# Patient Record
Sex: Female | Born: 1970 | Race: Black or African American | Hispanic: No | Marital: Single | Smoking: Never smoker
Health system: Southern US, Community
[De-identification: ages and names within clinical notes are randomized; demographics above are authoritative.]

## PROBLEM LIST (undated history)

## (undated) ENCOUNTER — Emergency Department (HOSPITAL_COMMUNITY): Admission: EM | Payer: Medicare Other

## (undated) DIAGNOSIS — I10 Essential (primary) hypertension: Secondary | ICD-10-CM

## (undated) DIAGNOSIS — R109 Unspecified abdominal pain: Secondary | ICD-10-CM

## (undated) DIAGNOSIS — E669 Obesity, unspecified: Secondary | ICD-10-CM

## (undated) DIAGNOSIS — S270XXA Traumatic pneumothorax, initial encounter: Secondary | ICD-10-CM

## (undated) DIAGNOSIS — B451 Cerebral cryptococcosis: Secondary | ICD-10-CM

## (undated) DIAGNOSIS — D500 Iron deficiency anemia secondary to blood loss (chronic): Secondary | ICD-10-CM

## (undated) DIAGNOSIS — M1990 Unspecified osteoarthritis, unspecified site: Secondary | ICD-10-CM

## (undated) DIAGNOSIS — F32A Depression, unspecified: Secondary | ICD-10-CM

## (undated) DIAGNOSIS — B20 Human immunodeficiency virus [HIV] disease: Secondary | ICD-10-CM

## (undated) DIAGNOSIS — F419 Anxiety disorder, unspecified: Secondary | ICD-10-CM

## (undated) DIAGNOSIS — Z9289 Personal history of other medical treatment: Secondary | ICD-10-CM

## (undated) DIAGNOSIS — N189 Chronic kidney disease, unspecified: Secondary | ICD-10-CM

## (undated) DIAGNOSIS — F329 Major depressive disorder, single episode, unspecified: Secondary | ICD-10-CM

## (undated) DIAGNOSIS — G43909 Migraine, unspecified, not intractable, without status migrainosus: Secondary | ICD-10-CM

## (undated) DIAGNOSIS — L739 Follicular disorder, unspecified: Secondary | ICD-10-CM

## (undated) DIAGNOSIS — N926 Irregular menstruation, unspecified: Secondary | ICD-10-CM

## (undated) HISTORY — DX: Follicular disorder, unspecified: L73.9

## (undated) HISTORY — PX: WISDOM TOOTH EXTRACTION: SHX21

## (undated) HISTORY — DX: Essential (primary) hypertension: I10

## (undated) HISTORY — DX: Irregular menstruation, unspecified: N92.6

## (undated) HISTORY — DX: Cerebral cryptococcosis: B45.1

## (undated) HISTORY — PX: HYSTEROSCOPY WITH D & C: SHX1775

## (undated) HISTORY — DX: Iron deficiency anemia secondary to blood loss (chronic): D50.0

## (undated) HISTORY — PX: COLONOSCOPY: SHX174

## (undated) HISTORY — DX: Unspecified osteoarthritis, unspecified site: M19.90

## (undated) HISTORY — DX: Unspecified abdominal pain: R10.9

## (undated) HISTORY — DX: Obesity, unspecified: E66.9

## (undated) HISTORY — DX: Human immunodeficiency virus (HIV) disease: B20

## (undated) HISTORY — DX: Chronic kidney disease, unspecified: N18.9

## (undated) HISTORY — PX: CHEST TUBE INSERTION: SHX231

## (undated) HISTORY — DX: Traumatic pneumothorax, initial encounter: S27.0XXA

## (undated) HISTORY — PX: TUBAL LIGATION: SHX77

## (undated) HISTORY — PX: HEMORRHOID BANDING: SHX5850

---

## 1996-06-07 ENCOUNTER — Encounter (INDEPENDENT_AMBULATORY_CARE_PROVIDER_SITE_OTHER): Payer: Self-pay | Admitting: *Deleted

## 1996-06-07 LAB — CONVERTED CEMR LAB
CD4 Count: 9 microliters
CD4 T Cell Abs: 9

## 1997-10-23 ENCOUNTER — Encounter: Admission: RE | Admit: 1997-10-23 | Discharge: 1997-10-23 | Payer: Self-pay | Admitting: Infectious Diseases

## 1997-11-17 ENCOUNTER — Emergency Department (HOSPITAL_COMMUNITY): Admission: AD | Admit: 1997-11-17 | Discharge: 1997-11-17 | Payer: Self-pay | Admitting: *Deleted

## 1997-12-14 ENCOUNTER — Emergency Department (HOSPITAL_COMMUNITY): Admission: EM | Admit: 1997-12-14 | Discharge: 1997-12-14 | Payer: Self-pay | Admitting: *Deleted

## 1997-12-16 ENCOUNTER — Encounter: Admission: RE | Admit: 1997-12-16 | Discharge: 1997-12-16 | Payer: Self-pay | Admitting: Infectious Diseases

## 1998-01-06 ENCOUNTER — Encounter: Admission: RE | Admit: 1998-01-06 | Discharge: 1998-01-06 | Payer: Self-pay | Admitting: Infectious Diseases

## 1998-02-14 ENCOUNTER — Encounter: Admission: RE | Admit: 1998-02-14 | Discharge: 1998-02-14 | Payer: Self-pay | Admitting: Infectious Diseases

## 1998-02-14 ENCOUNTER — Encounter: Payer: Self-pay | Admitting: Infectious Diseases

## 1998-02-14 ENCOUNTER — Ambulatory Visit (HOSPITAL_COMMUNITY): Admission: RE | Admit: 1998-02-14 | Discharge: 1998-02-14 | Payer: Self-pay | Admitting: *Deleted

## 1998-02-15 ENCOUNTER — Emergency Department (HOSPITAL_COMMUNITY): Admission: EM | Admit: 1998-02-15 | Discharge: 1998-02-15 | Payer: Self-pay | Admitting: Emergency Medicine

## 1998-02-17 ENCOUNTER — Encounter: Admission: RE | Admit: 1998-02-17 | Discharge: 1998-02-17 | Payer: Self-pay | Admitting: Infectious Diseases

## 1998-03-10 ENCOUNTER — Encounter: Admission: RE | Admit: 1998-03-10 | Discharge: 1998-03-10 | Payer: Self-pay | Admitting: Infectious Diseases

## 1998-03-25 ENCOUNTER — Encounter: Admission: RE | Admit: 1998-03-25 | Discharge: 1998-03-25 | Payer: Self-pay | Admitting: Infectious Diseases

## 1998-04-10 ENCOUNTER — Ambulatory Visit (HOSPITAL_COMMUNITY): Admission: RE | Admit: 1998-04-10 | Discharge: 1998-04-10 | Payer: Self-pay | Admitting: Infectious Diseases

## 1998-04-21 ENCOUNTER — Encounter: Admission: RE | Admit: 1998-04-21 | Discharge: 1998-04-21 | Payer: Self-pay | Admitting: Infectious Diseases

## 1998-05-22 ENCOUNTER — Encounter: Admission: RE | Admit: 1998-05-22 | Discharge: 1998-05-22 | Payer: Self-pay | Admitting: Infectious Diseases

## 1998-05-28 ENCOUNTER — Encounter: Admission: RE | Admit: 1998-05-28 | Discharge: 1998-05-28 | Payer: Self-pay | Admitting: Infectious Diseases

## 1998-06-11 ENCOUNTER — Encounter: Payer: Self-pay | Admitting: Infectious Diseases

## 1998-06-11 ENCOUNTER — Encounter: Admission: RE | Admit: 1998-06-11 | Discharge: 1998-06-11 | Payer: Self-pay | Admitting: Infectious Diseases

## 1998-06-11 ENCOUNTER — Ambulatory Visit (HOSPITAL_COMMUNITY): Admission: RE | Admit: 1998-06-11 | Discharge: 1998-06-11 | Payer: Self-pay | Admitting: Infectious Diseases

## 1998-06-27 ENCOUNTER — Encounter: Admission: RE | Admit: 1998-06-27 | Discharge: 1998-06-27 | Payer: Self-pay | Admitting: Infectious Diseases

## 1998-07-12 ENCOUNTER — Emergency Department (HOSPITAL_COMMUNITY): Admission: EM | Admit: 1998-07-12 | Discharge: 1998-07-12 | Payer: Self-pay | Admitting: Emergency Medicine

## 1998-07-14 ENCOUNTER — Ambulatory Visit (HOSPITAL_COMMUNITY): Admission: RE | Admit: 1998-07-14 | Discharge: 1998-07-14 | Payer: Self-pay | Admitting: Infectious Diseases

## 1998-07-14 ENCOUNTER — Encounter: Admission: RE | Admit: 1998-07-14 | Discharge: 1998-07-14 | Payer: Self-pay | Admitting: Infectious Diseases

## 1998-08-04 ENCOUNTER — Emergency Department (HOSPITAL_COMMUNITY): Admission: EM | Admit: 1998-08-04 | Discharge: 1998-08-04 | Payer: Self-pay

## 1998-08-14 ENCOUNTER — Emergency Department (HOSPITAL_COMMUNITY): Admission: EM | Admit: 1998-08-14 | Discharge: 1998-08-14 | Payer: Self-pay | Admitting: Emergency Medicine

## 1998-08-15 ENCOUNTER — Encounter: Payer: Self-pay | Admitting: Emergency Medicine

## 1998-08-21 ENCOUNTER — Other Ambulatory Visit: Admission: RE | Admit: 1998-08-21 | Discharge: 1998-08-21 | Payer: Self-pay | Admitting: Obstetrics

## 1998-09-12 ENCOUNTER — Emergency Department (HOSPITAL_COMMUNITY): Admission: EM | Admit: 1998-09-12 | Discharge: 1998-09-12 | Payer: Self-pay | Admitting: Emergency Medicine

## 1998-10-11 ENCOUNTER — Emergency Department (HOSPITAL_COMMUNITY): Admission: EM | Admit: 1998-10-11 | Discharge: 1998-10-11 | Payer: Self-pay | Admitting: Emergency Medicine

## 1998-10-27 ENCOUNTER — Ambulatory Visit (HOSPITAL_COMMUNITY): Admission: RE | Admit: 1998-10-27 | Discharge: 1998-10-27 | Payer: Self-pay | Admitting: Infectious Diseases

## 1998-10-27 ENCOUNTER — Encounter: Admission: RE | Admit: 1998-10-27 | Discharge: 1998-10-27 | Payer: Self-pay | Admitting: Infectious Diseases

## 1998-11-17 ENCOUNTER — Ambulatory Visit (HOSPITAL_COMMUNITY): Admission: RE | Admit: 1998-11-17 | Discharge: 1998-11-17 | Payer: Self-pay | Admitting: Infectious Diseases

## 1998-11-17 ENCOUNTER — Encounter: Admission: RE | Admit: 1998-11-17 | Discharge: 1998-11-17 | Payer: Self-pay | Admitting: Infectious Diseases

## 1998-11-18 ENCOUNTER — Encounter: Admission: RE | Admit: 1998-11-18 | Discharge: 1998-11-18 | Payer: Self-pay | Admitting: Infectious Diseases

## 1998-12-17 ENCOUNTER — Encounter: Admission: RE | Admit: 1998-12-17 | Discharge: 1998-12-17 | Payer: Self-pay | Admitting: Infectious Diseases

## 1999-01-05 ENCOUNTER — Encounter: Admission: RE | Admit: 1999-01-05 | Discharge: 1999-01-05 | Payer: Self-pay | Admitting: Infectious Diseases

## 1999-01-14 ENCOUNTER — Emergency Department (HOSPITAL_COMMUNITY): Admission: EM | Admit: 1999-01-14 | Discharge: 1999-01-14 | Payer: Self-pay

## 1999-01-22 ENCOUNTER — Emergency Department (HOSPITAL_COMMUNITY): Admission: EM | Admit: 1999-01-22 | Discharge: 1999-01-22 | Payer: Self-pay | Admitting: Emergency Medicine

## 1999-02-10 ENCOUNTER — Encounter: Admission: RE | Admit: 1999-02-10 | Discharge: 1999-02-10 | Payer: Self-pay | Admitting: Family Medicine

## 1999-02-10 ENCOUNTER — Emergency Department (HOSPITAL_COMMUNITY): Admission: EM | Admit: 1999-02-10 | Discharge: 1999-02-10 | Payer: Self-pay | Admitting: Emergency Medicine

## 1999-02-17 ENCOUNTER — Encounter: Admission: RE | Admit: 1999-02-17 | Discharge: 1999-02-17 | Payer: Self-pay | Admitting: Infectious Diseases

## 1999-02-17 ENCOUNTER — Ambulatory Visit (HOSPITAL_COMMUNITY): Admission: RE | Admit: 1999-02-17 | Discharge: 1999-02-17 | Payer: Self-pay | Admitting: Infectious Diseases

## 1999-02-24 ENCOUNTER — Emergency Department (HOSPITAL_COMMUNITY): Admission: EM | Admit: 1999-02-24 | Discharge: 1999-02-24 | Payer: Self-pay | Admitting: Emergency Medicine

## 1999-02-24 ENCOUNTER — Encounter: Payer: Self-pay | Admitting: Emergency Medicine

## 1999-03-04 ENCOUNTER — Encounter: Admission: RE | Admit: 1999-03-04 | Discharge: 1999-03-04 | Payer: Self-pay | Admitting: Infectious Diseases

## 1999-03-17 ENCOUNTER — Encounter: Admission: RE | Admit: 1999-03-17 | Discharge: 1999-03-17 | Payer: Self-pay | Admitting: Infectious Diseases

## 1999-03-18 ENCOUNTER — Encounter: Admission: RE | Admit: 1999-03-18 | Discharge: 1999-03-18 | Payer: Self-pay | Admitting: Obstetrics & Gynecology

## 1999-04-01 ENCOUNTER — Encounter: Admission: RE | Admit: 1999-04-01 | Discharge: 1999-04-01 | Payer: Self-pay | Admitting: Obstetrics & Gynecology

## 1999-04-15 ENCOUNTER — Inpatient Hospital Stay (HOSPITAL_COMMUNITY): Admission: RE | Admit: 1999-04-15 | Discharge: 1999-04-15 | Payer: Self-pay | Admitting: *Deleted

## 1999-04-15 ENCOUNTER — Inpatient Hospital Stay (HOSPITAL_COMMUNITY): Admission: AD | Admit: 1999-04-15 | Discharge: 1999-04-15 | Payer: Self-pay | Admitting: *Deleted

## 1999-04-22 ENCOUNTER — Encounter: Admission: RE | Admit: 1999-04-22 | Discharge: 1999-04-22 | Payer: Self-pay | Admitting: Obstetrics & Gynecology

## 1999-04-28 ENCOUNTER — Encounter: Payer: Self-pay | Admitting: *Deleted

## 1999-04-28 ENCOUNTER — Inpatient Hospital Stay (HOSPITAL_COMMUNITY): Admission: AD | Admit: 1999-04-28 | Discharge: 1999-04-28 | Payer: Self-pay | Admitting: *Deleted

## 1999-05-13 ENCOUNTER — Encounter: Admission: RE | Admit: 1999-05-13 | Discharge: 1999-05-13 | Payer: Self-pay | Admitting: Obstetrics & Gynecology

## 1999-05-26 ENCOUNTER — Ambulatory Visit (HOSPITAL_COMMUNITY): Admission: RE | Admit: 1999-05-26 | Discharge: 1999-05-26 | Payer: Self-pay | Admitting: Infectious Diseases

## 1999-05-26 ENCOUNTER — Encounter: Admission: RE | Admit: 1999-05-26 | Discharge: 1999-05-26 | Payer: Self-pay | Admitting: Infectious Diseases

## 1999-06-03 ENCOUNTER — Encounter: Admission: RE | Admit: 1999-06-03 | Discharge: 1999-06-03 | Payer: Self-pay | Admitting: Obstetrics

## 1999-06-09 ENCOUNTER — Ambulatory Visit (HOSPITAL_COMMUNITY): Admission: RE | Admit: 1999-06-09 | Discharge: 1999-06-09 | Payer: Self-pay | Admitting: Obstetrics

## 1999-06-24 ENCOUNTER — Encounter: Admission: RE | Admit: 1999-06-24 | Discharge: 1999-06-24 | Payer: Self-pay | Admitting: Obstetrics & Gynecology

## 1999-06-28 ENCOUNTER — Inpatient Hospital Stay (HOSPITAL_COMMUNITY): Admission: AD | Admit: 1999-06-28 | Discharge: 1999-06-28 | Payer: Self-pay | Admitting: Obstetrics

## 1999-07-01 ENCOUNTER — Encounter: Admission: RE | Admit: 1999-07-01 | Discharge: 1999-07-01 | Payer: Self-pay | Admitting: Obstetrics & Gynecology

## 1999-07-11 ENCOUNTER — Inpatient Hospital Stay (HOSPITAL_COMMUNITY): Admission: AD | Admit: 1999-07-11 | Discharge: 1999-07-11 | Payer: Self-pay | Admitting: Obstetrics

## 1999-07-13 ENCOUNTER — Encounter: Admission: RE | Admit: 1999-07-13 | Discharge: 1999-07-13 | Payer: Self-pay | Admitting: Internal Medicine

## 1999-07-15 ENCOUNTER — Encounter: Admission: RE | Admit: 1999-07-15 | Discharge: 1999-07-15 | Payer: Self-pay | Admitting: Infectious Diseases

## 1999-07-22 ENCOUNTER — Encounter: Admission: RE | Admit: 1999-07-22 | Discharge: 1999-07-22 | Payer: Self-pay | Admitting: Obstetrics & Gynecology

## 1999-08-12 ENCOUNTER — Encounter: Admission: RE | Admit: 1999-08-12 | Discharge: 1999-08-12 | Payer: Self-pay | Admitting: Obstetrics & Gynecology

## 1999-08-19 ENCOUNTER — Inpatient Hospital Stay (HOSPITAL_COMMUNITY): Admission: AD | Admit: 1999-08-19 | Discharge: 1999-08-22 | Payer: Self-pay | Admitting: Obstetrics

## 1999-08-19 ENCOUNTER — Encounter: Payer: Self-pay | Admitting: Obstetrics

## 1999-09-02 ENCOUNTER — Encounter: Admission: RE | Admit: 1999-09-02 | Discharge: 1999-09-02 | Payer: Self-pay | Admitting: Obstetrics & Gynecology

## 1999-09-02 ENCOUNTER — Encounter (HOSPITAL_COMMUNITY): Admission: RE | Admit: 1999-09-02 | Discharge: 1999-10-16 | Payer: Self-pay | Admitting: Obstetrics & Gynecology

## 1999-09-04 ENCOUNTER — Encounter: Admission: RE | Admit: 1999-09-04 | Discharge: 1999-12-03 | Payer: Self-pay | Admitting: Obstetrics

## 1999-09-14 ENCOUNTER — Encounter: Admission: RE | Admit: 1999-09-14 | Discharge: 1999-09-14 | Payer: Self-pay | Admitting: Infectious Diseases

## 1999-09-16 ENCOUNTER — Encounter: Admission: RE | Admit: 1999-09-16 | Discharge: 1999-09-16 | Payer: Self-pay | Admitting: Obstetrics & Gynecology

## 1999-09-16 ENCOUNTER — Observation Stay (HOSPITAL_COMMUNITY): Admission: AD | Admit: 1999-09-16 | Discharge: 1999-09-17 | Payer: Self-pay | Admitting: Obstetrics

## 1999-09-29 ENCOUNTER — Inpatient Hospital Stay (HOSPITAL_COMMUNITY): Admission: AD | Admit: 1999-09-29 | Discharge: 1999-09-29 | Payer: Self-pay | Admitting: *Deleted

## 1999-10-02 ENCOUNTER — Inpatient Hospital Stay (HOSPITAL_COMMUNITY): Admission: AD | Admit: 1999-10-02 | Discharge: 1999-10-02 | Payer: Self-pay | Admitting: Obstetrics & Gynecology

## 1999-10-04 ENCOUNTER — Inpatient Hospital Stay (HOSPITAL_COMMUNITY): Admission: AD | Admit: 1999-10-04 | Discharge: 1999-10-04 | Payer: Self-pay | Admitting: Obstetrics & Gynecology

## 1999-10-04 ENCOUNTER — Encounter: Payer: Self-pay | Admitting: Obstetrics & Gynecology

## 1999-10-14 ENCOUNTER — Inpatient Hospital Stay (HOSPITAL_COMMUNITY): Admission: AD | Admit: 1999-10-14 | Discharge: 1999-10-17 | Payer: Self-pay | Admitting: Obstetrics

## 1999-10-19 ENCOUNTER — Inpatient Hospital Stay (HOSPITAL_COMMUNITY): Admission: AD | Admit: 1999-10-19 | Discharge: 1999-10-19 | Payer: Self-pay | Admitting: Obstetrics

## 1999-10-21 ENCOUNTER — Inpatient Hospital Stay (HOSPITAL_COMMUNITY): Admission: AD | Admit: 1999-10-21 | Discharge: 1999-10-21 | Payer: Self-pay | Admitting: Obstetrics

## 1999-10-21 ENCOUNTER — Emergency Department (HOSPITAL_COMMUNITY): Admission: EM | Admit: 1999-10-21 | Discharge: 1999-10-21 | Payer: Self-pay | Admitting: Emergency Medicine

## 1999-11-16 ENCOUNTER — Encounter: Admission: RE | Admit: 1999-11-16 | Discharge: 1999-11-16 | Payer: Self-pay | Admitting: Infectious Diseases

## 1999-11-16 ENCOUNTER — Ambulatory Visit (HOSPITAL_COMMUNITY): Admission: RE | Admit: 1999-11-16 | Discharge: 1999-11-16 | Payer: Self-pay | Admitting: Infectious Diseases

## 2000-01-11 ENCOUNTER — Encounter: Admission: RE | Admit: 2000-01-11 | Discharge: 2000-01-11 | Payer: Self-pay | Admitting: Infectious Diseases

## 2000-02-10 ENCOUNTER — Ambulatory Visit (HOSPITAL_COMMUNITY): Admission: RE | Admit: 2000-02-10 | Discharge: 2000-02-10 | Payer: Self-pay | Admitting: Infectious Diseases

## 2000-02-10 ENCOUNTER — Encounter: Admission: RE | Admit: 2000-02-10 | Discharge: 2000-02-10 | Payer: Self-pay | Admitting: Infectious Diseases

## 2000-02-24 ENCOUNTER — Encounter: Admission: RE | Admit: 2000-02-24 | Discharge: 2000-02-24 | Payer: Self-pay | Admitting: Infectious Diseases

## 2000-05-13 ENCOUNTER — Emergency Department (HOSPITAL_COMMUNITY): Admission: EM | Admit: 2000-05-13 | Discharge: 2000-05-13 | Payer: Self-pay | Admitting: Emergency Medicine

## 2000-05-13 ENCOUNTER — Encounter: Payer: Self-pay | Admitting: Emergency Medicine

## 2000-07-25 ENCOUNTER — Ambulatory Visit (HOSPITAL_COMMUNITY): Admission: RE | Admit: 2000-07-25 | Discharge: 2000-07-25 | Payer: Self-pay | Admitting: Infectious Diseases

## 2000-07-25 ENCOUNTER — Encounter: Admission: RE | Admit: 2000-07-25 | Discharge: 2000-07-25 | Payer: Self-pay | Admitting: Infectious Diseases

## 2000-10-06 ENCOUNTER — Inpatient Hospital Stay (HOSPITAL_COMMUNITY): Admission: AD | Admit: 2000-10-06 | Discharge: 2000-10-06 | Payer: Self-pay | Admitting: Obstetrics & Gynecology

## 2000-10-10 ENCOUNTER — Encounter: Admission: RE | Admit: 2000-10-10 | Discharge: 2000-10-10 | Payer: Self-pay | Admitting: Infectious Diseases

## 2000-10-17 ENCOUNTER — Encounter: Admission: RE | Admit: 2000-10-17 | Discharge: 2001-01-15 | Payer: Self-pay | Admitting: Infectious Diseases

## 2000-11-05 ENCOUNTER — Emergency Department (HOSPITAL_COMMUNITY): Admission: EM | Admit: 2000-11-05 | Discharge: 2000-11-05 | Payer: Self-pay | Admitting: Emergency Medicine

## 2000-12-23 ENCOUNTER — Emergency Department (HOSPITAL_COMMUNITY): Admission: EM | Admit: 2000-12-23 | Discharge: 2000-12-24 | Payer: Self-pay | Admitting: Emergency Medicine

## 2001-01-30 ENCOUNTER — Encounter: Admission: RE | Admit: 2001-01-30 | Discharge: 2001-01-30 | Payer: Self-pay | Admitting: Infectious Diseases

## 2001-01-30 ENCOUNTER — Ambulatory Visit (HOSPITAL_COMMUNITY): Admission: RE | Admit: 2001-01-30 | Discharge: 2001-01-30 | Payer: Self-pay | Admitting: Infectious Diseases

## 2001-02-13 ENCOUNTER — Encounter: Admission: RE | Admit: 2001-02-13 | Discharge: 2001-02-13 | Payer: Self-pay | Admitting: Infectious Diseases

## 2001-03-01 ENCOUNTER — Encounter: Admission: RE | Admit: 2001-03-01 | Discharge: 2001-03-01 | Payer: Self-pay | Admitting: Internal Medicine

## 2001-07-03 ENCOUNTER — Ambulatory Visit (HOSPITAL_COMMUNITY): Admission: RE | Admit: 2001-07-03 | Discharge: 2001-07-03 | Payer: Self-pay | Admitting: Infectious Diseases

## 2001-07-03 ENCOUNTER — Encounter: Admission: RE | Admit: 2001-07-03 | Discharge: 2001-07-03 | Payer: Self-pay | Admitting: Internal Medicine

## 2001-07-17 ENCOUNTER — Encounter: Admission: RE | Admit: 2001-07-17 | Discharge: 2001-07-17 | Payer: Self-pay | Admitting: Infectious Diseases

## 2001-09-25 ENCOUNTER — Encounter: Payer: Self-pay | Admitting: *Deleted

## 2001-09-25 ENCOUNTER — Emergency Department (HOSPITAL_COMMUNITY): Admission: EM | Admit: 2001-09-25 | Discharge: 2001-09-25 | Payer: Self-pay | Admitting: *Deleted

## 2001-10-27 ENCOUNTER — Encounter: Payer: Self-pay | Admitting: Emergency Medicine

## 2001-10-27 ENCOUNTER — Emergency Department (HOSPITAL_COMMUNITY): Admission: EM | Admit: 2001-10-27 | Discharge: 2001-10-27 | Payer: Self-pay | Admitting: *Deleted

## 2001-10-30 ENCOUNTER — Emergency Department (HOSPITAL_COMMUNITY): Admission: EM | Admit: 2001-10-30 | Discharge: 2001-10-30 | Payer: Self-pay | Admitting: Emergency Medicine

## 2001-10-30 ENCOUNTER — Encounter: Payer: Self-pay | Admitting: Emergency Medicine

## 2001-11-09 ENCOUNTER — Encounter: Admission: RE | Admit: 2001-11-09 | Discharge: 2001-11-09 | Payer: Self-pay | Admitting: Infectious Diseases

## 2001-11-09 ENCOUNTER — Ambulatory Visit (HOSPITAL_COMMUNITY): Admission: RE | Admit: 2001-11-09 | Discharge: 2001-11-09 | Payer: Self-pay | Admitting: Infectious Diseases

## 2001-12-07 ENCOUNTER — Emergency Department (HOSPITAL_COMMUNITY): Admission: EM | Admit: 2001-12-07 | Discharge: 2001-12-07 | Payer: Self-pay

## 2001-12-11 ENCOUNTER — Encounter: Admission: RE | Admit: 2001-12-11 | Discharge: 2001-12-11 | Payer: Self-pay | Admitting: Infectious Diseases

## 2001-12-23 ENCOUNTER — Emergency Department (HOSPITAL_COMMUNITY): Admission: EM | Admit: 2001-12-23 | Discharge: 2001-12-24 | Payer: Self-pay | Admitting: Emergency Medicine

## 2002-01-30 ENCOUNTER — Encounter: Admission: RE | Admit: 2002-01-30 | Discharge: 2002-01-30 | Payer: Self-pay | Admitting: Infectious Diseases

## 2002-03-26 ENCOUNTER — Ambulatory Visit (HOSPITAL_COMMUNITY): Admission: RE | Admit: 2002-03-26 | Discharge: 2002-03-26 | Payer: Self-pay | Admitting: Infectious Diseases

## 2002-03-26 ENCOUNTER — Encounter: Admission: RE | Admit: 2002-03-26 | Discharge: 2002-03-26 | Payer: Self-pay | Admitting: Infectious Diseases

## 2002-03-29 ENCOUNTER — Encounter: Admission: RE | Admit: 2002-03-29 | Discharge: 2002-04-19 | Payer: Self-pay | Admitting: Family Medicine

## 2002-05-07 ENCOUNTER — Encounter: Admission: RE | Admit: 2002-05-07 | Discharge: 2002-05-07 | Payer: Self-pay | Admitting: Infectious Diseases

## 2002-08-01 ENCOUNTER — Encounter: Admission: RE | Admit: 2002-08-01 | Discharge: 2002-08-01 | Payer: Self-pay | Admitting: Infectious Diseases

## 2002-08-20 ENCOUNTER — Encounter: Admission: RE | Admit: 2002-08-20 | Discharge: 2002-08-20 | Payer: Self-pay | Admitting: Infectious Diseases

## 2002-12-29 ENCOUNTER — Encounter: Payer: Self-pay | Admitting: Family Medicine

## 2002-12-29 ENCOUNTER — Encounter: Admission: RE | Admit: 2002-12-29 | Discharge: 2002-12-29 | Payer: Self-pay | Admitting: Family Medicine

## 2003-01-01 ENCOUNTER — Encounter: Admission: RE | Admit: 2003-01-01 | Discharge: 2003-01-01 | Payer: Self-pay | Admitting: Infectious Diseases

## 2003-01-01 ENCOUNTER — Encounter (INDEPENDENT_AMBULATORY_CARE_PROVIDER_SITE_OTHER): Payer: Self-pay | Admitting: Infectious Diseases

## 2003-01-01 ENCOUNTER — Ambulatory Visit (HOSPITAL_COMMUNITY): Admission: RE | Admit: 2003-01-01 | Discharge: 2003-01-01 | Payer: Self-pay | Admitting: Infectious Diseases

## 2003-01-21 ENCOUNTER — Encounter: Admission: RE | Admit: 2003-01-21 | Discharge: 2003-01-21 | Payer: Self-pay | Admitting: Infectious Diseases

## 2003-02-08 ENCOUNTER — Encounter: Admission: RE | Admit: 2003-02-08 | Discharge: 2003-02-08 | Payer: Self-pay | Admitting: Infectious Diseases

## 2003-02-13 ENCOUNTER — Encounter: Admission: RE | Admit: 2003-02-13 | Discharge: 2003-02-13 | Payer: Self-pay | Admitting: Infectious Diseases

## 2003-03-18 ENCOUNTER — Encounter: Payer: Self-pay | Admitting: Emergency Medicine

## 2003-03-18 ENCOUNTER — Emergency Department (HOSPITAL_COMMUNITY): Admission: EM | Admit: 2003-03-18 | Discharge: 2003-03-19 | Payer: Self-pay | Admitting: Emergency Medicine

## 2003-03-19 ENCOUNTER — Encounter: Payer: Self-pay | Admitting: Emergency Medicine

## 2003-03-27 ENCOUNTER — Emergency Department (HOSPITAL_COMMUNITY): Admission: EM | Admit: 2003-03-27 | Discharge: 2003-03-27 | Payer: Self-pay | Admitting: Emergency Medicine

## 2003-03-27 ENCOUNTER — Encounter: Payer: Self-pay | Admitting: Emergency Medicine

## 2003-04-29 ENCOUNTER — Encounter: Admission: RE | Admit: 2003-04-29 | Discharge: 2003-04-29 | Payer: Self-pay | Admitting: Family Medicine

## 2003-06-26 ENCOUNTER — Encounter: Admission: RE | Admit: 2003-06-26 | Discharge: 2003-06-26 | Payer: Self-pay | Admitting: Infectious Diseases

## 2003-07-10 ENCOUNTER — Encounter: Admission: RE | Admit: 2003-07-10 | Discharge: 2003-07-10 | Payer: Self-pay | Admitting: Infectious Diseases

## 2003-07-16 ENCOUNTER — Emergency Department (HOSPITAL_COMMUNITY): Admission: EM | Admit: 2003-07-16 | Discharge: 2003-07-16 | Payer: Self-pay

## 2003-08-08 ENCOUNTER — Emergency Department (HOSPITAL_COMMUNITY): Admission: EM | Admit: 2003-08-08 | Discharge: 2003-08-08 | Payer: Self-pay

## 2003-08-12 ENCOUNTER — Encounter: Admission: RE | Admit: 2003-08-12 | Discharge: 2003-08-12 | Payer: Self-pay | Admitting: Infectious Diseases

## 2003-10-01 ENCOUNTER — Inpatient Hospital Stay (HOSPITAL_COMMUNITY): Admission: AD | Admit: 2003-10-01 | Discharge: 2003-10-01 | Payer: Self-pay | Admitting: Obstetrics

## 2003-10-05 ENCOUNTER — Inpatient Hospital Stay (HOSPITAL_COMMUNITY): Admission: AD | Admit: 2003-10-05 | Discharge: 2003-10-05 | Payer: Self-pay | Admitting: Obstetrics

## 2003-11-04 ENCOUNTER — Encounter: Admission: RE | Admit: 2003-11-04 | Discharge: 2003-11-04 | Payer: Self-pay | Admitting: Infectious Diseases

## 2003-11-04 ENCOUNTER — Ambulatory Visit (HOSPITAL_COMMUNITY): Admission: RE | Admit: 2003-11-04 | Discharge: 2003-11-04 | Payer: Self-pay | Admitting: Infectious Diseases

## 2003-11-18 ENCOUNTER — Encounter: Admission: RE | Admit: 2003-11-18 | Discharge: 2003-11-18 | Payer: Self-pay | Admitting: Infectious Diseases

## 2003-12-10 ENCOUNTER — Emergency Department (HOSPITAL_COMMUNITY): Admission: EM | Admit: 2003-12-10 | Discharge: 2003-12-11 | Payer: Self-pay | Admitting: Emergency Medicine

## 2004-01-09 ENCOUNTER — Encounter: Admission: RE | Admit: 2004-01-09 | Discharge: 2004-01-09 | Payer: Self-pay | Admitting: Family Medicine

## 2004-02-03 ENCOUNTER — Inpatient Hospital Stay (HOSPITAL_COMMUNITY): Admission: AD | Admit: 2004-02-03 | Discharge: 2004-02-03 | Payer: Self-pay | Admitting: Obstetrics

## 2004-03-05 ENCOUNTER — Inpatient Hospital Stay (HOSPITAL_COMMUNITY): Admission: AD | Admit: 2004-03-05 | Discharge: 2004-03-05 | Payer: Self-pay | Admitting: Obstetrics

## 2004-03-09 ENCOUNTER — Ambulatory Visit (HOSPITAL_COMMUNITY): Admission: RE | Admit: 2004-03-09 | Discharge: 2004-03-09 | Payer: Self-pay | Admitting: Infectious Diseases

## 2004-03-09 ENCOUNTER — Ambulatory Visit: Payer: Self-pay | Admitting: Infectious Diseases

## 2004-04-24 ENCOUNTER — Ambulatory Visit: Payer: Self-pay | Admitting: Infectious Diseases

## 2004-05-18 ENCOUNTER — Ambulatory Visit: Payer: Self-pay | Admitting: Infectious Diseases

## 2004-05-31 ENCOUNTER — Inpatient Hospital Stay (HOSPITAL_COMMUNITY): Admission: AD | Admit: 2004-05-31 | Discharge: 2004-05-31 | Payer: Self-pay | Admitting: Obstetrics

## 2004-06-25 ENCOUNTER — Inpatient Hospital Stay (HOSPITAL_COMMUNITY): Admission: AD | Admit: 2004-06-25 | Discharge: 2004-06-25 | Payer: Self-pay | Admitting: Obstetrics

## 2004-06-25 ENCOUNTER — Ambulatory Visit: Payer: Self-pay | Admitting: Family Medicine

## 2004-07-01 ENCOUNTER — Ambulatory Visit: Payer: Self-pay | Admitting: *Deleted

## 2004-07-06 ENCOUNTER — Ambulatory Visit: Payer: Self-pay | Admitting: Internal Medicine

## 2004-07-08 ENCOUNTER — Ambulatory Visit: Payer: Self-pay | Admitting: Infectious Diseases

## 2004-07-08 ENCOUNTER — Ambulatory Visit (HOSPITAL_COMMUNITY): Admission: RE | Admit: 2004-07-08 | Discharge: 2004-07-08 | Payer: Self-pay | Admitting: Infectious Diseases

## 2004-07-09 ENCOUNTER — Ambulatory Visit: Payer: Self-pay | Admitting: Family Medicine

## 2004-07-14 ENCOUNTER — Ambulatory Visit: Payer: Self-pay | Admitting: *Deleted

## 2004-07-22 ENCOUNTER — Ambulatory Visit: Payer: Self-pay | Admitting: *Deleted

## 2004-07-22 ENCOUNTER — Ambulatory Visit: Payer: Self-pay | Admitting: Infectious Diseases

## 2004-07-29 ENCOUNTER — Ambulatory Visit: Payer: Self-pay | Admitting: *Deleted

## 2004-07-29 ENCOUNTER — Ambulatory Visit (HOSPITAL_COMMUNITY): Admission: RE | Admit: 2004-07-29 | Discharge: 2004-07-29 | Payer: Self-pay | Admitting: Obstetrics & Gynecology

## 2004-08-02 ENCOUNTER — Inpatient Hospital Stay (HOSPITAL_COMMUNITY): Admission: AD | Admit: 2004-08-02 | Discharge: 2004-08-04 | Payer: Self-pay | Admitting: Family Medicine

## 2004-08-07 ENCOUNTER — Inpatient Hospital Stay (HOSPITAL_COMMUNITY): Admission: AD | Admit: 2004-08-07 | Discharge: 2004-08-07 | Payer: Self-pay | Admitting: Obstetrics and Gynecology

## 2004-08-12 ENCOUNTER — Ambulatory Visit: Payer: Self-pay | Admitting: *Deleted

## 2004-08-12 ENCOUNTER — Ambulatory Visit: Payer: Self-pay | Admitting: Obstetrics and Gynecology

## 2004-08-12 ENCOUNTER — Inpatient Hospital Stay (HOSPITAL_COMMUNITY): Admission: AD | Admit: 2004-08-12 | Discharge: 2004-08-14 | Payer: Self-pay | Admitting: Obstetrics and Gynecology

## 2004-08-17 ENCOUNTER — Ambulatory Visit: Payer: Self-pay | Admitting: *Deleted

## 2004-08-19 ENCOUNTER — Ambulatory Visit: Payer: Self-pay | Admitting: *Deleted

## 2004-08-20 ENCOUNTER — Inpatient Hospital Stay (HOSPITAL_COMMUNITY): Admission: AD | Admit: 2004-08-20 | Discharge: 2004-08-20 | Payer: Self-pay | Admitting: Obstetrics & Gynecology

## 2004-08-26 ENCOUNTER — Ambulatory Visit: Payer: Self-pay | Admitting: *Deleted

## 2004-09-01 ENCOUNTER — Inpatient Hospital Stay (HOSPITAL_COMMUNITY): Admission: AD | Admit: 2004-09-01 | Discharge: 2004-09-04 | Payer: Self-pay | Admitting: *Deleted

## 2004-09-01 ENCOUNTER — Ambulatory Visit: Payer: Self-pay | Admitting: *Deleted

## 2004-09-09 ENCOUNTER — Ambulatory Visit: Payer: Self-pay | Admitting: Obstetrics & Gynecology

## 2004-09-09 ENCOUNTER — Ambulatory Visit (HOSPITAL_COMMUNITY): Admission: RE | Admit: 2004-09-09 | Discharge: 2004-09-09 | Payer: Self-pay | Admitting: Obstetrics & Gynecology

## 2004-09-11 ENCOUNTER — Ambulatory Visit: Payer: Self-pay | Admitting: Obstetrics & Gynecology

## 2004-09-11 ENCOUNTER — Inpatient Hospital Stay (HOSPITAL_COMMUNITY): Admission: AD | Admit: 2004-09-11 | Discharge: 2004-09-15 | Payer: Self-pay | Admitting: *Deleted

## 2004-09-11 ENCOUNTER — Ambulatory Visit (HOSPITAL_COMMUNITY): Admission: RE | Admit: 2004-09-11 | Discharge: 2004-09-11 | Payer: Self-pay | Admitting: *Deleted

## 2004-09-11 ENCOUNTER — Ambulatory Visit: Payer: Self-pay | Admitting: *Deleted

## 2004-11-14 ENCOUNTER — Emergency Department (HOSPITAL_COMMUNITY): Admission: EM | Admit: 2004-11-14 | Discharge: 2004-11-14 | Payer: Self-pay | Admitting: Emergency Medicine

## 2005-02-15 ENCOUNTER — Ambulatory Visit: Payer: Self-pay | Admitting: Infectious Diseases

## 2005-02-15 ENCOUNTER — Ambulatory Visit (HOSPITAL_COMMUNITY): Admission: RE | Admit: 2005-02-15 | Discharge: 2005-02-15 | Payer: Self-pay | Admitting: Infectious Diseases

## 2005-03-08 ENCOUNTER — Ambulatory Visit: Payer: Self-pay | Admitting: Infectious Diseases

## 2005-03-25 ENCOUNTER — Encounter: Admission: RE | Admit: 2005-03-25 | Discharge: 2005-03-25 | Payer: Self-pay | Admitting: Family Medicine

## 2005-04-20 ENCOUNTER — Ambulatory Visit (HOSPITAL_COMMUNITY): Admission: RE | Admit: 2005-04-20 | Discharge: 2005-04-20 | Payer: Self-pay

## 2005-04-21 ENCOUNTER — Emergency Department (HOSPITAL_COMMUNITY): Admission: EM | Admit: 2005-04-21 | Discharge: 2005-04-21 | Payer: Self-pay | Admitting: Emergency Medicine

## 2005-05-05 ENCOUNTER — Ambulatory Visit (HOSPITAL_COMMUNITY): Admission: RE | Admit: 2005-05-05 | Discharge: 2005-05-05 | Payer: Self-pay | Admitting: Obstetrics

## 2005-06-30 ENCOUNTER — Encounter (INDEPENDENT_AMBULATORY_CARE_PROVIDER_SITE_OTHER): Payer: Self-pay | Admitting: *Deleted

## 2005-06-30 ENCOUNTER — Ambulatory Visit: Payer: Self-pay | Admitting: Infectious Diseases

## 2005-06-30 LAB — CONVERTED CEMR LAB: HIV 1 RNA Quant: 49 copies/mL

## 2005-07-14 ENCOUNTER — Ambulatory Visit: Payer: Self-pay | Admitting: Infectious Diseases

## 2005-10-23 ENCOUNTER — Encounter
Admission: RE | Admit: 2005-10-23 | Discharge: 2005-10-23 | Payer: Self-pay | Admitting: Physical Medicine and Rehabilitation

## 2005-10-25 ENCOUNTER — Ambulatory Visit (HOSPITAL_COMMUNITY)
Admission: RE | Admit: 2005-10-25 | Discharge: 2005-10-25 | Payer: Self-pay | Admitting: Physical Medicine and Rehabilitation

## 2005-10-25 ENCOUNTER — Encounter
Admission: RE | Admit: 2005-10-25 | Discharge: 2005-10-25 | Payer: Self-pay | Admitting: Physical Medicine and Rehabilitation

## 2005-12-23 ENCOUNTER — Encounter (INDEPENDENT_AMBULATORY_CARE_PROVIDER_SITE_OTHER): Payer: Self-pay | Admitting: *Deleted

## 2005-12-23 ENCOUNTER — Encounter: Admission: RE | Admit: 2005-12-23 | Discharge: 2005-12-23 | Payer: Self-pay | Admitting: Infectious Diseases

## 2005-12-23 ENCOUNTER — Ambulatory Visit: Payer: Self-pay | Admitting: Infectious Diseases

## 2005-12-23 LAB — CONVERTED CEMR LAB: HIV 1 RNA Quant: 49 copies/mL

## 2006-01-10 ENCOUNTER — Ambulatory Visit: Payer: Self-pay | Admitting: Infectious Diseases

## 2006-03-02 ENCOUNTER — Emergency Department (HOSPITAL_COMMUNITY): Admission: EM | Admit: 2006-03-02 | Discharge: 2006-03-02 | Payer: Self-pay | Admitting: Emergency Medicine

## 2006-03-11 DIAGNOSIS — L738 Other specified follicular disorders: Secondary | ICD-10-CM

## 2006-03-11 DIAGNOSIS — J019 Acute sinusitis, unspecified: Secondary | ICD-10-CM

## 2006-03-11 DIAGNOSIS — G43909 Migraine, unspecified, not intractable, without status migrainosus: Secondary | ICD-10-CM | POA: Insufficient documentation

## 2006-03-11 DIAGNOSIS — B451 Cerebral cryptococcosis: Secondary | ICD-10-CM | POA: Insufficient documentation

## 2006-03-11 DIAGNOSIS — G02 Meningitis in other infectious and parasitic diseases classified elsewhere: Secondary | ICD-10-CM

## 2006-03-11 DIAGNOSIS — B20 Human immunodeficiency virus [HIV] disease: Secondary | ICD-10-CM | POA: Insufficient documentation

## 2006-03-11 DIAGNOSIS — R7881 Bacteremia: Secondary | ICD-10-CM | POA: Insufficient documentation

## 2006-05-05 ENCOUNTER — Encounter (INDEPENDENT_AMBULATORY_CARE_PROVIDER_SITE_OTHER): Payer: Self-pay | Admitting: *Deleted

## 2006-05-05 ENCOUNTER — Ambulatory Visit: Payer: Self-pay | Admitting: Infectious Diseases

## 2006-05-05 ENCOUNTER — Encounter: Admission: RE | Admit: 2006-05-05 | Discharge: 2006-05-05 | Payer: Self-pay | Admitting: Infectious Diseases

## 2006-05-05 ENCOUNTER — Encounter (INDEPENDENT_AMBULATORY_CARE_PROVIDER_SITE_OTHER): Payer: Self-pay | Admitting: Infectious Diseases

## 2006-05-05 LAB — CONVERTED CEMR LAB
ALT: 9 units/L (ref 0–35)
Albumin: 4.3 g/dL (ref 3.5–5.2)
Basophils Absolute: 0 10*3/uL (ref 0.0–0.1)
CO2: 24 meq/L (ref 19–32)
Chloride: 103 meq/L (ref 96–112)
Glucose, Bld: 196 mg/dL — ABNORMAL HIGH (ref 70–99)
HIV 1 RNA Quant: <50 copies/mL (ref <50)
Lymphocytes Relative: 40 % (ref 15–43)
MCHC: 30.3 g/dL — ABNORMAL LOW (ref 33.1–35.4)
Monocytes Absolute: 0.4 10*3/uL (ref 0.2–0.7)
Neutro Abs: 2.7 10*3/uL (ref 1.8–6.8)
Neutrophils Relative %: 49 % (ref 47–77)
Platelets: 453 10*3/uL — ABNORMAL HIGH (ref 152–374)
Potassium: 4.1 meq/L (ref 3.5–5.3)
RDW: 14.7 % (ref 11.5–15.3)
Sodium: 138 meq/L (ref 135–145)
Total Bilirubin: 0.2 mg/dL — ABNORMAL LOW (ref 0.3–1.2)
Total Protein: 7.4 g/dL (ref 6.0–8.3)

## 2006-05-07 ENCOUNTER — Emergency Department (HOSPITAL_COMMUNITY): Admission: EM | Admit: 2006-05-07 | Discharge: 2006-05-07 | Payer: Self-pay | Admitting: Emergency Medicine

## 2006-05-08 ENCOUNTER — Emergency Department (HOSPITAL_COMMUNITY): Admission: EM | Admit: 2006-05-08 | Discharge: 2006-05-08 | Payer: Self-pay | Admitting: Emergency Medicine

## 2006-05-23 ENCOUNTER — Ambulatory Visit: Payer: Self-pay | Admitting: Infectious Diseases

## 2006-06-15 ENCOUNTER — Emergency Department (HOSPITAL_COMMUNITY): Admission: EM | Admit: 2006-06-15 | Discharge: 2006-06-16 | Payer: Self-pay | Admitting: Emergency Medicine

## 2006-07-29 ENCOUNTER — Emergency Department (HOSPITAL_COMMUNITY): Admission: EM | Admit: 2006-07-29 | Discharge: 2006-07-29 | Payer: Self-pay | Admitting: Emergency Medicine

## 2006-08-01 ENCOUNTER — Telehealth: Payer: Self-pay | Admitting: Infectious Diseases

## 2006-08-01 ENCOUNTER — Encounter (INDEPENDENT_AMBULATORY_CARE_PROVIDER_SITE_OTHER): Payer: Self-pay | Admitting: *Deleted

## 2006-08-01 LAB — CONVERTED CEMR LAB

## 2006-08-03 ENCOUNTER — Encounter (INDEPENDENT_AMBULATORY_CARE_PROVIDER_SITE_OTHER): Payer: Self-pay | Admitting: Infectious Diseases

## 2006-08-14 ENCOUNTER — Encounter (INDEPENDENT_AMBULATORY_CARE_PROVIDER_SITE_OTHER): Payer: Self-pay | Admitting: *Deleted

## 2006-09-12 ENCOUNTER — Emergency Department (HOSPITAL_COMMUNITY): Admission: EM | Admit: 2006-09-12 | Discharge: 2006-09-12 | Payer: Self-pay | Admitting: Emergency Medicine

## 2007-02-16 ENCOUNTER — Encounter: Admission: RE | Admit: 2007-02-16 | Discharge: 2007-02-16 | Payer: Self-pay | Admitting: Internal Medicine

## 2007-02-16 ENCOUNTER — Ambulatory Visit: Payer: Self-pay | Admitting: Internal Medicine

## 2007-02-16 LAB — CONVERTED CEMR LAB
HIV 1 RNA Quant: <50 copies/mL (ref <50)
HIV-1 RNA Quant, Log: <1.7 (ref <1.70)

## 2007-02-17 ENCOUNTER — Telehealth: Payer: Self-pay | Admitting: Internal Medicine

## 2007-02-17 LAB — CONVERTED CEMR LAB
BUN: 11 mg/dL (ref 6–23)
CO2: 23 meq/L (ref 19–32)
Creatinine, Ser: 0.84 mg/dL (ref 0.40–1.20)
Eosinophils Relative: 5 % (ref 0–5)
Glucose, Bld: 301 mg/dL — ABNORMAL HIGH (ref 70–99)
HCT: 34 % — ABNORMAL LOW (ref 36.0–46.0)
Hemoglobin: 10.2 g/dL — ABNORMAL LOW (ref 12.0–15.0)
Lymphocytes Relative: 39 % (ref 12–46)
Lymphs Abs: 2.2 10*3/uL (ref 0.7–3.3)
MCV: 70.1 fL — ABNORMAL LOW (ref 78.0–100.0)
Monocytes Absolute: 0.4 10*3/uL (ref 0.2–0.7)
Total Bilirubin: 0.4 mg/dL (ref 0.3–1.2)
WBC: 5.5 10*3/uL (ref 4.0–10.5)

## 2007-02-20 DIAGNOSIS — Z794 Long term (current) use of insulin: Secondary | ICD-10-CM

## 2007-02-20 DIAGNOSIS — E119 Type 2 diabetes mellitus without complications: Secondary | ICD-10-CM | POA: Insufficient documentation

## 2007-02-20 DIAGNOSIS — E1165 Type 2 diabetes mellitus with hyperglycemia: Secondary | ICD-10-CM

## 2007-03-08 ENCOUNTER — Ambulatory Visit: Payer: Self-pay | Admitting: Internal Medicine

## 2007-03-09 ENCOUNTER — Emergency Department (HOSPITAL_COMMUNITY): Admission: EM | Admit: 2007-03-09 | Discharge: 2007-03-09 | Payer: Self-pay | Admitting: Emergency Medicine

## 2007-04-05 ENCOUNTER — Emergency Department (HOSPITAL_COMMUNITY): Admission: EM | Admit: 2007-04-05 | Discharge: 2007-04-05 | Payer: Self-pay | Admitting: Emergency Medicine

## 2007-05-07 ENCOUNTER — Emergency Department (HOSPITAL_COMMUNITY): Admission: EM | Admit: 2007-05-07 | Discharge: 2007-05-08 | Payer: Self-pay | Admitting: Emergency Medicine

## 2007-07-03 ENCOUNTER — Encounter: Admission: RE | Admit: 2007-07-03 | Discharge: 2007-07-03 | Payer: Self-pay | Admitting: Family Medicine

## 2007-08-03 ENCOUNTER — Telehealth (INDEPENDENT_AMBULATORY_CARE_PROVIDER_SITE_OTHER): Payer: Self-pay | Admitting: *Deleted

## 2007-08-15 ENCOUNTER — Encounter: Admission: RE | Admit: 2007-08-15 | Discharge: 2007-08-15 | Payer: Self-pay | Admitting: Internal Medicine

## 2007-08-15 ENCOUNTER — Ambulatory Visit: Payer: Self-pay | Admitting: Internal Medicine

## 2007-08-15 LAB — CONVERTED CEMR LAB
ALT: 15 units/L (ref 0–35)
AST: 15 units/L (ref 0–37)
Albumin: 4.2 g/dL (ref 3.5–5.2)
BUN: 8 mg/dL (ref 6–23)
Basophils Relative: 1 % (ref 0–1)
Calcium: 8.9 mg/dL (ref 8.4–10.5)
Chloride: 102 meq/L (ref 96–112)
Lymphs Abs: 1.8 10*3/uL (ref 0.7–4.0)
MCHC: 32 g/dL (ref 30.0–36.0)
Monocytes Relative: 7 % (ref 3–12)
Neutro Abs: 3.8 10*3/uL (ref 1.7–7.7)
Neutrophils Relative %: 59 % (ref 43–77)
Potassium: 4.1 meq/L (ref 3.5–5.3)
RBC: 4.6 M/uL (ref 3.87–5.11)
WBC: 6.5 10*3/uL (ref 4.0–10.5)

## 2007-08-21 ENCOUNTER — Emergency Department (HOSPITAL_COMMUNITY): Admission: EM | Admit: 2007-08-21 | Discharge: 2007-08-21 | Payer: Self-pay | Admitting: Emergency Medicine

## 2007-10-06 ENCOUNTER — Emergency Department (HOSPITAL_COMMUNITY): Admission: EM | Admit: 2007-10-06 | Discharge: 2007-10-06 | Payer: Self-pay | Admitting: Emergency Medicine

## 2007-11-28 ENCOUNTER — Encounter: Admission: RE | Admit: 2007-11-28 | Discharge: 2007-11-28 | Payer: Self-pay | Admitting: Internal Medicine

## 2007-11-28 ENCOUNTER — Ambulatory Visit: Payer: Self-pay | Admitting: Internal Medicine

## 2007-11-28 DIAGNOSIS — R197 Diarrhea, unspecified: Secondary | ICD-10-CM

## 2007-11-28 LAB — CONVERTED CEMR LAB
HIV 1 RNA Quant: 76 copies/mL — ABNORMAL HIGH (ref <50)
HIV-1 RNA Quant, Log: 1.88 — ABNORMAL HIGH (ref <1.70)

## 2007-11-29 LAB — CONVERTED CEMR LAB
AST: 13 units/L (ref 0–37)
Albumin: 4.3 g/dL (ref 3.5–5.2)
Alkaline Phosphatase: 96 units/L (ref 39–117)
Basophils Absolute: 0 10*3/uL (ref 0.0–0.1)
Basophils Relative: 1 % (ref 0–1)
Eosinophils Absolute: 0.1 10*3/uL (ref 0.0–0.7)
MCHC: 30.4 g/dL (ref 30.0–36.0)
MCV: 75.8 fL — ABNORMAL LOW (ref 78.0–100.0)
Neutrophils Relative %: 54 % (ref 43–77)
Platelets: 433 10*3/uL — ABNORMAL HIGH (ref 150–400)
Potassium: 3.8 meq/L (ref 3.5–5.3)
RDW: 14.3 % (ref 11.5–15.5)
Sodium: 139 meq/L (ref 135–145)
Total Protein: 7.2 g/dL (ref 6.0–8.3)
WBC: 5 10*3/uL (ref 4.0–10.5)

## 2007-11-30 ENCOUNTER — Encounter: Payer: Self-pay | Admitting: Internal Medicine

## 2007-11-30 ENCOUNTER — Telehealth: Payer: Self-pay | Admitting: Internal Medicine

## 2007-11-30 DIAGNOSIS — R7989 Other specified abnormal findings of blood chemistry: Secondary | ICD-10-CM | POA: Insufficient documentation

## 2007-12-04 LAB — CONVERTED CEMR LAB: Ferritin: 6 ng/mL — ABNORMAL LOW (ref 10–291)

## 2008-01-02 ENCOUNTER — Ambulatory Visit: Payer: Self-pay | Admitting: Internal Medicine

## 2008-01-02 LAB — CONVERTED CEMR LAB
Basophils Relative: 0.8 % (ref 0.0–3.0)
Ferritin: 12 ng/mL (ref 10.0–291.0)
Hemoglobin: 10.5 g/dL — ABNORMAL LOW (ref 12.0–15.0)
Lymphocytes Relative: 27.9 % (ref 12.0–46.0)
Monocytes Relative: 9.6 % (ref 3.0–12.0)
Neutro Abs: 3.8 10*3/uL (ref 1.4–7.7)
Neutrophils Relative %: 59.7 % (ref 43.0–77.0)
RBC: 4.33 M/uL (ref 3.87–5.11)
RDW: 16 % — ABNORMAL HIGH (ref 11.5–14.6)

## 2008-01-11 ENCOUNTER — Inpatient Hospital Stay (HOSPITAL_COMMUNITY): Admission: EM | Admit: 2008-01-11 | Discharge: 2008-01-15 | Payer: Self-pay | Admitting: Emergency Medicine

## 2008-01-19 ENCOUNTER — Emergency Department (HOSPITAL_COMMUNITY): Admission: EM | Admit: 2008-01-19 | Discharge: 2008-01-19 | Payer: Self-pay | Admitting: Emergency Medicine

## 2008-04-19 ENCOUNTER — Encounter: Payer: Self-pay | Admitting: Internal Medicine

## 2008-04-23 ENCOUNTER — Encounter: Payer: Self-pay | Admitting: Internal Medicine

## 2008-08-23 ENCOUNTER — Telehealth (INDEPENDENT_AMBULATORY_CARE_PROVIDER_SITE_OTHER): Payer: Self-pay | Admitting: *Deleted

## 2008-08-23 ENCOUNTER — Ambulatory Visit: Payer: Self-pay | Admitting: Internal Medicine

## 2008-08-23 LAB — CONVERTED CEMR LAB
Albumin: 4.4 g/dL (ref 3.5–5.2)
BUN: 10 mg/dL (ref 6–23)
Basophils Absolute: 0 10*3/uL (ref 0.0–0.1)
CO2: 22 meq/L (ref 19–32)
Calcium: 9.2 mg/dL (ref 8.4–10.5)
Chloride: 105 meq/L (ref 96–112)
Cholesterol: 191 mg/dL (ref 0–200)
Eosinophils Relative: 3 % (ref 0–5)
Glucose, Bld: 156 mg/dL — ABNORMAL HIGH (ref 70–99)
HCT: 30.7 % — ABNORMAL LOW (ref 36.0–46.0)
HDL: 37 mg/dL — ABNORMAL LOW (ref >39)
HIV-1 RNA Quant, Log: <1.68 (ref <1.68)
Hemoglobin: 9.4 g/dL — ABNORMAL LOW (ref 12.0–15.0)
Lymphocytes Relative: 34 % (ref 12–46)
Monocytes Absolute: 0.3 10*3/uL (ref 0.1–1.0)
Monocytes Relative: 6 % (ref 3–12)
Neutro Abs: 3.3 10*3/uL (ref 1.7–7.7)
Potassium: 4.3 meq/L (ref 3.5–5.3)
RBC: 4.38 M/uL (ref 3.87–5.11)
RDW: 16.3 % — ABNORMAL HIGH (ref 11.5–15.5)
Sodium: 138 meq/L (ref 135–145)
Total Protein: 7.7 g/dL (ref 6.0–8.3)
Triglycerides: 102 mg/dL (ref <150)

## 2008-08-27 ENCOUNTER — Emergency Department (HOSPITAL_COMMUNITY): Admission: EM | Admit: 2008-08-27 | Discharge: 2008-08-27 | Payer: Self-pay | Admitting: Family Medicine

## 2008-09-04 ENCOUNTER — Ambulatory Visit: Payer: Self-pay | Admitting: Internal Medicine

## 2008-09-04 ENCOUNTER — Encounter: Payer: Self-pay | Admitting: Internal Medicine

## 2008-09-04 DIAGNOSIS — N920 Excessive and frequent menstruation with regular cycle: Secondary | ICD-10-CM | POA: Insufficient documentation

## 2008-09-05 LAB — CONVERTED CEMR LAB: Vit D, 25-Hydroxy: 33 ng/mL (ref 30–89)

## 2008-10-25 ENCOUNTER — Emergency Department (HOSPITAL_COMMUNITY): Admission: EM | Admit: 2008-10-25 | Discharge: 2008-10-25 | Payer: Self-pay | Admitting: Emergency Medicine

## 2008-10-27 ENCOUNTER — Emergency Department (HOSPITAL_COMMUNITY): Admission: EM | Admit: 2008-10-27 | Discharge: 2008-10-27 | Payer: Self-pay | Admitting: Emergency Medicine

## 2008-11-27 ENCOUNTER — Encounter: Payer: Self-pay | Admitting: Internal Medicine

## 2008-11-27 ENCOUNTER — Ambulatory Visit: Payer: Self-pay | Admitting: Obstetrics and Gynecology

## 2008-11-27 LAB — CONVERTED CEMR LAB
HCT: 26 % — ABNORMAL LOW (ref 36.0–46.0)
Hemoglobin: 8.1 g/dL — ABNORMAL LOW (ref 12.0–15.0)
RDW: 17.5 % — ABNORMAL HIGH (ref 11.5–15.5)
WBC: 5.6 10*3/uL (ref 4.0–10.5)

## 2008-11-29 ENCOUNTER — Ambulatory Visit (HOSPITAL_COMMUNITY): Admission: RE | Admit: 2008-11-29 | Discharge: 2008-11-29 | Payer: Self-pay | Admitting: Family Medicine

## 2009-04-10 ENCOUNTER — Encounter: Admission: RE | Admit: 2009-04-10 | Discharge: 2009-04-10 | Payer: Self-pay | Admitting: Family Medicine

## 2009-04-20 ENCOUNTER — Emergency Department (HOSPITAL_COMMUNITY): Admission: EM | Admit: 2009-04-20 | Discharge: 2009-04-20 | Payer: Self-pay | Admitting: Emergency Medicine

## 2009-07-04 ENCOUNTER — Ambulatory Visit (HOSPITAL_COMMUNITY): Admission: RE | Admit: 2009-07-04 | Discharge: 2009-07-04 | Payer: Self-pay | Admitting: Obstetrics and Gynecology

## 2009-07-05 ENCOUNTER — Encounter (INDEPENDENT_AMBULATORY_CARE_PROVIDER_SITE_OTHER): Payer: Self-pay | Admitting: *Deleted

## 2009-07-07 ENCOUNTER — Telehealth: Payer: Self-pay | Admitting: Internal Medicine

## 2009-08-20 ENCOUNTER — Ambulatory Visit: Payer: Self-pay | Admitting: Internal Medicine

## 2009-08-20 LAB — CONVERTED CEMR LAB
Albumin: 4.3 g/dL (ref 3.5–5.2)
BUN: 9 mg/dL (ref 6–23)
Basophils Absolute: 0 10*3/uL (ref 0.0–0.1)
Basophils Relative: 0 % (ref 0–1)
CO2: 27 meq/L (ref 19–32)
Cholesterol: 206 mg/dL — ABNORMAL HIGH (ref 0–200)
Eosinophils Relative: 4 % (ref 0–5)
Glucose, Bld: 98 mg/dL (ref 70–99)
HCT: 36.7 % (ref 36.0–46.0)
HDL: 46 mg/dL (ref >39)
HIV-1 RNA Quant, Log: <1.68 (ref <1.68)
Hemoglobin: 11.3 g/dL — ABNORMAL LOW (ref 12.0–15.0)
Lymphocytes Relative: 35 % (ref 12–46)
MCHC: 30.8 g/dL (ref 30.0–36.0)
Monocytes Absolute: 0.5 10*3/uL (ref 0.1–1.0)
Monocytes Relative: 8 % (ref 3–12)
Neutro Abs: 3.2 10*3/uL (ref 1.7–7.7)
Potassium: 4.1 meq/L (ref 3.5–5.3)
RBC: 4.61 M/uL (ref 3.87–5.11)
RDW: 14.8 % (ref 11.5–15.5)
Sodium: 137 meq/L (ref 135–145)
Total Protein: 7.6 g/dL (ref 6.0–8.3)
Triglycerides: 127 mg/dL (ref <150)

## 2009-09-01 ENCOUNTER — Ambulatory Visit: Payer: Self-pay | Admitting: Internal Medicine

## 2009-09-01 LAB — CONVERTED CEMR LAB: Hgb A1c MFr Bld: 7.9 %

## 2010-02-04 ENCOUNTER — Emergency Department (HOSPITAL_COMMUNITY): Admission: EM | Admit: 2010-02-04 | Discharge: 2010-02-04 | Payer: Self-pay | Admitting: Emergency Medicine

## 2010-02-22 ENCOUNTER — Emergency Department (HOSPITAL_COMMUNITY): Admission: EM | Admit: 2010-02-22 | Discharge: 2010-02-22 | Payer: Self-pay | Admitting: Emergency Medicine

## 2010-05-05 ENCOUNTER — Encounter (INDEPENDENT_AMBULATORY_CARE_PROVIDER_SITE_OTHER): Payer: Self-pay | Admitting: *Deleted

## 2010-05-08 ENCOUNTER — Emergency Department (HOSPITAL_COMMUNITY)
Admission: EM | Admit: 2010-05-08 | Discharge: 2010-05-08 | Payer: Self-pay | Source: Home / Self Care | Admitting: Emergency Medicine

## 2010-06-27 ENCOUNTER — Encounter: Payer: Self-pay | Admitting: Family Medicine

## 2010-06-28 ENCOUNTER — Encounter: Payer: Self-pay | Admitting: *Deleted

## 2010-07-01 ENCOUNTER — Encounter (INDEPENDENT_AMBULATORY_CARE_PROVIDER_SITE_OTHER): Payer: Self-pay | Admitting: *Deleted

## 2010-07-03 ENCOUNTER — Other Ambulatory Visit: Payer: Self-pay | Admitting: Family Medicine

## 2010-07-03 DIAGNOSIS — Z1239 Encounter for other screening for malignant neoplasm of breast: Secondary | ICD-10-CM

## 2010-07-03 DIAGNOSIS — Z1231 Encounter for screening mammogram for malignant neoplasm of breast: Secondary | ICD-10-CM

## 2010-07-09 NOTE — Miscellaneous (Signed)
  Clinical Lists Changes  Observations: Added new observation of HOUSEINCOME: 0  (07/01/2010 11:56) Added new observation of YEARLYEXPEN: 0  (07/01/2010 11:56)

## 2010-07-09 NOTE — Miscellaneous (Signed)
  Clinical Lists Changes  Observations: Added new observation of YEARAIDSPOS: 1998  (05/05/2010 11:21)

## 2010-07-09 NOTE — Miscellaneous (Signed)
Summary: Labs  Clinical Lists Changes  Orders: Added new Test order of T-CBC w/Diff [phone]) - Signed Added new Test order of T-CD4SP [facility]) (CD4SP) - Signed Added new Test order of T-Comprehensive Metabolic Panel [phone]) - Signed Added new Test order of T-HIV Viral Load [phone]) - Signed Added new Test order of T-RPR (Syphilis) [phone]) - Signed Added new Test order of T-Lipid Profile [phone]) - Signed     Process Orders Check Orders Results:     [facility]: Check successful Order queued for requisitioning for [facility]: August 20, 2009 2:45 PM  Tests Sent for requisitioning (August 20, 2009 2:45 PM):     08/20/2009: [facility] -- T-CBC w/Diff [[id number]] (signed)     08/20/2009: [facility] -- T-Comprehensive Metabolic Panel [[id number]] (signed)     08/20/2009: [facility] -- T-HIV Viral Load [phone] (signed)     08/20/2009: [facility] -- T-RPR (Syphilis) [phone] (signed)     08/20/2009: [facility] -- T-Lipid Profile [phone] (signed)

## 2010-07-09 NOTE — Progress Notes (Signed)
Summary: drug interaction/TY  Phone Note Call from Patient   Caller: Patient Call For: Brenda Ramming MD Summary of Call: Called patient and she was already aware of the drug interaction with the pain med and the Kaletra, she stopped taking it. Initial call taken by: Starleen Arms CMA,  July 07, 2009 4:50 PM

## 2010-07-09 NOTE — Assessment & Plan Note (Signed)
Summary: est-ck/fu/meds/cfb   Primary Provider:  Alwyn Pea, MD  CC:  follow-up visit.  History of Present Illness: Pt here for f/u.  She c/o HAs.  Her PCP gives her percocet for the headaches.  No missed doses of he HIV meds.  Preventive Screening-Counseling & Management  Alcohol-Tobacco     Alcohol drinks/day: 0     Smoking Status: never     Passive Smoke Exposure: no  Caffeine-Diet-Exercise     Caffeine use/day: soda,coffe     Does Patient Exercise: yes  Hep-HIV-STD-Contraception     HIV Risk: no  Safety-Violence-Falls     Seat Belt Use: 25     Violence in the Home: no risk noted     Sexual Abuse: no      Sexual History:  n/a.     Updated Prior Medication List: TRUVADA 200-300 MG TABS (EMTRICITABINE-TENOFOVIR) One pill a day KALETRA 200-50 MG TABS (LOPINAVIR-RITONAVIR) Take 2 tablets by mouth twice daily LEVEMIR 100 UNIT/ML  SOLN (INSULIN DETEMIR) 60 units a day PERCOCET 5-325 MG  TABS (OXYCODONE-ACETAMINOPHEN) Take 1 tablet by mouth every 6 hours prn AMBIEN 10 MG  TABS (ZOLPIDEM TARTRATE) 1 at bedtime prn FERROUS SULFATE 325 (65 FE) MG  TABS (FERROUS SULFATE) Take 1 tablet by mouth three times a day  Current Allergies (reviewed today): ! MORPHINE ! VICODIN ! CORTISONE Past History:  Past Medical History: Last updated: 01/02/2008 HIV disease Diabetes  Social History: Sexual History:  n/a  Review of Systems  The patient denies anorexia, fever, and weight loss.    Vital Signs:  Patient profile:   40 year old female Menstrual status:  regular Height:      67 inches (170.18 cm) Weight:      239.6 pounds (108.91 kg) BMI:     37.66 Temp:     97.1 degrees F (36.17 degrees C) oral Pulse rate:   103 / minute BP sitting:   138 / 89  (left arm)  Vitals Entered By: Wendall Mola CMA Duncan Dull) (September 01, 2009 3:52 PM) CC: follow-up visit Is Patient Diabetic? Yes Did you bring your meter with you today? No Pain Assessment Patient in pain? yes      Location: head Intensity: 8 Type: aching Onset of pain  Constant Nutritional Status BMI of > 30 = obese  Does patient need assistance? Functional Status Self care Ambulation Normal Comments no missed doses of meds per patient   Physical Exam  General:  alert, well-developed, well-nourished, and well-hydrated.   Head:  normocephalic and atraumatic.   Lungs:  normal breath sounds.      Impression & Recommendations:  Problem # 1:  HIV DISEASE (ICD-042) Pt.s most recent CD4ct was 620 and VL <48 .  Pt instructed to continue the current antiretroviral regimen.  Pt encouraged to take medication regularly and not miss doses.  Pt will f/u in 3 months for repeat blood work and will see me 2 weeks later.  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  Lab reuslts given to patient to take to PCP. Cholesterol elevated - will let PCP address this. Her updated medication list for this problem includes:    Levemir 100 Unit/ml Soln (Insulin detemir) .Marland KitchenMarland KitchenMarland KitchenMarland Kitchen 60 units a day  Orders: T-Hgb A1C (in-house) (16109UE)  Other Orders: Est. Patient Level III (45409) Future Orders: T-CD4SP ([facility]) (CD4SP) ... 11/30/2009 T-HIV Viral Load [phone]) ... 11/30/2009 T-Comprehensive Metabolic Panel [phone]) ... 11/30/2009 T-CBC w/Diff ([id number]) ... 11/30/2009  Patient Instructions: 1)  Please schedule a follow-up appointment in 3 months, 2 weeks after labs.  Process Orders Check Orders Results:     [facility]: Check successful Tests Sent for requisitioning (September 04, 2009 11:03 AM):     11/30/2009: [facility] -- T-HIV Viral Load [phone] (signed)     11/30/2009: [facility] -- T-Comprehensive Metabolic Panel [[id number]] (signed)     11/30/2009: [facility] -- [facility] w/Diff [[id number]] (signed)   Laboratory Results   Blood Tests   Date/Time Received: September 01, 2009 4:25 PM Date/Time Reported: Alric Quan  September 01, 2009 4:25 PM   HGBA1C: 7.9%   (Normal Range: Non-Diabetic - 3-6%   Control Diabetic - 6-8%)

## 2010-07-13 ENCOUNTER — Emergency Department (HOSPITAL_COMMUNITY): Payer: Medicare Other

## 2010-07-13 ENCOUNTER — Ambulatory Visit
Admission: RE | Admit: 2010-07-13 | Discharge: 2010-07-13 | Disposition: A | Discharge status: Home / Self Care | Payer: Self-pay | Source: Ambulatory Visit | Attending: Family Medicine | Admitting: Family Medicine

## 2010-07-13 ENCOUNTER — Encounter (INDEPENDENT_AMBULATORY_CARE_PROVIDER_SITE_OTHER): Payer: Self-pay | Admitting: *Deleted

## 2010-07-13 ENCOUNTER — Emergency Department (HOSPITAL_COMMUNITY)
Admission: EM | Admit: 2010-07-13 | Discharge: 2010-07-14 | Disposition: A | Discharge status: Home / Self Care | Payer: Medicare Other | Attending: Emergency Medicine | Admitting: Emergency Medicine

## 2010-07-13 DIAGNOSIS — Z1231 Encounter for screening mammogram for malignant neoplasm of breast: Secondary | ICD-10-CM

## 2010-07-13 DIAGNOSIS — Z21 Asymptomatic human immunodeficiency virus [HIV] infection status: Secondary | ICD-10-CM | POA: Insufficient documentation

## 2010-07-13 DIAGNOSIS — R109 Unspecified abdominal pain: Secondary | ICD-10-CM | POA: Insufficient documentation

## 2010-07-13 DIAGNOSIS — R110 Nausea: Secondary | ICD-10-CM | POA: Insufficient documentation

## 2010-07-13 DIAGNOSIS — E119 Type 2 diabetes mellitus without complications: Secondary | ICD-10-CM | POA: Insufficient documentation

## 2010-07-13 LAB — CBC
HCT: 35.9 % — ABNORMAL LOW (ref 36.0–46.0)
Hemoglobin: 11.7 g/dL — ABNORMAL LOW (ref 12.0–15.0)
MCH: 25.1 pg — ABNORMAL LOW (ref 26.0–34.0)
MCHC: 32.6 g/dL (ref 30.0–36.0)
MCV: 76.9 fL — ABNORMAL LOW (ref 78.0–100.0)
Platelets: 396 K/uL (ref 150–400)
RBC: 4.67 MIL/uL (ref 3.87–5.11)
RDW: 13.2 % (ref 11.5–15.5)
WBC: 7.6 K/uL (ref 4.0–10.5)

## 2010-07-13 LAB — POCT PREGNANCY, URINE: Preg Test, Ur: NEGATIVE

## 2010-07-13 LAB — DIFFERENTIAL
Basophils Relative: 0 % (ref 0–1)
Eosinophils Absolute: 0.3 10*3/uL (ref 0.0–0.7)
Neutrophils Relative %: 54 % (ref 43–77)

## 2010-07-13 LAB — BASIC METABOLIC PANEL
Chloride: 101 mEq/L (ref 96–112)
GFR calc Af Amer: >60 mL/min (ref >60)
Potassium: 3.9 mEq/L (ref 3.5–5.1)
Sodium: 138 mEq/L (ref 135–145)

## 2010-07-14 LAB — URINALYSIS, ROUTINE W REFLEX MICROSCOPIC
Ketones, ur: NEGATIVE mg/dL
Nitrite: NEGATIVE
Specific Gravity, Urine: 1.026 (ref 1.005–1.030)
pH: 5.5 (ref 5.0–8.0)

## 2010-07-14 LAB — URINE MICROSCOPIC-ADD ON

## 2010-07-23 NOTE — Op Note (Signed)
Summary: Operative Report-Hysteroscopy/D&C       NAME:  Brenda [name], Brenda [name]                 ACCOUNT NO.:  [id number]      MEDICAL RECORD NO.:  [id number]          PATIENT TYPE:  AMB      LOCATION:  SDC                           FACILITY:  [facility]      PHYSICIAN:  Janine Limbo, M.D.DATE OF BIRTH:  August 04, 1970      DATE OF PROCEDURE:  07/04/2009   DATE OF DISCHARGE:  07/04/2009                                  OPERATIVE REPORT      PREOPERATIVE DIAGNOSES:   1. Menorrhagia   2. Dysmenorrhea.   3. Endometrial polyps.   4. Anemia.   5. Body mass index equals 35.      POSTOPERATIVE DIAGNOSES:   1. Menorrhagia   2. Dysmenorrhea.   3. Endometrial polyps.   4. Anemia.   5. Body mass index equals 35.      PROCEDURES:   1. Hysteroscopy with resection of endometrial polyps.   2. Dilatation and curettage.      SURGEON:  Janine Limbo, MD      FIRST ASSISTANT:  None.      ANESTHETIC:  General.      DISPOSITION:  Brenda [name] is a 40 year old female, para 4-0-4-4, who   presents with the above-mentioned diagnoses.  She understands the   indications for her surgical procedure and she accepts the risks of, but   not limited to, anesthetic complications, bleeding, infections, and   possible damage to the surrounding organs.      FINDINGS:  The uterus was upper limits, normal size.  No adnexal masses   were appreciated.  The uterus sounded to 9 cm.  Endometrial polyps were   noted.  The patient's hemoglobin preoperatively was 11.7.      PROCEDURE IN DETAILS:  The patient was taken to the operating room where   general anesthetic was given.  The patient's lower abdomen, perineum,   and vagina were prepped with multiple layers of Betadine.  The bladder   was drained of urine.  Examination under anesthesia was performed.  The   patient was sterilely draped.  A paracervical block was placed using 10   mL of 0.5% Marcaine with epinephrine.  An endocervical curettage was   performed.  The  uterus sounded to 9 cm.  The cervix was gently dilated.   The operative hysteroscope was inserted and the cavity was carefully   inspected.  Pictures were taken.  The endometrial polyps were removed   using a single loop.  The operative hysteroscope was then removed and   the cavity was curetted until it was felt to be clean.  Hemostasis was   confirmed.  The hysteroscope was reinserted and the cavity was   inspected.  No lesions were appreciated.  All instruments were removed.   Examination under anesthesia was repeated and the uterus was noted to be   firm.  Sponge, needle, and instrument counts were correct.  The   estimated blood loss was less than 10 mL.  The estimated fluid deficit  was 45 mL.  The patient tolerated her procedure well.  Her anesthetic   was reversed without difficulty and the patient was transported to the   recovery room in stable condition.  Sponge, needle, and instrument   counts were correct on 2 occasions.  The patient received Toradol 30 mg   intramuscularly and 30 mg intravenously during the procedure.  The   endocervical curettings, endometrial resections, and endometrial   curettings were sent to Pathology for evaluation.      FOLLOWUP INSTRUCTIONS:  The patient will return to see Dr. Stefano Gaul in 2   weeks for followup examination.  She was given a copy of the   postoperative instruction sheet as prepared by the [facility]   [facility] for patients who have undergone a dilatation and curettage.   She was given a prescription for:   1. Zofran 8 mg every 8 hours as needed for nausea.   2. Motrin 800 mg every 8 hours as needed for mild-to-moderate pain.   3. Percocet 5/325 one or two tablets every 4 hours as needed for       severe pain.      The patient will call for questions or concerns.                  Janine Limbo, M.D.            AVS/MEDQ  D:  07/04/2009  T:  07/05/2009  Job:  [id number]      cc:   Tanya D. Daphine Deutscher, M.D.   Fax:  [phone]         Electronically Signed by Kirkland Hun M.D. on 07/06/2009 09:05:35 PM

## 2010-07-24 ENCOUNTER — Ambulatory Visit: Payer: Self-pay | Admitting: Internal Medicine

## 2010-07-27 ENCOUNTER — Telehealth: Payer: Self-pay | Admitting: Internal Medicine

## 2010-08-04 NOTE — Progress Notes (Signed)
----   Converted from flag ---- ---- 07/25/2010 8:52 AM, Iva Boop MD, [facility] wrote: no charge this time ---- 07/24/2010 8:46 AM, Milford Cage NCMA wrote:   ---- 07/24/2010 8:44 AM, Karna Christmas wrote: pt. canceled and r/s until 08-20-10 b/c she needs a Tues. or Thurs. appt ------------------------------

## 2010-08-18 ENCOUNTER — Encounter: Payer: Self-pay | Admitting: Infectious Diseases

## 2010-08-18 ENCOUNTER — Other Ambulatory Visit (INDEPENDENT_AMBULATORY_CARE_PROVIDER_SITE_OTHER): Payer: Medicare Other

## 2010-08-18 ENCOUNTER — Other Ambulatory Visit: Payer: Self-pay | Admitting: Infectious Diseases

## 2010-08-18 DIAGNOSIS — B20 Human immunodeficiency virus [HIV] disease: Secondary | ICD-10-CM

## 2010-08-18 LAB — URINALYSIS, ROUTINE W REFLEX MICROSCOPIC
Bilirubin Urine: NEGATIVE
Glucose, UA: NEGATIVE mg/dL
Hgb urine dipstick: NEGATIVE
Specific Gravity, Urine: 1.023 (ref 1.005–1.030)
Urobilinogen, UA: 1 mg/dL (ref 0.0–1.0)
pH: 6 (ref 5.0–8.0)

## 2010-08-18 LAB — CONVERTED CEMR LAB
BUN: 11 mg/dL (ref 6–23)
Basophils Absolute: 0 10*3/uL (ref 0.0–0.1)
Basophils Relative: 0 % (ref 0–1)
CO2: 26 meq/L (ref 19–32)
Creatinine, Ser: 0.84 mg/dL (ref 0.40–1.20)
Eosinophils Relative: 5 % (ref 0–5)
Glucose, Bld: 164 mg/dL — ABNORMAL HIGH (ref 70–99)
HCT: 34.4 % — ABNORMAL LOW (ref 36.0–46.0)
HIV 1 RNA Quant: <20 copies/mL (ref <20)
Lymphocytes Relative: 36 % (ref 12–46)
MCHC: 31.4 g/dL (ref 30.0–36.0)
Monocytes Absolute: 0.3 10*3/uL (ref 0.1–1.0)
Neutro Abs: 2.7 10*3/uL (ref 1.7–7.7)
Platelets: 368 10*3/uL (ref 150–400)
RBC: 4.37 M/uL (ref 3.87–5.11)
RDW: 14 % (ref 11.5–15.5)
Sodium: 140 meq/L (ref 135–145)
Total Bilirubin: 0.3 mg/dL (ref 0.3–1.2)
Total Protein: 7.3 g/dL (ref 6.0–8.3)
WBC: 5.2 10*3/uL (ref 4.0–10.5)

## 2010-08-18 LAB — DIFFERENTIAL
Basophils Relative: 0 % (ref 0–1)
Lymphs Abs: 2.4 10*3/uL (ref 0.7–4.0)
Monocytes Absolute: 0.5 10*3/uL (ref 0.1–1.0)
Monocytes Relative: 8 % (ref 3–12)
Neutro Abs: 3 10*3/uL (ref 1.7–7.7)

## 2010-08-18 LAB — URINE MICROSCOPIC-ADD ON

## 2010-08-18 LAB — POCT PREGNANCY, URINE: Preg Test, Ur: NEGATIVE

## 2010-08-18 LAB — CBC
HCT: 35.1 % — ABNORMAL LOW (ref 36.0–46.0)
Hemoglobin: 11.1 g/dL — ABNORMAL LOW (ref 12.0–15.0)
MCHC: 31.6 g/dL (ref 30.0–36.0)

## 2010-08-19 LAB — T-HELPER CELL (CD4) - (RCID CLINIC ONLY)
CD4 % Helper T Cell: 30 % — ABNORMAL LOW (ref 33–55)
CD4 T Cell Abs: 530 uL (ref 400–2700)

## 2010-08-20 ENCOUNTER — Ambulatory Visit: Payer: Self-pay | Admitting: Internal Medicine

## 2010-08-20 LAB — CBC
HCT: 33.5 % — ABNORMAL LOW (ref 36.0–46.0)
Hemoglobin: 10.8 g/dL — ABNORMAL LOW (ref 12.0–15.0)
MCH: 25.3 pg — ABNORMAL LOW (ref 26.0–34.0)
MCHC: 32.2 g/dL (ref 30.0–36.0)
MCV: 78.5 fL (ref 78.0–100.0)
Platelets: 343 10*3/uL (ref 150–400)
RBC: 4.27 MIL/uL (ref 3.87–5.11)
RDW: 14.5 % (ref 11.5–15.5)
WBC: 6.5 10*3/uL (ref 4.0–10.5)

## 2010-08-20 LAB — URINALYSIS, ROUTINE W REFLEX MICROSCOPIC
Bilirubin Urine: NEGATIVE
Glucose, UA: NEGATIVE mg/dL
Hgb urine dipstick: NEGATIVE
Ketones, ur: NEGATIVE mg/dL
Nitrite: NEGATIVE
Protein, ur: NEGATIVE mg/dL
Specific Gravity, Urine: 1.021 (ref 1.005–1.030)
Urobilinogen, UA: 0.2 mg/dL (ref 0.0–1.0)
pH: 6.5 (ref 5.0–8.0)

## 2010-08-20 LAB — BASIC METABOLIC PANEL
BUN: 8 mg/dL (ref 6–23)
CO2: 27 mEq/L (ref 19–32)
Calcium: 9 mg/dL (ref 8.4–10.5)
Chloride: 107 mEq/L (ref 96–112)
Creatinine, Ser: 0.7 mg/dL (ref 0.4–1.2)
GFR calc Af Amer: >60 mL/min (ref >60)
GFR calc non Af Amer: >60 mL/min (ref >60)
Glucose, Bld: 149 mg/dL — ABNORMAL HIGH (ref 70–99)
Potassium: 3.7 mEq/L (ref 3.5–5.1)
Sodium: 139 mEq/L (ref 135–145)

## 2010-08-20 LAB — URINE MICROSCOPIC-ADD ON

## 2010-08-24 LAB — URINALYSIS, ROUTINE W REFLEX MICROSCOPIC
Bilirubin Urine: NEGATIVE
Hgb urine dipstick: NEGATIVE
Ketones, ur: NEGATIVE mg/dL
Protein, ur: NEGATIVE mg/dL
Urobilinogen, UA: 0.2 mg/dL (ref 0.0–1.0)

## 2010-08-24 LAB — BASIC METABOLIC PANEL
BUN: 10 mg/dL (ref 6–23)
CO2: 24 mEq/L (ref 19–32)
GFR calc non Af Amer: >60 mL/min (ref >60)
Glucose, Bld: 189 mg/dL — ABNORMAL HIGH (ref 70–99)
Potassium: 3.6 mEq/L (ref 3.5–5.1)

## 2010-08-24 LAB — CBC
HCT: 36.1 % (ref 36.0–46.0)
Hemoglobin: 11.7 g/dL — ABNORMAL LOW (ref 12.0–15.0)
MCHC: 32.4 g/dL (ref 30.0–36.0)
Platelets: 362 10*3/uL (ref 150–400)
RDW: 14.6 % (ref 11.5–15.5)

## 2010-08-25 ENCOUNTER — Ambulatory Visit (INDEPENDENT_AMBULATORY_CARE_PROVIDER_SITE_OTHER): Payer: Medicare Other | Admitting: Internal Medicine

## 2010-08-25 ENCOUNTER — Encounter: Payer: Self-pay | Admitting: Internal Medicine

## 2010-08-25 VITALS — BP 112/70 | HR 84 | Ht 68.0 in | Wt 245.0 lb

## 2010-08-25 DIAGNOSIS — K921 Melena: Secondary | ICD-10-CM

## 2010-08-25 DIAGNOSIS — E669 Obesity, unspecified: Secondary | ICD-10-CM

## 2010-08-25 MED ORDER — PEG-KCL-NACL-NASULF-NA ASC-C 100 G PO SOLR: 1.0000 | ORAL | Status: DC

## 2010-08-25 NOTE — Patient Instructions (Signed)
Colonoscopy brochure given Your prescription has been sent to your pharmacy. You have been given an appointment for your colon on 10/06/10 at 8:30 am.
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
## 2010-08-31 LAB — T-HELPER CELL (CD4) - (RCID CLINIC ONLY): CD4 % Helper T Cell: 29 % — ABNORMAL LOW (ref 33–55)

## 2010-09-01 ENCOUNTER — Encounter: Payer: Self-pay | Admitting: Adult Health

## 2010-09-01 ENCOUNTER — Ambulatory Visit (INDEPENDENT_AMBULATORY_CARE_PROVIDER_SITE_OTHER): Payer: Medicare Other | Admitting: Adult Health

## 2010-09-01 VITALS — BP 124/84 | HR 86 | Temp 97.6°F | Ht 69.0 in | Wt 242.0 lb

## 2010-09-01 DIAGNOSIS — B20 Human immunodeficiency virus [HIV] disease: Secondary | ICD-10-CM

## 2010-09-09 LAB — POCT I-STAT, CHEM 8
BUN: 9 mg/dL (ref 6–23)
Creatinine, Ser: 0.9 mg/dL (ref 0.4–1.2)
Glucose, Bld: 96 mg/dL (ref 70–99)
Hemoglobin: 12.6 g/dL (ref 12.0–15.0)
Potassium: 3.8 mEq/L (ref 3.5–5.1)
Sodium: 140 mEq/L (ref 135–145)

## 2010-09-15 ENCOUNTER — Other Ambulatory Visit (INDEPENDENT_AMBULATORY_CARE_PROVIDER_SITE_OTHER): Payer: Medicare Other | Admitting: Licensed Clinical Social Worker

## 2010-09-15 DIAGNOSIS — B20 Human immunodeficiency virus [HIV] disease: Secondary | ICD-10-CM

## 2010-09-15 LAB — GLUCOSE, CAPILLARY: Glucose-Capillary: 142 mg/dL — ABNORMAL HIGH (ref 70–99)

## 2010-09-15 LAB — URINE CULTURE

## 2010-09-15 LAB — POCT I-STAT, CHEM 8
BUN: 9 mg/dL (ref 6–23)
Calcium, Ion: 1.15 mmol/L (ref 1.12–1.32)
Chloride: 104 mEq/L (ref 96–112)
Creatinine, Ser: 1.1 mg/dL (ref 0.4–1.2)
Glucose, Bld: 85 mg/dL (ref 70–99)
TCO2: 26 mmol/L (ref 0–100)

## 2010-09-15 LAB — URINALYSIS, ROUTINE W REFLEX MICROSCOPIC
Bilirubin Urine: NEGATIVE
Ketones, ur: NEGATIVE mg/dL
Nitrite: NEGATIVE
Specific Gravity, Urine: 1.024 (ref 1.005–1.030)
Urobilinogen, UA: 0.2 mg/dL (ref 0.0–1.0)
pH: 5.5 (ref 5.0–8.0)

## 2010-09-15 LAB — DIFFERENTIAL
Basophils Absolute: 0 10*3/uL (ref 0.0–0.1)
Eosinophils Relative: 2 % (ref 0–5)
Lymphocytes Relative: 11 % — ABNORMAL LOW (ref 12–46)
Lymphs Abs: 1 10*3/uL (ref 0.7–4.0)
Monocytes Absolute: 0.5 10*3/uL (ref 0.1–1.0)
Monocytes Relative: 6 % (ref 3–12)
Neutro Abs: 7.1 10*3/uL (ref 1.7–7.7)

## 2010-09-15 LAB — CBC
HCT: 28.8 % — ABNORMAL LOW (ref 36.0–46.0)
Hemoglobin: 8.9 g/dL — ABNORMAL LOW (ref 12.0–15.0)
RBC: 4.19 MIL/uL (ref 3.87–5.11)
WBC: 8.7 10*3/uL (ref 4.0–10.5)

## 2010-09-15 LAB — URINE MICROSCOPIC-ADD ON

## 2010-09-15 LAB — POCT PREGNANCY, URINE: Preg Test, Ur: NEGATIVE

## 2010-09-15 MED ORDER — LOPINAVIR-RITONAVIR 200-50 MG PO TABS: 2.0000 | ORAL_TABLET | Freq: Every day | ORAL | Status: DC

## 2010-09-15 MED ORDER — EMTRICITABINE-TENOFOVIR DF 200-300 MG PO TABS: 1.0000 | ORAL_TABLET | Freq: Every day | ORAL | Status: DC

## 2010-09-16 ENCOUNTER — Other Ambulatory Visit: Payer: Self-pay | Admitting: Infectious Diseases

## 2010-09-16 DIAGNOSIS — B20 Human immunodeficiency virus [HIV] disease: Secondary | ICD-10-CM

## 2010-09-17 LAB — T-HELPER CELL (CD4) - (RCID CLINIC ONLY): CD4 T Cell Abs: 480 uL (ref 400–2700)

## 2010-09-29 ENCOUNTER — Other Ambulatory Visit: Payer: Medicare Other

## 2010-10-05 ENCOUNTER — Encounter: Payer: Self-pay | Admitting: Internal Medicine

## 2010-10-06 ENCOUNTER — Encounter: Payer: Self-pay | Admitting: Internal Medicine

## 2010-10-06 ENCOUNTER — Ambulatory Visit (AMBULATORY_SURGERY_CENTER): Payer: Medicare Other | Admitting: Internal Medicine

## 2010-10-06 VITALS — BP 144/96 | HR 85 | Temp 96.8°F | Resp 11 | Ht 68.0 in | Wt 235.0 lb

## 2010-10-06 DIAGNOSIS — K921 Melena: Secondary | ICD-10-CM

## 2010-10-06 DIAGNOSIS — K648 Other hemorrhoids: Secondary | ICD-10-CM

## 2010-10-06 DIAGNOSIS — K649 Unspecified hemorrhoids: Secondary | ICD-10-CM

## 2010-10-06 LAB — GLUCOSE, CAPILLARY

## 2010-10-06 MED ORDER — SODIUM CHLORIDE 0.9 % IV SOLN: 500.0000 mL | INTRAVENOUS | Status: DC

## 2010-10-06 NOTE — Patient Instructions (Addendum)
Your bleeding is coming from hemorrhoids. It is important to avoid straining to stool and to have regular bowel habits without very hard stools or diarrhea to avoid bleeding from these. It is not a dangerous problem. Your anemia is mostly if not all due to menstrual blood loss.Keep follow-up with gynecologist.   Hemorrhoids Hemorrhoids are dilated (enlarged) veins around the rectum. Sometimes clots will form in the veins. This makes them swollen and painful. These are called thrombosed hemorrhoids. Causes of hemorrhoids include:  Pregnancy: this increases the pressure in the hemorrhoidal veins.   Constipation.   Straining to have a bowel movement.  HOME CARE INSTRUCTIONS  Eat a well balanced diet and drink 6 to 8 glasses of water every day to avoid constipation. You may also use a bulk laxative.   Avoid straining to have bowel movements.   Keep anal area dry and clean.   Only take over-the-counter or prescription medicines for pain, discomfort, or fever as directed by your caregiver.  If thrombosed:  Take hot sitz baths for 20 to 30 minutes, 3 to 4 times per day.   If the hemorrhoids are very tender and swollen, place ice packs on area as tolerated. Using ice packs between sitz baths may be helpful. Fill a plastic bag with ice and use a towel between the bag of ice and your skin.   Special creams and suppositories (Anusol, Nupercainal, Wyanoids) may be used or applied as directed.   Do not use a donut shaped pillow or sit on the toilet for long periods. This increases blood pooling and pain.   Move your bowels when your body has the urge; this will require less straining and will decrease pain and pressure.   Only take over-the-counter or prescription medicines for pain, discomfort, or fever as directed by your caregiver.  SEEK MEDICAL CARE IF:  You have increasing pain and swelling that is not controlled with your prescription.   You have uncontrolled bleeding.   You have an  inability or difficulty having a bowel movement.   You have pain or inflammation outside the area of the hemorrhoids.   You have chills and/or an oral temperature above 100.5 that lasts for 2 days or longer, or as your caregiver suggests.  MAKE SURE YOU:   Understand these instructions.   Will watch your condition.   Will get help right away if you are not doing well or get worse.  Document Released: 05/21/2000 Document Re-Released: 05/06/2008 [facility] Patient Information 2011 [place], [place]. Discharged instructions given to care partner. No information given per patient request. Information given in sealed envelope.

## 2010-10-07 ENCOUNTER — Telehealth: Payer: Self-pay | Admitting: *Deleted

## 2010-10-07 NOTE — Telephone Encounter (Signed)
No answer

## 2010-10-15 ENCOUNTER — Inpatient Hospital Stay (HOSPITAL_COMMUNITY): Admit: 2010-10-15 | Discharge: 2010-10-15 | Disposition: A | Discharge status: Home / Self Care | Payer: Medicare Other

## 2010-10-15 ENCOUNTER — Emergency Department (HOSPITAL_COMMUNITY)
Admission: EM | Admit: 2010-10-15 | Discharge: 2010-10-15 | Discharge status: Against Medical Advice | Payer: No Typology Code available for payment source | Attending: Emergency Medicine | Admitting: Emergency Medicine

## 2010-10-19 ENCOUNTER — Ambulatory Visit: Payer: Medicare Other | Admitting: Adult Health
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
## 2010-10-20 NOTE — Group Therapy Note (Signed)
NAME:  Brenda [name], Brenda [name] NO.:  [id number]   MEDICAL RECORD NO.:  [id number]          PATIENT TYPE:  WOC   LOCATION:  [facility]                   FACILITY:  [facility]   PHYSICIAN:  Argentina Donovan, MD        DATE OF BIRTH:  02/27/1971   DATE OF SERVICE:  11/27/2008                                  CLINIC NOTE   The patient is a 40 year old African American female with HIV who comes  in because of severe menorrhagia with pelvic pain, mainly dysmenorrhea,  since her tubal ligation after the birth of her last baby 4 years ago.  She is a gravida 4, para 4-0-0-4.  She is on antiviral medication, also  on insulin as she is a type 1 diabetic.   She weighs 225 pounds and she is 5 feet 7 inches tall.  Blood pressure  119/84, pulse 90.   She states that the pain is mainly in the left side and radiates into  her left hip down into her left leg and the bleeding is almost like a  faucet.  It has gotten worse over the last several years.  Said she is  always anemic, although she is not taking any iron.  I am placing her on  iron.  We are getting an ultrasound and a CBC.  Given her some Motrin  for pain.  I am going to have her come back after the ultrasound so we  can see whether she is going to need major surgery or an endometrial  ablation might be adequate and evaluate what may be causing the pain in  the left lower quadrant.   EXAMINATION:  ABDOMEN:  Soft, flat, nontender.  No masses or  organomegaly.  External genitalia is normal.  BUS within normal limits.  Vagina has moderate amount of blood in the vault, well rugated.  Cervix  clean, parous.  Uterus and adnexa could not be palpated because of the  habitus of the patient.  Pap smear was taken 3 months ago in a private  doctor's office.           ______________________________  Argentina Donovan, MD     PR/MEDQ  D:  11/27/2008  T:  11/27/2008  Job:  [id number]
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
## 2010-10-23 NOTE — Discharge Summary (Signed)
Brenda [name], MESSLER                 ACCOUNT NO.:  [id number]   MEDICAL RECORD NO.:  [id number]          PATIENT TYPE:  INP   LOCATION:  9170                          [facility]   PHYSICIAN:  Conni Elliot, M.D.DATE OF BIRTH:  [birth year]   DATE OF ADMISSION:  09/01/2004  DATE OF DISCHARGE:                                 DISCHARGE SUMMARY   ADDENDUM:  The discharge was held on the patient due to the fact that the  morning of her discharge the fetus became nonreactive. A BPP was done which  was 6/8, two points taken off for no breathing. She is being transferred to  mother-baby for intermittent monitoring.      LC/MEDQ  D:  09/03/2004  T:  09/03/2004  Job:  [id number]

## 2010-10-23 NOTE — Discharge Summary (Signed)
Brenda [name], Brenda [name]                 ACCOUNT NO.:  [id number]   MEDICAL RECORD NO.:  [id number]          PATIENT TYPE:  INP   LOCATION:  9154                          [facility]   PHYSICIAN:  Tanya S. Shawnie Pons, M.D.   DATE OF BIRTH:  [birth year]   DATE OF ADMISSION:  08/02/2004  DATE OF DISCHARGE:                                 DISCHARGE SUMMARY   ADMISSION DIAGNOSES:  21.  A 40 year old gravida 6 para 3-0-2-3 at 4 and 6 with vomiting and      diarrhea.  2.  Hyperglycemia in the setting of diabetes.  3.  Human immunodeficiency virus.   DISCHARGE DIAGNOSES:  22.  A 40 year old gravida 6 para 3-0-2-3 at 40 and 1 weeks with resolved      viral gastroenteritis.  2.  Diabetes mellitus with improved glycemic control.  3.  Human immunodeficiency virus.  4.  Reassuring fetal heart tracing.   DISCHARGE MEDICATIONS:  1.  Lantus 52 units subcu q.h.s.  2.  Humalog 15 units subcu q.a.c.  3.  Kaletra two tablets twice a day.  4.  Truvada one p.o. daily.  5.  AZT one p.o. b.i.d.  6.  Prenatal vitamin one p.o. daily.   ADMISSION HISTORY:  [name] was admitted to the hospital at 28 and 6 with  suspected viral gastroenteritis. She was placed on IV fluids and given  Phenergan.   HOSPITAL COURSE:  The patient's nausea, vomiting, and diarrhea resolved by  hospital day #1.5. She was taking a regular diet by discharge.   Diabetes mellitus:  Her sugars became more controlled in the hospital  setting on her home regimen. However, they were not ideal with fastings in  the upper 90s and some postprandials greater than 140, and her Lantus was  increased to 52 units from 50.   Human immunodeficiency virus:  She was continued on her home regimen. She  has a follow-up with Dr. Roxan Hockey in March.   The patient did have headache on the day prior to discharge as well as the  day of discharge. She does have a history of migraine and stated on day of  discharge that it did feel like her migraine.  However, the day prior to  discharge the headache she thought was different. Her blood pressures were  controlled and a PIH panel was within normal limits, and urinalysis showed  no protein. The only mildly abnormal lab was a uric acid of 5.6 which is  borderline high.   CONDITION ON DISCHARGE:  The patient was discharged to home in stable  condition. She has a follow-up appointment at high risk clinic in 1 week and  was told to increase her Lantus to 52 units, otherwise to stay on her normal  medications. She was given preterm labor precautions and was told to  increase her fluid intake.      LC/MEDQ  D:  08/04/2004  T:  08/04/2004  Job:  [id number]

## 2010-10-23 NOTE — Discharge Summary (Signed)
NAMEAUBRIANA, Brenda [name] NO.:  [id number]   MEDICAL RECORD NO.:  [id number]          PATIENT TYPE:  INP   LOCATION:  9161                          [facility]  [facility]   PHYSICIAN:  Lesly Dukes, M.D. DATE OF BIRTH:  02-24-1971   DATE OF ADMISSION:  08/12/2004  DATE OF DISCHARGE:  08/14/2004                                 DISCHARGE SUMMARY   ADDENDUM TO PREVIOUS DICTATION #[id number]   ADDENDUM:  The patient began complaining of decreased fetal movement about  midday on August 13, 2004 at the time of her pending discharge. The patient  was placed back on continuous fetal monitoring. The baby was noted to have  heart rate in the 140s with occasional accelerations 10 x 10. The patient  was noted to have one deceleration. Occasional contractions were noted on  toco. The patient then began complaining of lower abdominal pain. A repeat  BPP was obtained with results of 6/8 and an AFI of 11.4. The patient was  maintained on continuous fetal monitoring with accelerations and variability  noted throughout the night. The patient also underwent a standard vaginal  examination. Cervix was fingertip, thick, and -2 station. The morning of  August 14, 2004 the patient's fasting blood glucose was 130. The patient was  without complaints of any pain, headache, and was stating that she now was  feeling the baby move. The patient will be discharged to home August 14, 2004  with the same directions as were noted August 13, 2004. The patient is to  continue her current home medications. We will increase her Lantus from 52  units to 56 units subcu at bedtime. The patient is to follow up in the MAU  for a nonstress test on August 15, 2004. The patient is also to follow up in  high risk clinic on Monday, August 17, 2004. The patient is discharged to  home in stable condition. The patient will be given instructions on preterm  labor precautions.      JT/MEDQ  D:  08/14/2004  T:  08/14/2004  Job:   [id number]

## 2010-10-23 NOTE — Discharge Summary (Signed)
Brenda [name], [name] NO.:  [id number]   MEDICAL RECORD NO.:  [id number]          PATIENT TYPE:  WOC   LOCATION:  WOC                          FACILITY:  [facility]   PHYSICIAN:  Phil D. Okey Dupre, M.D.     DATE OF BIRTH:  12-Apr-1971   DATE OF ADMISSION:  08/12/2004  DATE OF DISCHARGE:  08/13/2004                                 DISCHARGE SUMMARY   ADMISSION DIAGNOSES:  56.  A 40 year old black female, gravida 6, para 3-0-2-3, at 33-2/[redacted] weeks      gestation with complaints of decreased fetal movement.  2.  Diabetes.  3.  Human immunodeficiency virus positive.  4.  Nausea.   DISCHARGE DIAGNOSES:  20.  A 40 year old black female, gravida 6, para 3-0-2-3, at 33-3/[redacted] weeks      gestation with reassuring fetal heart tracing and resolution of      complaints of decreased fetal movement.  2.  Diabetes.  3.  Human immunodeficiency virus positive.  4.  Nausea, improved with Phenergan.  5.  Reassuring fetal heart tracing, BPP 8/8, ASI 16.5.   DISCHARGE MEDICATIONS:  1.  Lantus 56 units subcu at bedtime.  2.  Humalog 15 units subcu q.a.c.  3.  Kaletra 2 tabs p.o. b.i.d.  4.  Truvada 1 tab p.o. daily.  5.  AZT 1 tab p.o. b.i.d.  6.  Prenatal vitamin 1 tab p.o. daily.  7.  Tylenol 500 mg p.o. q.4-6h. p.r.n. headache.  8.  Phenergan 12.5 mg p.o. q.6h. p.r.n. nausea.   HISTORY OF PRESENT ILLNESS:  Brenda [name] was admitted to the hospital at 58-  2/[redacted] weeks gestation with complaints of decreased fetal movement.  The  patient was placed on continuous external fetal monitoring and toco.  The  patient had a BPP with AFI, and routine prenatal labs including a urine drug  screen and CMP.   HOSPITAL COURSE:  Problem 1. The patient did fairly well throughout her  hospitalization with only complaints of occasional nausea in the afternoon  and headache which is normal for her.  The patient was instructed on a  diabetic diet and was given a teaching form for her diet.  The patient was  taking 2200 calorie ADA diet at the time of discharge.   Problem 2. Diabetes mellitus.  The patient's fasting blood sugar on August 13, 2004, was 159.  The patient did, however, have a [employer] sandwich at  midnight.  She was maintained on her current home dose of Lantus 52 units  subcu at bedtime and Humalog 15 units subcu q.a.c. during her  hospitalization.  However, at discharge, the patient's Lantus will be  increased to 52 units subcu at bedtime.  The patient is instructed to  continue checking her fingerstick blood glucoses at home and maintain a  fasting less than 100.  The patient is advised that her postprandial  glucoses should be 130 or less.   Problem 3. Human immunodeficiency virus positive status.  The patient was  continued on her home regimen and will continue her current medications at  discharge.  The patient did have liver function tests checked given her  current medication of Truvada.  The patient's LFTs are within normal limits.   Problem 4. Decreased fetal movement.  The patient began to feel the baby  move on August 13, 2004, as per usual.  The baby's BPP was 8/8 with AFI 16.4.  Continuous external fetal heart monitoring revealed a baseline heart rate in  140s with positive variability and accelerations.  No decelerations were  noted.  The patient was not noted to have any contractions on toco.  The  patient denied any contractions, vaginal bleeding, or loss of fluid during  her hospitalization.   Problem 5. The patient complained of nausea primarily in the afternoons.  The patient was given some Phenergan 12.5 mg IV x1-time dose with resolution  of her nausea.  The patient is requesting Phenergan to use at home for her  nausea, which she says she experiences every afternoon.   Problem 6. The patient complained of a headache the morning of her discharge  on August 13, 2004, which is usual per her.  The patient states that this is  her typical migraine headaches, and  was given Tylenol 500 mg the morning of  her discharge.   Problem 7. The patient had routine labs checked including a urine drug  screen which was negative.  The patient's CMP was normal except for glucose  155, albumin 2.8.  CBC showed hemoglobin 9.8, hematocrit 29.5, and platelet  count 333.   CONDITION ON DISCHARGE:  The patient was discharged to home in stable  condition.  She needs to be followed up in the high-risk clinic on Monday  for MST.  She is also instructed to increase her Lantus to 56 units subcu at  bedtime, and otherwise continue her current medications.  The patient is  given pre-term labor precautions and advised to drink plenty of clear  fluids.  The patient is also advised to maintain a 2200 calorie ADA diet per  instructions.   Dictated by:  Consuelo Pandy, M.D.      OR/MEDQ  D:  08/13/2004  T:  08/13/2004  Job:  [id number]

## 2010-10-23 NOTE — Discharge Summary (Signed)
[facility]  Patient:    Brenda [name], Brenda [name]                        MRN: [id number] Adm. Date:  [id number] Disc. Date: [id number] Attending:  Tammi Sou Dictator:   Beckham CC:         [facility]                           Discharge Summary  PROCEDURE:                    None.  LABORATORY DATA:              Urinalysis negative except for trace leukocyte esterase and a few bacteria.  White blood cell count 4.2, hemoglobin 11.2, hematocrit 31, platelets 246, RPR is pending.  Urine culture is pending.  DISCHARGE DIAGNOSES:          1. Intrauterine pregnancy at 35-5/[redacted] weeks gestation.                               2. False labor.                               3. Gestational diabetes mellitus.                               4. Human immunodeficiency virus positive.                               5. Chronic headaches.  HOSPITAL COURSE:              [name] is a 40 year old, gravida 7, para 2-0-4-2, at 35-4/[redacted] weeks gestation who was sent to maternity admissions unit after being  evaluated at the [facility] for possible labor.  At the [facility] he was noted to have cervical change to 2 cm, 70%, and -3.  She was sent to maternity admissions unit, was having contractions every five minutes that were painful, nd her cervix was 3 cm, 70 to 80%, and -2.  Her vital signs were stable at that time. Because of her HIV status and her multiparous state and it was felt that she should be monitored closely in the hospital rather than discharged to home with possible precipitous delivery.  She was admitted to labor and delivery, an IV was placed, and she was started on IV fluids.  She underwent continuous fetal monitoring and continuous monitoring of her uterine activity.  Over the course of the first few hours, she had marked decrease in her uterine activity and was no longer uncomfortable with her contractions.  She continued to have  intermittent contractions overnight, but was without cervical change the following morning. Of note, during the hospitalization, she was prescribed her medications for HIV which apparently she has been taking all at one time at home.  She declined taking these as prescribed which were both twice and three times a day and reported that she  would only take them if she was given them all at one time.  Because of the risk of developing rapid resistance without taking the medications, it was felt that  she would be at less risk if she took all of her medications at one time instead of  missing her dosages and these were given.  She agreed to assume responsibility f taking them this way.  She had no complications from this.  She did complain of a headache overnight, but has chronic headaches and there was no difference between this headache and those that she typically experiences.  She had no visual symptoms, no changes in her reflexes, and blood pressures were stable.  A fasting blood glucose was done on the day of discharge and was noted to be 96.  On fetal heart rate monitoring, the babys heart rate was typically in the 120s to 130s and was reactive at times and showed no signs of distress.  On the day of discharge, the patient is stable.  She has had no cervical change and will be discharged to home.  Labor precautions were reviewed with her and the importance of early presentation to the hospital when she is in labor was strongly emphasized.  DISCHARGE MEDICATIONS:        1. D-40 20 mg one p.o. b.i.d.                               2. 3TC 150 mg one p.o. b.i.d.                               3. Nelfinavir 250 mg three pills t.i.d.                               4. Diflucan 400 mg two p.o. q.d.                               5. Bactrim DS one p.o. q.Monday, Wednesday, Friday.  This is how her medications are prescribed, but as mentioned above, she takes them all at one time.   She should also continue her prenatal vitamins.  FOLLOW-UP:                    She has a follow-up appointment scheduled next Wednesday at the [facility] which she was encouraged to keep.  Labor precautions were reviewed with her.  This case has been discussed with Roseanna Rainbow, M.D. DD:  09/17/99 TD:  09/17/99 Job: [id number]

## 2010-10-23 NOTE — Discharge Summary (Signed)
[name], Brenda [name]                 ACCOUNT NO.:  [id number]   MEDICAL RECORD NO.:  [id number]          PATIENT TYPE:  INP   LOCATION:  9103                          [facility]   PHYSICIAN:  Conni Elliot, M.D.DATE OF BIRTH:  02-09-1971   DATE OF ADMISSION:  09/11/2004  DATE OF DISCHARGE:                                 DISCHARGE SUMMARY   ADMISSION DIAGNOSIS:  1.  Labor in a 37 and six-sevenths week pregnancy.  2.  Diabetes.  3.  Human immunodeficiency virus.   DISCHARGE DIAGNOSES:  1.  Delivery of a term female infant.  2.  Human immunodeficiency virus.  3.  Diabetes.   DISCHARGE MEDICATIONS:  1.  Ibuprofen 600 mg one p.o. q.6h. p.r.n.  2.  Prenatal vitamins one p.o. daily x6 weeks.  3.  Glucotrol 10 mg p.o. daily.  4.  Kaletra two tablets p.o. b.i.d. 250/50 mg.  5.  Truvada 200/30 mg one p.o. daily.   FOLLOW-UP ITEMS:  Follow up at [facility] in 6 weeks.   HOSPITAL COURSE:  This is a 40 year old G6 P3-0-2-3 that was admitted at 71  and 6 weeks for induction of labor secondary to nonreactive NST, BPP of 6/8,  with prenatal care significant for uncontrolled type 2 diabetes and  nondetectable HIV load. The patient presented to [facility]  complaining of contractions and on cervical exam was found to be loose, 70%  effaced, vertex at a -1. On hospital day #1 the patient delivered by  spontaneous vaginal delivery of a female infant with Apgars of 8 and 9,  estimated blood loss of 400 mL. No lacerations. Mother and baby stable. On  postpartum day #2 the patient underwent a bilateral tubal ligation,  tolerated the procedure well. Estimated blood loss during the procedure was  minimal. She was discharged on postpartum day #3 at which time she was  having some back and abdominal pain well controlled with p.o. medications  and additionally had some concerns about when formula would become available  for her baby through [facility]. Social work consult was called prior to  discontinue and awaiting the results of that consult and then the patient to  be discharged.      TH/MEDQ  D:  09/15/2004  T:  09/15/2004  Job:  [id number]

## 2010-10-23 NOTE — Discharge Summary (Signed)
[facility]  Patient:    Brenda [name], Brenda [name]                        MRN: [id number] Adm. Date:  [id number] Disc. Date: 08/22/99 Attending:  Tammi Sou Dictator:   Nolon Nations, M.D.                           Discharge Summary  DATE OF BIRTH:                1970-11-25.  ADMISSION DIAGNOSES:          1. Urinary tract infection.                               2. Cervicitis.  DISCHARGE DIAGNOSIS:          Cervicitis.  SERVICE:                      OB teaching service.  ATTENDING PHYSICIAN:          Dr. Coral Ceo.  RESIDENT PHYSICIAN:           Dr. Cathlean Cower.  ADMISSION HISTORY:            [name] is a 40 year old G7, P 2-0-4-2, 31 and , who presented with bleeding after wiping when going to the bathroom.  She has come to the _______ for an ultrasound and described her history of bleeding x 1. She had bleeding x 1 again in the unit.  She also complains of about a one-week history of vaginal pressure and pain in the lower quadrants bilaterally for the last week. She had no dysuria, no nausea, no vomiting, fever, or chills.  Please refer to he Admit Note for a more complete History and Physical.  HOSPITAL COURSE:              The patient was admitted for care of her UTI and cervicitis given her HIV status.  The patient is HIV positive and taking five HIV medications and is followed at Dr. Loleta Books clinic.  She had a wet prep with  many bacteria, a few wbcs, a few yeast, no Trichomonas and clue.  Her UA showed  large blood and small leukocyte esterase with 11-20 wbcs.  She was started on cefotetan 2 g IV q.12 to cover both.  The patient had occasional uterine contractions on her first day of admission with continued vaginal pressure.  On day #2 of admission the patient had significant contractions every two to three minutes that lasted four to five minutes.  The patient was not feeling the contractions. She  was given subcu terbutaline x 1.  The patients cervix on admission was fingertip, 40-50%, and breech per ultrasound.  Urine culture revealed only 3000  colonies per ml which was considered insignificant.  The patient had significant resolution of her vaginal pain by March 16 and no further contractions.  Social  work was consulted in order to assess the patients health situation.  The patient lives with her 42 and 84 year old children who are fairly independent and has help from siblings apparent.  She was shown to not be compliant with her HIV medications while in-house, only taking her medicines q.d. as opposed to their b.i.d. and t.i.d. dosing.  She was adamant that she had always been  taking her medications  this way and was unwilling to change her habits.  She also had a _______ that Dr. Loleta Books office assistant was called and informed about her _______.  She was discharged on March 17 with no uterine contractions for 36 hours.  She is going to be followed up at the high risk clinic and ID clinic.  CONDITION ON DISCHARGE:       Good.  DISCHARGE MEDICATIONS:        1. Colace 100 mg p.o. b.i.d. p.r.n. constipation.                               2. Prenatal vitamins.                               3. Fioricet one to two tabs q.4h. p.r.n. headache.  FOLLOWUP:                     1. Infectious disease clinic Monday, April 9 at                                  11 a.m.                               2. High risk clinic will call the patient on Monday,                                  March 19 to schedule appointment. DD:  08/22/99 TD:  08/22/99 Job: [id number]

## 2010-10-23 NOTE — Op Note (Signed)
NAMELAMAR, NAEF NO.:  [id number]   MEDICAL RECORD NO.:  [id number]          PATIENT TYPE:  INP   LOCATION:  9103                          FACILITY:  [facility]   PHYSICIAN:  Lesly Dukes, M.D. DATE OF BIRTH:  08/22/1970   DATE OF PROCEDURE:  09/14/2004  DATE OF DISCHARGE:                                 OPERATIVE REPORT   PREOPERATIVE DIAGNOSIS:  40 year old para 4-0-2-4, desiring permanent  sterilization.   POSTOPERATIVE DIAGNOSIS:  40 year old para 4-0-2-4, desiring permanent  sterilization.   PROCEDURE:  Postpartum BTL with Filshie clip.   SURGEON:  Lesly Dukes, M.D.   ASSISTANT:  Jon Gills, M.D.   ANESTHESIA:  General.   ESTIMATED BLOOD LOSS:  Minimal.   PATHOLOGY:  None.   DESCRIPTION OF PROCEDURE:  After informed consent was obtained, the patient  was taken to the operating room where general anesthesia was induced.  The  patient was in the dorsal supine position and prepped and draped in the  usual sterile fashion.  The bladder was in-and-out catheterized.  Umbilical  skin incision was made with the scalpel and carried down to the fascia.  The  fascia was incised in the midline and using blunt and sharp dissection the  peritoneum was isolated.  The peritoneum was then tented up with Hemostats,  entered sharply with Metzenbaum scissors.  Retractors were placed into the  abdomen.  The patient was placed in Trendelenburg and the left fallopian  tube was isolated and tented up with a Babcock.  The Filshie clip was  applied across the tube.  The tube was returned to the abdomen.  The right  fallopian tube was then isolated, tented up with a Babcock, and a Filshie  clip was placed across the right fallopian tube.  The right fallopian tube  was returned to the abdomen.  Good hemostasis was noted from every aspect.  The fascia was closed with 0 Vicryl in a running fashion.  The subcutaneous  tissue was found to be hemostatic and the skin  was closed with 3-0 Vicryl in  a subcuticular fashion.  The patient tolerated the procedure well.  Needle,  sponge, and instrument counts correct x2.  The patient went to the recovery  room in stable condition.      KHL/MEDQ  D:  09/14/2004  T:  09/14/2004  Job:  [id number]

## 2010-10-23 NOTE — Discharge Summary (Signed)
[name], Brenda [name] NO.:  [id number]   MEDICAL RECORD NO.:  [id number]          PATIENT TYPE:  WOC   LOCATION:  WOC                          FACILITY:  [facility]   PHYSICIAN:  Jon Gills, M.D.  DATE OF BIRTH:  November 18, 1970   DATE OF ADMISSION:  09/01/2004  DATE OF DISCHARGE:  09/03/2004                                 DISCHARGE SUMMARY   ADMISSION DIAGNOSES:  50.  A 40 year old G6, P3-0-2-3 at 36-1 with poorly controlled diabetes      mellitus class B.  2.  Positive HIV status.  3.  Reassuring fetal heart tracing.   DISCHARGE DIAGNOSES:  72.  A 39 year old G6, P3-0-2-3 at 8-3 with poorly controlled diabetes      mellitus class B.  2.  Positive HIV status.  3.  Reassuring fetal heart tracing.   DISCHARGE MEDICATIONS:  1.  Lantus 56 units subcutaneously q.h.s.  2.  Humalog 15 units subcutaneously q.a.c.  3.  Kaletra 2 tablets p.o. b.i.d.  4.  Truvada 1 p.o. daily.  5.  AZT 1 p.o. b.i.d.  6.  Prenatal vitamin 1 p.o. daily.   ADMISSION HISTORY:  [name] was admitted to the [facility] for  glycemic control.  She was also complaining of decreased fetal movement at  the time of her clinic visit at the [facility].   HOSPITAL COURSE:  As far as her blood glucose, her sugars were much improved  in the hospital on an ADA diet with her home regimen of insulin.  Her sugars  are not controlled outside of the hospital due to noncompliance.  PIH panel  was done, which showed a creatinine of 0.9 and a uric acid of 6.5 but the  remainder of the panel was normal.  A 24-hour urine was collected during the  hospitalization.  An ultrasound for growth was done, which showed an  estimated fetal weight of 346 g, which puts her at the 95th percentile.  Her  abdominal circumference was four weeks ahead of dates.  The fetus is vertex  and the fluid is normal.   HIV status, she was continued on her antiretrovirals.  Her viral load is  undetectable and so she can  have a vaginal delivery if her fetal size  permits.   CONDITION ON DISCHARGE:  The patient was discharged to home in stable  condition.  She does have a GBS pending, as well as her 24-hour urine  pending.   FOLLOW UP:  Following discharge for antenatal testing and a [facility]  appointment on September 09, 2004.   DISCHARGE INSTRUCTIONS:  Again it was stressed to the patient the importance  of glycemic control.  The risks of poor glycemia were discussed including  possible death to both patient and fetus.  The pros and cons of a vaginal  delivery versus c-section were also discussed.  The patient does desire a c-  section due to concerns with pain control, but after the discussion the  patient is less reluctant to try a vaginal delivery, since at this point  there are  no medical indications for a vaginal delivery.  She does need to  have an early induction due to her poor glycemic control.      LC/MEDQ  D:  09/03/2004  T:  09/03/2004  Job:  [id number]

## 2010-10-23 NOTE — Discharge Summary (Signed)
NAMEKATYA, Brenda [name] NO.:  [id number]   MEDICAL RECORD NO.:  [id number]          PATIENT TYPE:  WOC   LOCATION:  WOC                          FACILITY:  [facility]   PHYSICIAN:  Jon Gills, M.D.  DATE OF BIRTH:  01/04/1971   DATE OF ADMISSION:  DATE OF DISCHARGE:                                 DISCHARGE SUMMARY   ADDENDUM:  On September 04, 2004, the patient was kept overnight due to a  nonreactive NST with a 6/8 BPP, tube taken off for breathing.  Overnight she  had an uncomplicated course.  BPP done on the day of discharge was 8/8 with  an NST that was not completely reactive and only showed some 10 x 10  accelerations, giving her a total of 8/10 which is reassuring.  She is going  to follow up on the 3rd of April for antenatal testing in high risk clinic  and then a high risk clinic appointment on the 5th.  The patient understands  that she needs to control her sugars and if she has severe hyperglycemia or  decreased fetal movement over the weekend before her NST on the 3rd, she  should return to maternity admissions.  The patient was agreeable to this  and agreed to continue her medications and diet.      LC/MEDQ  D:  09/04/2004  T:  09/04/2004  Job:  [id number]

## 2011-03-01 LAB — DIFFERENTIAL
Basophils Absolute: 0
Basophils Relative: 0
Eosinophils Absolute: 0.2
Monocytes Relative: 9
Neutro Abs: 4.6
Neutrophils Relative %: 76

## 2011-03-01 LAB — POCT I-STAT CREATININE
Creatinine, Ser: 0.9
Operator id: 146091

## 2011-03-01 LAB — I-STAT 8, (EC8 V) (CONVERTED LAB)
Acid-Base Excess: 1
Bicarbonate: 28.4 — ABNORMAL HIGH
Chloride: 103
HCT: 39
Operator id: 146091
TCO2: 30
pCO2, Ven: 57.2 — ABNORMAL HIGH
pH, Ven: 7.304 — ABNORMAL HIGH

## 2011-03-01 LAB — URINALYSIS, ROUTINE W REFLEX MICROSCOPIC
Ketones, ur: NEGATIVE
Leukocytes, UA: NEGATIVE
Nitrite: NEGATIVE
Protein, ur: NEGATIVE
Urobilinogen, UA: 1

## 2011-03-01 LAB — CBC
MCHC: 33
Platelets: 335
RBC: 4.43

## 2011-03-01 LAB — T-HELPER CELL (CD4) - (RCID CLINIC ONLY): CD4 % Helper T Cell: 28 — ABNORMAL LOW

## 2011-03-05 LAB — DIFFERENTIAL
Basophils Absolute: 0
Eosinophils Absolute: 0.1
Eosinophils Relative: 1
Lymphocytes Relative: 14
Lymphs Abs: 1.3
Neutrophils Relative %: 79 — ABNORMAL HIGH

## 2011-03-05 LAB — COMPREHENSIVE METABOLIC PANEL
ALT: 123 — ABNORMAL HIGH
AST: 190 — ABNORMAL HIGH
Albumin: 3.3 — ABNORMAL LOW
Alkaline Phosphatase: 97
BUN: 10
CO2: 26
Calcium: 8.7
Chloride: 103
Creatinine, Ser: 0.75
GFR calc Af Amer: >60
GFR calc non Af Amer: >60
Glucose, Bld: 235 — ABNORMAL HIGH
Potassium: 3.7
Sodium: 136
Total Bilirubin: 0.6
Total Protein: 6.7

## 2011-03-05 LAB — CBC
HCT: 29.3 — ABNORMAL LOW
Hemoglobin: 9.1 — ABNORMAL LOW
Hemoglobin: 9.3 — ABNORMAL LOW
MCHC: 31
MCHC: 31.2
MCV: 75.1 — ABNORMAL LOW
MCV: 75.7 — ABNORMAL LOW
Platelets: 321
RBC: 3.87
RBC: 3.95
RDW: 17.1 — ABNORMAL HIGH
WBC: 7.7
WBC: 9.3

## 2011-03-05 LAB — URINE MICROSCOPIC-ADD ON

## 2011-03-05 LAB — RAPID URINE DRUG SCREEN, HOSP PERFORMED
Amphetamines: NOT DETECTED
Barbiturates: NOT DETECTED
Benzodiazepines: NOT DETECTED
Cocaine: NOT DETECTED
Opiates: POSITIVE — AB
Tetrahydrocannabinol: NOT DETECTED

## 2011-03-05 LAB — URINALYSIS, ROUTINE W REFLEX MICROSCOPIC
Ketones, ur: NEGATIVE
Leukocytes, UA: NEGATIVE
Nitrite: NEGATIVE
Protein, ur: 100 — AB

## 2011-03-05 LAB — BASIC METABOLIC PANEL
BUN: 4 — ABNORMAL LOW
Calcium: 8.5
Creatinine, Ser: 0.76
GFR calc Af Amer: >60
GFR calc non Af Amer: >60

## 2011-03-15 LAB — URINALYSIS, ROUTINE W REFLEX MICROSCOPIC
Bilirubin Urine: NEGATIVE
Leukocytes, UA: NEGATIVE
Nitrite: NEGATIVE
Specific Gravity, Urine: 1.043 — ABNORMAL HIGH
Urobilinogen, UA: 0.2

## 2011-03-15 LAB — PREGNANCY, URINE: Preg Test, Ur: NEGATIVE

## 2011-03-15 LAB — BASIC METABOLIC PANEL
BUN: 8
CO2: 29
Calcium: 9.1
Chloride: 101
Creatinine, Ser: 0.68

## 2011-03-15 LAB — URINE MICROSCOPIC-ADD ON

## 2011-03-19 LAB — T-HELPER CELL (CD4) - (RCID CLINIC ONLY): CD4 T Cell Abs: 490

## 2011-04-05 ENCOUNTER — Other Ambulatory Visit: Payer: Self-pay | Admitting: *Deleted

## 2011-04-05 DIAGNOSIS — B20 Human immunodeficiency virus [HIV] disease: Secondary | ICD-10-CM

## 2011-04-05 MED ORDER — LOPINAVIR-RITONAVIR 200-50 MG PO TABS: 2.0000 | ORAL_TABLET | Freq: Every day | ORAL | Status: DC

## 2011-04-05 MED ORDER — LOPINAVIR-RITONAVIR 200-50 MG PO TABS: 2.0000 | ORAL_TABLET | Freq: Two times a day (BID) | ORAL | Status: DC

## 2011-04-05 MED ORDER — EMTRICITABINE-TENOFOVIR DF 200-300 MG PO TABS: 1.0000 | ORAL_TABLET | Freq: Every day | ORAL | Status: DC

## 2011-05-06 ENCOUNTER — Encounter: Payer: Self-pay | Admitting: *Deleted

## 2011-06-09 ENCOUNTER — Telehealth: Payer: Self-pay | Admitting: *Deleted

## 2011-06-09 NOTE — Telephone Encounter (Signed)
Called patient to have her make an appointment. She was transferred to front desk to make one.

## 2011-06-14 ENCOUNTER — Other Ambulatory Visit: Payer: Self-pay | Admitting: Infectious Diseases

## 2011-06-14 DIAGNOSIS — Z113 Encounter for screening for infections with a predominantly sexual mode of transmission: Secondary | ICD-10-CM

## 2011-06-14 DIAGNOSIS — Z79899 Other long term (current) drug therapy: Secondary | ICD-10-CM

## 2011-06-14 DIAGNOSIS — B20 Human immunodeficiency virus [HIV] disease: Secondary | ICD-10-CM

## 2011-06-15 ENCOUNTER — Other Ambulatory Visit: Payer: Self-pay | Admitting: Infectious Diseases

## 2011-06-15 ENCOUNTER — Other Ambulatory Visit (INDEPENDENT_AMBULATORY_CARE_PROVIDER_SITE_OTHER): Payer: Medicare Other

## 2011-06-15 DIAGNOSIS — Z79899 Other long term (current) drug therapy: Secondary | ICD-10-CM | POA: Diagnosis not present

## 2011-06-15 DIAGNOSIS — Z21 Asymptomatic human immunodeficiency virus [HIV] infection status: Secondary | ICD-10-CM

## 2011-06-15 DIAGNOSIS — B20 Human immunodeficiency virus [HIV] disease: Secondary | ICD-10-CM

## 2011-06-15 DIAGNOSIS — Z113 Encounter for screening for infections with a predominantly sexual mode of transmission: Secondary | ICD-10-CM

## 2011-06-15 LAB — CBC WITH DIFFERENTIAL/PLATELET
Basophils Relative: 1 % (ref 0–1)
Eosinophils Absolute: 0.2 10*3/uL (ref 0.0–0.7)
HCT: 31.7 % — ABNORMAL LOW (ref 36.0–46.0)
Hemoglobin: 9.4 g/dL — ABNORMAL LOW (ref 12.0–15.0)
MCH: 20.8 pg — ABNORMAL LOW (ref 26.0–34.0)
MCHC: 29.7 g/dL — ABNORMAL LOW (ref 30.0–36.0)
Monocytes Absolute: 0.5 10*3/uL (ref 0.1–1.0)
Monocytes Relative: 8 % (ref 3–12)

## 2011-06-15 LAB — COMPREHENSIVE METABOLIC PANEL
Alkaline Phosphatase: 93 U/L (ref 39–117)
BUN: 11 mg/dL (ref 6–23)
Creat: 0.75 mg/dL (ref 0.50–1.10)
Glucose, Bld: 237 mg/dL — ABNORMAL HIGH (ref 70–99)
Total Bilirubin: 0.3 mg/dL (ref 0.3–1.2)

## 2011-06-15 LAB — LIPID PANEL
HDL: 39 mg/dL — ABNORMAL LOW (ref >39)
LDL Cholesterol: 128 mg/dL — ABNORMAL HIGH (ref 0–99)
Triglycerides: 125 mg/dL (ref <150)
VLDL: 25 mg/dL (ref 0–40)

## 2011-06-15 LAB — RPR

## 2011-06-17 LAB — HIV-1 RNA QUANT-NO REFLEX-BLD: HIV-1 RNA Quant, Log: <1.3 {Log} (ref <1.30)

## 2011-06-29 DIAGNOSIS — L723 Sebaceous cyst: Secondary | ICD-10-CM | POA: Diagnosis not present

## 2011-06-30 ENCOUNTER — Ambulatory Visit (INDEPENDENT_AMBULATORY_CARE_PROVIDER_SITE_OTHER): Payer: Medicare Other | Admitting: Infectious Diseases

## 2011-06-30 ENCOUNTER — Encounter: Payer: Self-pay | Admitting: Infectious Diseases

## 2011-06-30 DIAGNOSIS — L02219 Cutaneous abscess of trunk, unspecified: Secondary | ICD-10-CM

## 2011-06-30 DIAGNOSIS — E1165 Type 2 diabetes mellitus with hyperglycemia: Secondary | ICD-10-CM | POA: Diagnosis not present

## 2011-06-30 DIAGNOSIS — L03312 Cellulitis of back [any part except buttock]: Secondary | ICD-10-CM

## 2011-06-30 DIAGNOSIS — B20 Human immunodeficiency virus [HIV] disease: Secondary | ICD-10-CM

## 2011-06-30 DIAGNOSIS — E118 Type 2 diabetes mellitus with unspecified complications: Secondary | ICD-10-CM

## 2011-06-30 DIAGNOSIS — R21 Rash and other nonspecific skin eruption: Secondary | ICD-10-CM | POA: Insufficient documentation

## 2011-06-30 MED ORDER — SULFAMETHOXAZOLE-TRIMETHOPRIM 400-80 MG PO TABS: 1.0000 | ORAL_TABLET | Freq: Two times a day (BID) | ORAL | Status: AC

## 2011-06-30 NOTE — Assessment & Plan Note (Signed)
She is doing ok with this, knows she needs better control. Needs ophtho exam.

## 2011-06-30 NOTE — Assessment & Plan Note (Addendum)
She is doing very well. Will continue her current rx (KLT/TRV).  Her lipids are good. She is offered condoms (refuses), see her back in 5-6 months. Refuses flu shot.  Will get GYN with Dr Stefano Gaul.

## 2011-06-30 NOTE — Progress Notes (Signed)
  Subjective:    Patient ID: Brenda [name], female    DOB: 08-24-1970, 41 y.o.   MRN: [id number]  HPI 41 yo F with HIV+, DM. Last CD4 650, VL <20 (06-15-11). Was seen by PMD for boil on her back. She has had for 1 week, "daughter popped it and squeezed it out". Was started on "something that starts with a c".  No problems taking ART. FSG have been "alright, up and down". PAP/mammo due, got letter in mail. Has not seen ophtho in the last year.    Review of Systems  Constitutional: Negative for appetite change and unexpected weight change.  Respiratory: Negative for cough and shortness of breath.   Cardiovascular: Negative for chest pain.  Gastrointestinal: Negative for diarrhea and constipation.  Genitourinary: Negative for dysuria.  Neurological: Positive for headaches. Negative for numbness.       Objective:   Physical Exam  Constitutional: She appears well-developed and well-nourished.  Eyes: EOM are normal. Pupils are equal, round, and reactive to light.  Neck: Neck supple.  Cardiovascular: Normal rate and regular rhythm.   Pulmonary/Chest: Effort normal and breath sounds normal.  Abdominal: Soft. Bowel sounds are normal. There is no tenderness.  Lymphadenopathy:    She has no cervical adenopathy.  Skin:             Assessment & Plan:

## 2011-06-30 NOTE — Assessment & Plan Note (Addendum)
Would consider she has had a MRSA furuncle. Will change her cephalexin to bactrim for 10 days.

## 2011-07-08 NOTE — Progress Notes (Signed)
Subjective:    Patient ID: Brenda [name] is a 41 y.o. female.  Chief Complaint: HIV Follow-up Visit ANNAMAY LAYMON is here for follow-up of HIV infection. She is feeling unchanged since her last visit.  She claims continued adherence to therapy with good tolerance and no complications. There are not additional complaints.   Data Review: Diagnostic studies reviewed.  Review of Systems - General ROS: negative for - chills, fatigue or fever Psychological ROS: negative for - anxiety, behavioral disorder, concentration difficulties or depression Breast ROS: negative for breast lumps Respiratory ROS: no cough, shortness of breath, or wheezing Cardiovascular ROS: no chest pain or dyspnea on exertion Gastrointestinal ROS: no abdominal pain, change in bowel habits, or black or bloody stools Genito-Urinary ROS: no dysuria, trouble voiding, or hematuria Dermatological ROS: negative for rash and skin lesion changes  Objective:  General appearance: alert, cooperative, appears stated age and no distress Head: Normocephalic, without obvious abnormality, atraumatic Neck: no adenopathy, no carotid bruit, no JVD, supple, symmetrical, trachea midline and thyroid not enlarged, symmetric, no tenderness/mass/nodules Resp: clear to auscultation bilaterally Cardio: regular rate and rhythm, S1, S2 normal, no murmur, click, rub or gallop GI: soft, non-tender; bowel sounds normal; no masses,  no organomegaly Extremities: extremities normal, atraumatic, no cyanosis or edema Neurologic: Alert and oriented X 3, normal strength and tone. Normal symmetric reflexes. Normal coordination and gait Skin:  No active lesions or rashes noted.    Psych:  No vegetative signs or delusional behaviors noted.    Laboratory: From 08/18/10 ,  CD4 count was 530 c/cmm @ 30%. Viral load <20 copies/ml.     Assessment/Plan:   HIV DISEASE Clinically stable on current regimen. Continue present management.     Counseling provided  on prevention of transmission of HIV. Medication adherence discussed with patient. Follow up visit in 3 months with labs 2 weeks prior to appointment. Patient acknowledged information provided to them and agreed with plan of care.        Yuna Pizzolato A. Sundra Aland, MS, [facility] [phone]  07/08/2011, 2:23 PM

## 2011-07-08 NOTE — Assessment & Plan Note (Signed)
Clinically stable on current regimen. Continue present management.     Counseling provided on prevention of transmission of HIV. Medication adherence discussed with patient. Follow up visit in 3 months with labs 2 weeks prior to appointment. Patient acknowledged information provided to them and agreed with plan of care.

## 2011-07-30 DIAGNOSIS — R51 Headache: Secondary | ICD-10-CM | POA: Diagnosis not present

## 2011-08-24 ENCOUNTER — Other Ambulatory Visit: Payer: Self-pay | Admitting: Infectious Diseases

## 2011-08-24 DIAGNOSIS — B20 Human immunodeficiency virus [HIV] disease: Secondary | ICD-10-CM

## 2011-08-26 DIAGNOSIS — M25569 Pain in unspecified knee: Secondary | ICD-10-CM | POA: Diagnosis not present

## 2011-08-26 DIAGNOSIS — G4700 Insomnia, unspecified: Secondary | ICD-10-CM | POA: Diagnosis not present

## 2011-08-26 DIAGNOSIS — D649 Anemia, unspecified: Secondary | ICD-10-CM | POA: Diagnosis not present

## 2011-08-26 DIAGNOSIS — E119 Type 2 diabetes mellitus without complications: Secondary | ICD-10-CM | POA: Diagnosis not present

## 2011-09-14 DIAGNOSIS — L0390 Cellulitis, unspecified: Secondary | ICD-10-CM | POA: Diagnosis not present

## 2011-09-14 DIAGNOSIS — L0291 Cutaneous abscess, unspecified: Secondary | ICD-10-CM | POA: Diagnosis not present

## 2011-09-21 DIAGNOSIS — R21 Rash and other nonspecific skin eruption: Secondary | ICD-10-CM | POA: Diagnosis not present

## 2011-10-13 DIAGNOSIS — L0291 Cutaneous abscess, unspecified: Secondary | ICD-10-CM | POA: Diagnosis not present

## 2011-10-13 DIAGNOSIS — M25569 Pain in unspecified knee: Secondary | ICD-10-CM | POA: Diagnosis not present

## 2011-10-13 DIAGNOSIS — D649 Anemia, unspecified: Secondary | ICD-10-CM | POA: Diagnosis not present

## 2011-10-13 DIAGNOSIS — L0390 Cellulitis, unspecified: Secondary | ICD-10-CM | POA: Diagnosis not present

## 2011-10-13 DIAGNOSIS — R21 Rash and other nonspecific skin eruption: Secondary | ICD-10-CM | POA: Diagnosis not present

## 2011-10-26 DIAGNOSIS — R51 Headache: Secondary | ICD-10-CM | POA: Diagnosis not present

## 2011-10-26 DIAGNOSIS — G4700 Insomnia, unspecified: Secondary | ICD-10-CM | POA: Diagnosis not present

## 2011-10-26 DIAGNOSIS — R21 Rash and other nonspecific skin eruption: Secondary | ICD-10-CM | POA: Diagnosis not present

## 2011-11-15 ENCOUNTER — Other Ambulatory Visit: Payer: Self-pay

## 2011-11-22 ENCOUNTER — Other Ambulatory Visit: Payer: Medicare Other

## 2011-11-22 DIAGNOSIS — B20 Human immunodeficiency virus [HIV] disease: Secondary | ICD-10-CM

## 2011-11-22 LAB — COMPREHENSIVE METABOLIC PANEL
ALT: 8 U/L (ref 0–35)
AST: 10 U/L (ref 0–37)
BUN: 8 mg/dL (ref 6–23)
Calcium: 8.7 mg/dL (ref 8.4–10.5)
Chloride: 105 mEq/L (ref 96–112)
Creat: 0.84 mg/dL (ref 0.50–1.10)
Total Bilirubin: 0.2 mg/dL — ABNORMAL LOW (ref 0.3–1.2)

## 2011-11-22 LAB — CBC WITH DIFFERENTIAL/PLATELET
Basophils Relative: 1 % (ref 0–1)
Eosinophils Absolute: 0.4 10*3/uL (ref 0.0–0.7)
Eosinophils Relative: 8 % — ABNORMAL HIGH (ref 0–5)
HCT: 29.4 % — ABNORMAL LOW (ref 36.0–46.0)
Hemoglobin: 9 g/dL — ABNORMAL LOW (ref 12.0–15.0)
MCH: 19.4 pg — ABNORMAL LOW (ref 26.0–34.0)
MCHC: 30.6 g/dL (ref 30.0–36.0)
MCV: 63.4 fL — ABNORMAL LOW (ref 78.0–100.0)
Monocytes Absolute: 0.3 10*3/uL (ref 0.1–1.0)
Monocytes Relative: 7 % (ref 3–12)
Neutro Abs: 1.7 10*3/uL (ref 1.7–7.7)

## 2011-11-23 DIAGNOSIS — R51 Headache: Secondary | ICD-10-CM | POA: Diagnosis not present

## 2011-11-23 DIAGNOSIS — IMO0001 Reserved for inherently not codable concepts without codable children: Secondary | ICD-10-CM | POA: Diagnosis not present

## 2011-11-23 DIAGNOSIS — A4102 Sepsis due to Methicillin resistant Staphylococcus aureus: Secondary | ICD-10-CM | POA: Diagnosis not present

## 2011-11-23 LAB — T-HELPER CELL (CD4) - (RCID CLINIC ONLY): CD4 T Cell Abs: 600 uL (ref 400–2700)

## 2011-11-29 ENCOUNTER — Ambulatory Visit: Payer: Self-pay | Admitting: Infectious Diseases

## 2011-12-06 ENCOUNTER — Telehealth: Payer: Self-pay | Admitting: *Deleted

## 2011-12-06 ENCOUNTER — Ambulatory Visit: Payer: Self-pay | Admitting: Infectious Diseases

## 2011-12-06 NOTE — Telephone Encounter (Signed)
Called to reschedule pts appt, she no showed today.  Transferred to front Wendall Mola

## 2011-12-23 DIAGNOSIS — F411 Generalized anxiety disorder: Secondary | ICD-10-CM | POA: Diagnosis not present

## 2011-12-23 DIAGNOSIS — R51 Headache: Secondary | ICD-10-CM | POA: Diagnosis not present

## 2011-12-23 DIAGNOSIS — E119 Type 2 diabetes mellitus without complications: Secondary | ICD-10-CM | POA: Diagnosis not present

## 2011-12-23 DIAGNOSIS — G4700 Insomnia, unspecified: Secondary | ICD-10-CM | POA: Diagnosis not present

## 2011-12-29 ENCOUNTER — Encounter: Payer: Self-pay | Admitting: Infectious Diseases

## 2011-12-29 ENCOUNTER — Other Ambulatory Visit: Payer: Self-pay | Admitting: Family Medicine

## 2011-12-29 ENCOUNTER — Ambulatory Visit (INDEPENDENT_AMBULATORY_CARE_PROVIDER_SITE_OTHER): Payer: Medicare Other | Admitting: Infectious Diseases

## 2011-12-29 VITALS — BP 128/91 | HR 102 | Temp 98.0°F | Ht 68.0 in | Wt 243.5 lb

## 2011-12-29 DIAGNOSIS — Z113 Encounter for screening for infections with a predominantly sexual mode of transmission: Secondary | ICD-10-CM | POA: Diagnosis not present

## 2011-12-29 DIAGNOSIS — L02219 Cutaneous abscess of trunk, unspecified: Secondary | ICD-10-CM | POA: Diagnosis not present

## 2011-12-29 DIAGNOSIS — B20 Human immunodeficiency virus [HIV] disease: Secondary | ICD-10-CM | POA: Diagnosis not present

## 2011-12-29 DIAGNOSIS — E1165 Type 2 diabetes mellitus with hyperglycemia: Secondary | ICD-10-CM

## 2011-12-29 DIAGNOSIS — L03319 Cellulitis of trunk, unspecified: Secondary | ICD-10-CM

## 2011-12-29 DIAGNOSIS — L03312 Cellulitis of back [any part except buttock]: Secondary | ICD-10-CM

## 2011-12-29 DIAGNOSIS — Z79899 Other long term (current) drug therapy: Secondary | ICD-10-CM | POA: Diagnosis not present

## 2011-12-29 DIAGNOSIS — N644 Mastodynia: Secondary | ICD-10-CM

## 2011-12-29 MED ORDER — CHLORHEXIDINE GLUCONATE 2 % EX LIQD: 5.0000 mL | CUTANEOUS | Status: DC

## 2011-12-29 MED ORDER — SULFAMETHOXAZOLE-TRIMETHOPRIM 800-160 MG PO TABS: 1.0000 | ORAL_TABLET | Freq: Two times a day (BID) | ORAL | Status: AC

## 2011-12-29 MED ORDER — MUPIROCIN CALCIUM 2 % NA OINT: TOPICAL_OINTMENT | Freq: Two times a day (BID) | NASAL | Status: DC

## 2011-12-29 NOTE — Assessment & Plan Note (Signed)
Will start her on bactrim, CHG baths, mupirocin. Ask derm to eval her, she has a unique rash from her infected areas.

## 2011-12-29 NOTE — Progress Notes (Signed)
  Subjective:    Patient ID: Brenda [name], female    DOB: [birth year], 41 y.o.   MRN: [id number]  HPI 41 yo F HIV+, DM2. Was seen at last visit with Boil on her back. Was treated with bactrim. Has since had recurrent boils on her face, new knot on her back (draining pus and blood). Recently started on new anbx, does not recall name. No fever or chills.  FSG at home 120s to 250.   Making apt with Dr Stefano Gaul for PAP/[place]  HIV 1 RNA Quant (copies/mL)  Date Value  11/22/2011 149*  06/15/2011 <20   08/18/2010 <20 copies/mL      CD4 T Cell Abs (cmm)  Date Value  11/22/2011 600   06/15/2011 650   08/18/2010 530       Review of Systems  Constitutional: Negative for appetite change and unexpected weight change.  Gastrointestinal: Negative for diarrhea and constipation.  Genitourinary: Negative for dysuria.  Neurological: Positive for numbness.       Numbness in hands       Objective:   Physical Exam  Constitutional: She appears well-developed and well-nourished.  HENT:  Mouth/Throat: No oropharyngeal exudate.  Eyes: EOM are normal. Pupils are equal, round, and reactive to light.  Neck: Neck supple.  Cardiovascular: Normal rate, regular rhythm and normal heart sounds.   Pulmonary/Chest: Effort normal and breath sounds normal. No respiratory distress.  Abdominal: Soft. Bowel sounds are normal. She exhibits no distension. There is no tenderness.  Lymphadenopathy:    She has no cervical adenopathy.  Skin: Rash noted.             Assessment & Plan:

## 2011-12-29 NOTE — Assessment & Plan Note (Signed)
She cont to f/u with PCP, greatly appreciate their partnering with [place].

## 2011-12-29 NOTE — Assessment & Plan Note (Signed)
Doing well except for slightly detectable virus. No change in meds. She admits to non-adherence. Will see her back in 6 months.

## 2012-01-05 DIAGNOSIS — G4700 Insomnia, unspecified: Secondary | ICD-10-CM | POA: Diagnosis not present

## 2012-01-06 ENCOUNTER — Ambulatory Visit
Admission: RE | Admit: 2012-01-06 | Discharge: 2012-01-06 | Disposition: A | Discharge status: Home / Self Care | Payer: Medicare Other | Source: Ambulatory Visit | Attending: Family Medicine | Admitting: Family Medicine

## 2012-01-06 DIAGNOSIS — N644 Mastodynia: Secondary | ICD-10-CM | POA: Diagnosis not present

## 2012-01-06 DIAGNOSIS — N6489 Other specified disorders of breast: Secondary | ICD-10-CM | POA: Diagnosis not present

## 2012-01-17 ENCOUNTER — Encounter: Payer: Self-pay | Admitting: Obstetrics and Gynecology

## 2012-01-17 ENCOUNTER — Other Ambulatory Visit: Payer: Self-pay | Admitting: Licensed Clinical Social Worker

## 2012-01-17 DIAGNOSIS — B20 Human immunodeficiency virus [HIV] disease: Secondary | ICD-10-CM

## 2012-01-17 MED ORDER — LOPINAVIR-RITONAVIR 200-50 MG PO TABS: 2.0000 | ORAL_TABLET | Freq: Two times a day (BID) | ORAL | Status: DC

## 2012-01-17 MED ORDER — EMTRICITABINE-TENOFOVIR DF 200-300 MG PO TABS: 1.0000 | ORAL_TABLET | Freq: Every day | ORAL | Status: DC

## 2012-01-18 ENCOUNTER — Encounter (HOSPITAL_COMMUNITY): Payer: Self-pay | Admitting: *Deleted

## 2012-01-18 ENCOUNTER — Emergency Department (HOSPITAL_COMMUNITY)
Admission: EM | Admit: 2012-01-18 | Discharge: 2012-01-19 | Disposition: A | Discharge status: Home / Self Care | Payer: Medicare Other | Attending: Emergency Medicine | Admitting: Emergency Medicine

## 2012-01-18 DIAGNOSIS — Z794 Long term (current) use of insulin: Secondary | ICD-10-CM | POA: Diagnosis not present

## 2012-01-18 DIAGNOSIS — K649 Unspecified hemorrhoids: Secondary | ICD-10-CM | POA: Diagnosis not present

## 2012-01-18 DIAGNOSIS — Z21 Asymptomatic human immunodeficiency virus [HIV] infection status: Secondary | ICD-10-CM | POA: Insufficient documentation

## 2012-01-18 DIAGNOSIS — E669 Obesity, unspecified: Secondary | ICD-10-CM | POA: Diagnosis not present

## 2012-01-18 DIAGNOSIS — Z79899 Other long term (current) drug therapy: Secondary | ICD-10-CM | POA: Insufficient documentation

## 2012-01-18 DIAGNOSIS — E119 Type 2 diabetes mellitus without complications: Secondary | ICD-10-CM | POA: Insufficient documentation

## 2012-01-18 DIAGNOSIS — K625 Hemorrhage of anus and rectum: Secondary | ICD-10-CM

## 2012-01-18 DIAGNOSIS — M1710 Unilateral primary osteoarthritis, unspecified knee: Secondary | ICD-10-CM | POA: Insufficient documentation

## 2012-01-18 LAB — URINALYSIS, ROUTINE W REFLEX MICROSCOPIC
Glucose, UA: NEGATIVE mg/dL
Ketones, ur: NEGATIVE mg/dL
Leukocytes, UA: NEGATIVE
Nitrite: NEGATIVE
Specific Gravity, Urine: 1.026 (ref 1.005–1.030)
pH: 5.5 (ref 5.0–8.0)

## 2012-01-18 LAB — COMPREHENSIVE METABOLIC PANEL
Albumin: 3.8 g/dL (ref 3.5–5.2)
BUN: 11 mg/dL (ref 6–23)
Calcium: 8.9 mg/dL (ref 8.4–10.5)
Creatinine, Ser: 0.89 mg/dL (ref 0.50–1.10)
GFR calc Af Amer: >90 mL/min (ref >90)
Glucose, Bld: 163 mg/dL — ABNORMAL HIGH (ref 70–99)
Total Protein: 7.9 g/dL (ref 6.0–8.3)

## 2012-01-18 LAB — CBC WITH DIFFERENTIAL/PLATELET
Basophils Absolute: 0 10*3/uL (ref 0.0–0.1)
Basophils Relative: 0 % (ref 0–1)
Eosinophils Absolute: 0.3 10*3/uL (ref 0.0–0.7)
HCT: 29.1 % — ABNORMAL LOW (ref 36.0–46.0)
Hemoglobin: 8.8 g/dL — ABNORMAL LOW (ref 12.0–15.0)
Lymphocytes Relative: 46 % (ref 12–46)
MCH: 19.5 pg — ABNORMAL LOW (ref 26.0–34.0)
MCHC: 30.2 g/dL (ref 30.0–36.0)
Monocytes Absolute: 0.4 10*3/uL (ref 0.1–1.0)
Neutro Abs: 2.9 10*3/uL (ref 1.7–7.7)
RDW: 17.1 % — ABNORMAL HIGH (ref 11.5–15.5)

## 2012-01-18 LAB — POCT PREGNANCY, URINE: Preg Test, Ur: NEGATIVE

## 2012-01-18 LAB — OCCULT BLOOD, POC DEVICE: Fecal Occult Bld: POSITIVE

## 2012-01-18 MED ORDER — SODIUM CHLORIDE 0.9 % IV SOLN
Freq: Once | INTRAVENOUS | Status: AC
  Administered 2012-01-18: 22:00:00 via INTRAVENOUS

## 2012-01-18 NOTE — ED Notes (Signed)
Pt coming from home with c/o rectal bleeding and abdominal pain. Pt reports abdominal pain started last night, rectal bleeding started today. Pt reports bright red blood in the toilet when she urinates and BM. Pt denies n/v/d. Pt has hx of hemorrhoids. Skin is warm and dry, denies shortness of breath, chest pain. Pt reports dizziness and blurry vision
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
## 2012-01-19 DIAGNOSIS — S43429A Sprain of unspecified rotator cuff capsule, initial encounter: Secondary | ICD-10-CM | POA: Diagnosis not present

## 2012-01-19 DIAGNOSIS — M2240 Chondromalacia patellae, unspecified knee: Secondary | ICD-10-CM | POA: Diagnosis not present

## 2012-01-19 MED ORDER — POLYETHYLENE GLYCOL 3350 17 GM/SCOOP PO POWD: 17.0000 g | Freq: Every day | ORAL | Status: AC

## 2012-01-19 MED ORDER — OXYCODONE-ACETAMINOPHEN 5-325 MG PO TABS
1.0000 | ORAL_TABLET | Freq: Once | ORAL | Status: AC
  Administered 2012-01-19: 1 via ORAL
  Filled 2012-01-19: qty 1

## 2012-01-19 NOTE — ED Provider Notes (Signed)
Medical screening examination/treatment/procedure(s) were performed by non-physician practitioner and as supervising physician I was immediately available for consultation/collaboration.   Lyanne Co, MD 01/19/12 [phone]

## 2012-01-20 ENCOUNTER — Ambulatory Visit: Payer: Self-pay | Admitting: Obstetrics and Gynecology

## 2012-01-24 DIAGNOSIS — K921 Melena: Secondary | ICD-10-CM | POA: Diagnosis not present

## 2012-01-24 DIAGNOSIS — R51 Headache: Secondary | ICD-10-CM | POA: Diagnosis not present

## 2012-01-26 ENCOUNTER — Encounter: Payer: Self-pay | Admitting: *Deleted

## 2012-01-26 NOTE — Progress Notes (Signed)
Patient ID: Brenda [name], female   DOB: 06-Feb-1971, 41 y.o.   MRN: [id number]  Pt has referral for dermatology. Pt has appointment with [facility] [phone]) on Friday, February 18, 2012 @ 11:30am. Pt has no phone number so I sent her a letter in the mail as a reminder of appointment. Tacey Heap RN

## 2012-01-27 ENCOUNTER — Encounter: Payer: Self-pay | Admitting: Obstetrics and Gynecology

## 2012-01-27 ENCOUNTER — Ambulatory Visit (INDEPENDENT_AMBULATORY_CARE_PROVIDER_SITE_OTHER): Payer: Medicare Other | Admitting: Obstetrics and Gynecology

## 2012-01-27 VITALS — BP 118/76 | HR 88 | Temp 98.3°F | Resp 16 | Ht 69.0 in | Wt 247.0 lb

## 2012-01-27 DIAGNOSIS — Z01419 Encounter for gynecological examination (general) (routine) without abnormal findings: Secondary | ICD-10-CM

## 2012-01-27 DIAGNOSIS — R87612 Low grade squamous intraepithelial lesion on cytologic smear of cervix (LGSIL): Secondary | ICD-10-CM | POA: Diagnosis not present

## 2012-01-27 DIAGNOSIS — Z124 Encounter for screening for malignant neoplasm of cervix: Secondary | ICD-10-CM

## 2012-01-27 NOTE — Progress Notes (Addendum)
Regular Periods: no Mammogram: yes  Monthly Breast Ex.: yes Exercise: no  Tetanus < 10 years: no Seatbelts: yes  NI. Bladder Functn.: yes Abuse at home: no  Daily BM's: yes Stressful Work: no  Healthy Diet: yes Sigmoid-Colonoscopy: 2012 'WNL"  Calcium: yes Medical problems this year: N/A   LAST PAP:05/22/2009   Contraception: NONE/  BTL  Mammogram:  2013 "WNL"  PCP: Viann Shove, MD   PMH: No Changes   FMH: No Changes  Last Bone Scan: Never  ANNUAL GYNECOLOGIC EXAMINATION   Brenda [name] is a 41 y.o. female, [id number], who presents for an annual exam. See above. The patient complains of continued dysmenorrhea and menorrhagia.  She had a benign endometrial biopsy in 2010.  She complains of left flank and left back pain.  She complains of increasing rectal bleeding.  She has an appointment for evaluation next week.  Prior Hysterectomy: No    History   Social History  . Marital Status: Single    Spouse Name: N/A    Number of Children: 4  . Years of Education: N/A   Occupational History  . [employer]    Social History Main Topics  . Smoking status: Never Smoker   . Smokeless tobacco: Never Used  . Alcohol Use: No  . Drug Use: No  . Sexually Active: Yes -- Female partner(s)    Birth Control/ Protection: Condom     refuses condoms/ BTL    Other Topics Concern  . None   Social History Narrative   Daily caffeine: 1 coffee/day4 children, 3 at home youngest born 2006Employed: [employer] TempEducation: High schoolSingle    Menstrual cycle:   LMP: Patient's last menstrual period was 01/06/2012.             The following portions of the patient's history were reviewed and updated as appropriate: allergies, current medications, past family history, past medical history, past social history, past surgical history and problem list.  Review of Systems Pertinent items are noted in HPI. Breast:Negative for breast lump,nipple discharge or nipple retraction Gastrointestinal:  Negative for abdominal pain, change in bowel habits or rectal bleeding Urinary:negative   Objective:    BP 118/76  Pulse 88  Temp 98.3 F (36.8 C) (Oral)  Resp 16  Ht 5\' 9"  (1.753 m)  Wt 247 lb (112.038 kg)  BMI 36.48 kg/m2  LMP 01/06/2012    Weight:  Wt Readings from Last 1 Encounters:  01/27/12 247 lb (112.038 kg)          BMI: Body mass index is 36.48 kg/(m^2).  General Appearance: Alert, appropriate appearance for age. No acute distress HEENT: Grossly normal Neck / Thyroid: Supple, no masses, nodes or enlargement Lungs: clear to auscultation bilaterally Back: No CVA tenderness Breast Exam: No masses or nodes.No dimpling, nipple retraction or discharge. Cardiovascular: Regular rate and rhythm. S1, S2, no murmur Gastrointestinal: Soft, non-tender, no masses or organomegaly  ++++++++++++++++++++++++++++++++++++++++++++++++++++++++  Pelvic Exam: External genitalia: normal general appearance Vaginal: normal without tenderness, induration or masses and relaxation: Yes Cervix: normal appearance Adnexa: normal bimanual exam Uterus: normal size, shape, and consistency Rectovaginal: normal rectal, no masses  ++++++++++++++++++++++++++++++++++++++++++++++++++++++++  Lymphatic Exam: Non-palpable nodes in neck, clavicular, axillary, or inguinal regions Neurologic: Normal speech, no tremor  Psychiatric: Alert and oriented, appropriate affect.   Wet Prep:   not applicable Urinalysis:  not applicable UPT:           Not done   Assessment:    Normal gyn exam  Overweight or obese: Yes   Pelvic relaxation: Yes  Fibroids  Menorrhagia  Dysmenorrhea  Back pain  Rectal bleeding   Plan:    pap smear Return to office in one month to prepare for possible surgery. Contraception:bilateral tubal ligation    Medications prescribed: none  STD screen request: No   The updated Pap smear screening guidelines were discussed with the patient. The patient requested that I  obtain a Pap smear: Yes.  Proper diet and regular exercise were reviewed.  Annual mammograms recommended starting at age 62. Proper breast care was discussed.  Screening colonoscopy is recommended beginning at age 23.  Patient will call her family physician today for immediate evaluation.  Brenda [name].D.

## 2012-02-01 LAB — PAP IG, CT-NG, RFX HPV ASCU

## 2012-02-04 ENCOUNTER — Other Ambulatory Visit: Payer: Self-pay

## 2012-02-04 ENCOUNTER — Telehealth: Payer: Self-pay

## 2012-02-04 NOTE — Telephone Encounter (Signed)
Per AVS pt needs colpo. Pt does not have an contact number on file. Pt Mother is her emergency contact. Pt mother was called to get an updated phone number. Pt mother stated if her daughter didn't leave a contact number Then she cannot give [place] her number. I asked the pt mother to let Ms.Califf know she needs to contact the office.  [facility] CMA

## 2012-02-09 ENCOUNTER — Telehealth: Payer: Self-pay | Admitting: Obstetrics and Gynecology

## 2012-02-09 NOTE — Telephone Encounter (Signed)
TRIAGE/RES °

## 2012-02-09 NOTE — Telephone Encounter (Signed)
Pt has no voicemail box set up. Pt was called again to discuss abnormal pap results. Per AVS pt need Colpo. I will try to reach pt again before the office closes.  [facility] CMA

## 2012-02-10 ENCOUNTER — Telehealth: Payer: Self-pay

## 2012-02-10 DIAGNOSIS — S92919A Unspecified fracture of unspecified toe(s), initial encounter for closed fracture: Secondary | ICD-10-CM | POA: Diagnosis not present

## 2012-02-10 NOTE — Telephone Encounter (Signed)
Colpo scheduled for 02-28-2012. Pt made aware of restrictions. Pt voiced understanding.   [facility] CMA

## 2012-02-15 DIAGNOSIS — R002 Palpitations: Secondary | ICD-10-CM | POA: Diagnosis not present

## 2012-02-15 DIAGNOSIS — R51 Headache: Secondary | ICD-10-CM | POA: Diagnosis not present

## 2012-02-16 ENCOUNTER — Ambulatory Visit (INDEPENDENT_AMBULATORY_CARE_PROVIDER_SITE_OTHER): Payer: Medicare Other | Admitting: Internal Medicine

## 2012-02-16 ENCOUNTER — Encounter: Payer: Self-pay | Admitting: Internal Medicine

## 2012-02-16 ENCOUNTER — Telehealth: Payer: Self-pay

## 2012-02-16 VITALS — BP 120/82 | HR 64 | Ht 69.0 in | Wt 245.0 lb

## 2012-02-16 DIAGNOSIS — D500 Iron deficiency anemia secondary to blood loss (chronic): Secondary | ICD-10-CM

## 2012-02-16 DIAGNOSIS — K648 Other hemorrhoids: Secondary | ICD-10-CM | POA: Diagnosis not present

## 2012-02-16 MED ORDER — HYDROCORTISONE 2.5 % RE CREA: TOPICAL_CREAM | Freq: Two times a day (BID) | RECTAL | Status: AC

## 2012-02-16 NOTE — Patient Instructions (Addendum)
You have been scheduled for an appointment with Dr. Romie Levee at [facility]. Your appointment is on __________ at ___________. Please arrive at _______ for registration. Make certain to bring a list of current medications, including any over the counter medications or vitamins. Also bring your co-pay if you have one as well as your insurance cards. [facility] is located at [address]. Should you need to reschedule your appointment, please contact them at [phone].  We have sent the following medications to your pharmacy for you to pick up at your convenience: Hemorrhoid cream  Today we have given you a hemorrhoid handout to read and follow.  Thank you for choosing me and [facility].  Iva Boop, M.D., [facility]

## 2012-02-16 NOTE — Telephone Encounter (Signed)
Spoke to Tesfa at Dr. Kenney Houseman Martin's office to get [facility]. For pt to see [facility] surgeon [facility].  It was approved and Tesfa faxed over a confirmation.  I have faxed that over to [place] at [facility] to aid her in setting up pts appointment.  Approved from 02/16/2012-06/06/2012 for four visits.  I will scan confirmation form into epic.

## 2012-02-16 NOTE — Progress Notes (Signed)
Subjective:    Patient ID: Brenda [name], female    DOB: 03-11-1971, 41 y.o.   MRN: [id number]  HPI The patient is a pleasant African American woman, she is known from prior colonoscopy showing diverticulosis and hemorrhoids. She's had intermittent rectal bleeding and hematochezia, she says the blood is somewhat dark. This is the way she has bled off and on for years from known hemorrhoids. I had performed colonoscopy last year and recommended surgical evaluation for persistent bleeding. She is a chronically low hemoglobin, apparently mostly related to menorrhagia, and is planning for a partial hysterectomy through Dr. Stefano Gaul most likely, to correct that.  3 weeks ago she had a period of bleeding heavier than in the past. She denies significant straining to stool or diarrhea. She had normally been moving her bowels about 3 times a day and went to once a day however. She feels bloated and gassy at times. She increase his fiber but that makes her feel worse. She also believes she is lactose intolerant as milk we'll cause a lot of gas. There is occasional left low back pain at times. Last rectal bleeding or hematochezia about 2 weeks ago.  Allergies  Allergen Reactions  . Cortisone     REACTION: "nerves"  . Hydrocodone-Acetaminophen     REACTION: "nerves"  . Morphine     REACTION: "nerves"   Outpatient Prescriptions Prior to Visit  Medication Sig Dispense Refill  . aspirin 81 MG chewable tablet Chew 81 mg by mouth daily.      . Chlorhexidine Gluconate 2 % LIQD Apply 5 mLs topically once a week. Apply to cloth and wipe down all skin surfaces except head and genitals, once a week.  50 mL  3  . emtricitabine-tenofovir (TRUVADA) 200-300 MG per tablet Take 1 tablet by mouth daily.  30 tablet  6  . insulin detemir (LEVEMIR) 100 UNIT/ML injection Inject 60 Units into the skin daily. 30 units every morning and 30 units at bedtime      . lopinavir-ritonavir (KALETRA) 200-50 MG per tablet Take 2  tablets by mouth 2 (two) times daily.  120 tablet  6  . Multiple Vitamin (MULTIVITAMIN WITH MINERALS) TABS Take 1 tablet by mouth daily.      Marland Kitchen oxyCODONE-acetaminophen (PERCOCET) 5-325 MG per tablet Take 1 tablet by mouth every 6 (six) hours as needed. Pain      . sulfamethoxazole-trimethoprim (BACTRIM DS) 800-160 MG per tablet Take 1 tablet by mouth 2 (two) times daily.      Marland Kitchen zolpidem (AMBIEN) 10 MG tablet Take 10 mg by mouth at bedtime as needed. Insomnia      . mupirocin nasal ointment (BACTROBAN) 2 % Place into the nose 2 (two) times daily. Use one-half of tube in each nostril twice daily for five (5) days. After application, press sides of nose together and gently massage. Do first 5 days of each month for next 6 months.  10 g  0   Facility-Administered Medications Prior to Visit  Medication Dose Route Frequency Provider Last Rate Last Dose  . 0.9 %  sodium chloride infusion  500 mL Intravenous Continuous Iva Boop, MD       Past Medical History  Diagnosis Date  . HIV disease   . Diabetes mellitus   . Hemorrhoids   . 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 Chronic folliculitis   . Iron deficiency anemia due to chronic blood loss   . Obesity   . Pneumothorax, traumatic   .  Arthritis     knees  . Irregular menstrual cycle    Past Surgical History  Procedure Date  . Hysteroscopy w/d&c Removal of Endometrial Polyp    Dr. Stefano Gaul 2011  . Chest tube insertion   . Tubal ligation    History   Social History  . Marital Status: Single    Spouse Name: N/A    Number of Children: 4  . Years of Education: N/A   Occupational History  . [employer]    Social History Main Topics  . Smoking status: Never Smoker   . Smokeless tobacco: Never Used  . Alcohol Use: No  . Drug Use: No  . Sexually Active: Yes -- Female partner(s)    Birth Control/ Protection: Condom     refuses condoms/ BTL    Other Topics Concern  . None   Social History Narrative   Daily caffeine: 1  coffee/day4 children, 3 at home youngest born 2006Employed: [employer] TempEducation: High schoolSingle   Family History  Problem Relation Age of Onset  . Diabetes Mother   . Heart failure Father   . Diabetes Father   . Heart failure Brother   . Colon cancer      neg hx.       Review of Systems As per history of present illness    Objective:   Physical Exam Obese well-developed young black woman in no acute distress Abdomen is soft and nontender without organomegaly or mass Rectal exam and the presence of female staff demonstrates no abnormalities, she has no ask, there is brown stool present. Anoscopy is performed confirming and large swollen and inflamed hemorrhoids, 3 columns in the anal canal.       Assessment & Plan:   1. Hemorrhoids, internal, with bleeding   2. Chronic blood loss anemia    1. Hydrocortisone cream as prescribed for her hemorrhoids 2. Surgical referral will be made, to consider treatment of hemorrhoids. I don't note that she needs surgery but perhaps an office procedure such as banding or sclerosis will help of the bleeding 3. MiraLax daily to promote more regular bowel movements, she can then reduce fiber and then hopefully be less bloated and gassy. 4. I do believe her bleeding is hemorrhoidal. She reports somewhat darker blood and not bright red blood, but given the chronicity of the findings on colonoscopy last year a believe that is the case, if she were to have persistent bleeding despite correction of hemorrhoids then would consider repeat lower endoscopy.  CC: [place] , MD Romie Levee, MD and Kirkland Hun, MD

## 2012-02-25 DIAGNOSIS — R51 Headache: Secondary | ICD-10-CM | POA: Diagnosis not present

## 2012-02-28 ENCOUNTER — Encounter: Payer: Medicare Other | Admitting: Obstetrics and Gynecology

## 2012-02-29 ENCOUNTER — Ambulatory Visit (INDEPENDENT_AMBULATORY_CARE_PROVIDER_SITE_OTHER): Payer: Medicare Other | Admitting: General Surgery

## 2012-03-03 ENCOUNTER — Telehealth: Payer: Self-pay

## 2012-03-03 NOTE — Telephone Encounter (Signed)
Pt was called today to R/S Colpo. Pt has Medicaid INS and Needed [place] referral. I consulted w/ [employer] production planner. Appt was schduled for 03/14/12 @ 11:30am pt made aware. Pt was approved for 4 office visit from [place] Referral. Pt will call to R/S if she starts her cycle.  [facility] CMA

## 2012-03-06 ENCOUNTER — Encounter (INDEPENDENT_AMBULATORY_CARE_PROVIDER_SITE_OTHER): Payer: Self-pay | Admitting: General Surgery

## 2012-03-14 ENCOUNTER — Ambulatory Visit (INDEPENDENT_AMBULATORY_CARE_PROVIDER_SITE_OTHER): Payer: Medicare Other | Admitting: Obstetrics and Gynecology

## 2012-03-14 ENCOUNTER — Encounter: Payer: Self-pay | Admitting: Obstetrics and Gynecology

## 2012-03-14 VITALS — BP 138/84 | Wt 245.0 lb

## 2012-03-14 DIAGNOSIS — R6889 Other general symptoms and signs: Secondary | ICD-10-CM | POA: Diagnosis not present

## 2012-03-14 DIAGNOSIS — N888 Other specified noninflammatory disorders of cervix uteri: Secondary | ICD-10-CM | POA: Diagnosis not present

## 2012-03-14 DIAGNOSIS — D219 Benign neoplasm of connective and other soft tissue, unspecified: Secondary | ICD-10-CM

## 2012-03-14 DIAGNOSIS — N870 Mild cervical dysplasia: Secondary | ICD-10-CM | POA: Diagnosis not present

## 2012-03-14 DIAGNOSIS — D259 Leiomyoma of uterus, unspecified: Secondary | ICD-10-CM | POA: Diagnosis not present

## 2012-03-14 DIAGNOSIS — IMO0002 Reserved for concepts with insufficient information to code with codable children: Secondary | ICD-10-CM

## 2012-03-14 DIAGNOSIS — N946 Dysmenorrhea, unspecified: Secondary | ICD-10-CM

## 2012-03-14 DIAGNOSIS — R87612 Low grade squamous intraepithelial lesion on cytologic smear of cervix (LGSIL): Secondary | ICD-10-CM

## 2012-03-14 NOTE — Addendum Note (Signed)
Addended by: Tim Lair on: 03/14/2012 12:34 PM   Modules accepted: Orders

## 2012-03-14 NOTE — Progress Notes (Signed)
HISTORY OF PRESENT ILLNESS  [name] is a 41 y.o. year old female,[id number], who presents for a problem visit. 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  She has a known history of fibroids and dysmenorrhea.  Subjective:  Continued pain with her periods.  Objective:  BP 138/84  Wt 245 lb (111.131 kg)  LMP 03/07/2012   GI: soft and nontender  External genitalia: normal general appearance Vaginal: normal without tenderness, induration or masses Cervix: see colposcopy note Adnexa: normal bimanual exam Uterus: upper limits normal size  COLPOSCOPY NOTE:  The colposcopy procedure was explained.  The patient's questions were answered. A speculum exam was performed.  The cervix was prepped with acetic acid and Hurricaine gel.  The cervix was evaluated using a white light and the green filter. Findings: white epithelium at the 12 o'clock position .  The endocervical canal was clear.  No lesions were seen.  Biopsies obtained: at 12:00.  Hemostasis was adequate.  An endocervical curettage was performed.  Again, hemostasis was adequate. The procedure was terminated.  The patient tolerated her procedure well.  The specimens were sent to pathology.   Assessment:  CIN-1  Fibroids  Dysmenorrhea  Plan:  Biopsy of the cervix from 5:00 and endocervical curettage to pathology.  Patient was to consider surgery in January or February of 2014.  Return to office in 2 week(s).   Leonard Schwartz M.D.  03/14/2012 12:16 PM    Previous Pap Smear: 01/27/12 LSIL HPV/MILD DYSPLASIA/CIN1 Previous Colposcopy: n/a Referred From: n/a LMP: 03/07/12 Contraception: BTL G,P: 8, 4 Offered water, pt unable to give urine sample.

## 2012-03-16 DIAGNOSIS — H5210 Myopia, unspecified eye: Secondary | ICD-10-CM | POA: Diagnosis not present

## 2012-03-16 DIAGNOSIS — E119 Type 2 diabetes mellitus without complications: Secondary | ICD-10-CM | POA: Diagnosis not present

## 2012-03-16 DIAGNOSIS — H538 Other visual disturbances: Secondary | ICD-10-CM | POA: Diagnosis not present

## 2012-03-16 LAB — PATHOLOGY

## 2012-03-27 DIAGNOSIS — G4700 Insomnia, unspecified: Secondary | ICD-10-CM | POA: Diagnosis not present

## 2012-03-27 DIAGNOSIS — R51 Headache: Secondary | ICD-10-CM | POA: Diagnosis not present

## 2012-04-27 DIAGNOSIS — G4700 Insomnia, unspecified: Secondary | ICD-10-CM | POA: Diagnosis not present

## 2012-04-27 DIAGNOSIS — R51 Headache: Secondary | ICD-10-CM | POA: Diagnosis not present

## 2012-05-26 DIAGNOSIS — M545 Low back pain: Secondary | ICD-10-CM | POA: Diagnosis not present

## 2012-05-26 DIAGNOSIS — R51 Headache: Secondary | ICD-10-CM | POA: Diagnosis not present

## 2012-06-26 DIAGNOSIS — R51 Headache: Secondary | ICD-10-CM | POA: Diagnosis not present

## 2012-06-26 DIAGNOSIS — IMO0001 Reserved for inherently not codable concepts without codable children: Secondary | ICD-10-CM | POA: Diagnosis not present

## 2012-06-26 DIAGNOSIS — R110 Nausea: Secondary | ICD-10-CM | POA: Diagnosis not present

## 2012-06-26 DIAGNOSIS — G4700 Insomnia, unspecified: Secondary | ICD-10-CM | POA: Diagnosis not present

## 2012-07-06 DIAGNOSIS — H109 Unspecified conjunctivitis: Secondary | ICD-10-CM | POA: Diagnosis not present

## 2012-07-25 DIAGNOSIS — G4700 Insomnia, unspecified: Secondary | ICD-10-CM | POA: Diagnosis not present

## 2012-07-25 DIAGNOSIS — R51 Headache: Secondary | ICD-10-CM | POA: Diagnosis not present

## 2012-07-25 DIAGNOSIS — J019 Acute sinusitis, unspecified: Secondary | ICD-10-CM | POA: Diagnosis not present

## 2012-08-18 ENCOUNTER — Other Ambulatory Visit: Payer: Self-pay | Admitting: Infectious Diseases

## 2012-08-21 ENCOUNTER — Other Ambulatory Visit: Payer: Self-pay | Admitting: Infectious Diseases

## 2012-08-28 DIAGNOSIS — M545 Low back pain: Secondary | ICD-10-CM | POA: Diagnosis not present

## 2012-08-28 DIAGNOSIS — R51 Headache: Secondary | ICD-10-CM | POA: Diagnosis not present

## 2012-08-28 DIAGNOSIS — E119 Type 2 diabetes mellitus without complications: Secondary | ICD-10-CM | POA: Diagnosis not present

## 2012-09-04 ENCOUNTER — Encounter (HOSPITAL_COMMUNITY): Payer: Self-pay | Admitting: *Deleted

## 2012-09-04 ENCOUNTER — Emergency Department (HOSPITAL_COMMUNITY)
Admission: EM | Admit: 2012-09-04 | Discharge: 2012-09-04 | Disposition: A | Discharge status: Home / Self Care | Payer: Medicare Other | Attending: Emergency Medicine | Admitting: Emergency Medicine

## 2012-09-04 DIAGNOSIS — Z872 Personal history of diseases of the skin and subcutaneous tissue: Secondary | ICD-10-CM | POA: Insufficient documentation

## 2012-09-04 DIAGNOSIS — E669 Obesity, unspecified: Secondary | ICD-10-CM | POA: Insufficient documentation

## 2012-09-04 DIAGNOSIS — Z87828 Personal history of other (healed) physical injury and trauma: Secondary | ICD-10-CM | POA: Insufficient documentation

## 2012-09-04 DIAGNOSIS — Z8742 Personal history of other diseases of the female genital tract: Secondary | ICD-10-CM | POA: Diagnosis not present

## 2012-09-04 DIAGNOSIS — H53149 Visual discomfort, unspecified: Secondary | ICD-10-CM | POA: Insufficient documentation

## 2012-09-04 DIAGNOSIS — Z862 Personal history of diseases of the blood and blood-forming organs and certain disorders involving the immune mechanism: Secondary | ICD-10-CM | POA: Diagnosis not present

## 2012-09-04 DIAGNOSIS — Z794 Long term (current) use of insulin: Secondary | ICD-10-CM | POA: Insufficient documentation

## 2012-09-04 DIAGNOSIS — Z21 Asymptomatic human immunodeficiency virus [HIV] infection status: Secondary | ICD-10-CM | POA: Insufficient documentation

## 2012-09-04 DIAGNOSIS — Z79899 Other long term (current) drug therapy: Secondary | ICD-10-CM | POA: Insufficient documentation

## 2012-09-04 DIAGNOSIS — R110 Nausea: Secondary | ICD-10-CM | POA: Diagnosis not present

## 2012-09-04 DIAGNOSIS — Z8661 Personal history of infections of the central nervous system: Secondary | ICD-10-CM | POA: Diagnosis not present

## 2012-09-04 DIAGNOSIS — M542 Cervicalgia: Secondary | ICD-10-CM | POA: Diagnosis not present

## 2012-09-04 DIAGNOSIS — Z7982 Long term (current) use of aspirin: Secondary | ICD-10-CM | POA: Diagnosis not present

## 2012-09-04 DIAGNOSIS — G43909 Migraine, unspecified, not intractable, without status migrainosus: Secondary | ICD-10-CM | POA: Diagnosis not present

## 2012-09-04 DIAGNOSIS — E119 Type 2 diabetes mellitus without complications: Secondary | ICD-10-CM | POA: Diagnosis not present

## 2012-09-04 DIAGNOSIS — Z8679 Personal history of other diseases of the circulatory system: Secondary | ICD-10-CM | POA: Diagnosis not present

## 2012-09-04 DIAGNOSIS — Z8739 Personal history of other diseases of the musculoskeletal system and connective tissue: Secondary | ICD-10-CM | POA: Insufficient documentation

## 2012-09-04 HISTORY — DX: Migraine, unspecified, not intractable, without status migrainosus: G43.909

## 2012-09-04 MED ORDER — SODIUM CHLORIDE 0.9 % IV SOLN
1000.0000 mL | Freq: Once | INTRAVENOUS | Status: AC
  Administered 2012-09-04: 1000 mL via INTRAVENOUS

## 2012-09-04 MED ORDER — METOCLOPRAMIDE HCL 5 MG/ML IJ SOLN
10.0000 mg | Freq: Once | INTRAMUSCULAR | Status: AC
  Administered 2012-09-04: 10 mg via INTRAVENOUS
  Filled 2012-09-04: qty 2

## 2012-09-04 MED ORDER — METOCLOPRAMIDE HCL 10 MG PO TABS: 10.0000 mg | ORAL_TABLET | Freq: Four times a day (QID) | ORAL | Status: DC | PRN

## 2012-09-04 MED ORDER — DIPHENHYDRAMINE HCL 50 MG/ML IJ SOLN
25.0000 mg | Freq: Once | INTRAMUSCULAR | Status: AC
  Administered 2012-09-04: 50 mg via INTRAVENOUS
  Filled 2012-09-04: qty 1

## 2012-09-04 MED ORDER — SODIUM CHLORIDE 0.9 % IV SOLN: 1000.0000 mL | INTRAVENOUS | Status: DC

## 2012-09-04 NOTE — ED Notes (Signed)
Pt states she has been having a migraine for 2 days.  C/o frontal sinus pain, parietal pain and neck pain.  Also c/o photophobia, nausea (pt eating chips when called to triage).

## 2012-09-04 NOTE — ED Provider Notes (Signed)
History  This chart was scribed for Brenda Booze, MD by Shari Heritage, ED Scribe. The patient was seen in room TR10C/TR10C. Patient's care was started at 1740.   CSN: [id number]  Arrival date & time 09/04/12  1712   First MD Initiated Contact with Patient 09/04/12 1740      Chief Complaint  Patient presents with  . Migraine     The history is provided by the patient. No language interpreter was used.    HPI Comments: BURDETTE GERGELY is a 42 y.o. female with history of migraines, diabetes, HIV who presents to the Emergency Department complaining of throbbing, moderate to severe, constant, generalized headache onset 2 days ago. Patient rates pain as 8/10. There is associated nausea, neck pain and photophobia. She denies fever, chills, diaphoresis, vomiting, blurred vision or double vision. She states that she doesn't get migraine headaches very often. Patient hasn't taken any medicines for pain relief. She does not smoke or use alcohol.   Past Medical History  Diagnosis Date  . HIV disease   . Diabetes mellitus   . Hemorrhoids   . 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 Chronic folliculitis   . Iron deficiency anemia due to chronic blood loss   . Obesity   . Pneumothorax, traumatic   . Arthritis     knees  . Irregular menstrual cycle   . Migraines     Past Surgical History  Procedure Laterality Date  . Hysteroscopy w/d&c  Removal of Endometrial Polyp    Dr. Stefano Gaul 2011  . Chest tube insertion    . Tubal ligation      Family History  Problem Relation Age of Onset  . Diabetes Mother   . Heart failure Father   . Diabetes Father   . Heart failure Brother   . Colon cancer      neg hx.    History  Substance Use Topics  . Smoking status: Never Smoker   . Smokeless tobacco: Never Used  . Alcohol Use: No    OB History   Grav Para Term Preterm Abortions TAB SAB Ect Mult Living   8 4 4  4  4   4       Review of Systems  Constitutional: Negative for fever and chills.   HENT: Positive for neck pain.   Eyes: Positive for photophobia. Negative for visual disturbance.  Gastrointestinal: Positive for nausea. Negative for vomiting.  Neurological: Positive for headaches.  All other systems reviewed and are negative.    Allergies  Cortisone; Hydrocodone-acetaminophen; and Morphine  Home Medications   Current Outpatient Rx  Name  Route  Sig  Dispense  Refill  . aspirin 81 MG chewable tablet   Oral   Chew 81 mg by mouth daily.         . Chlorhexidine Gluconate 2 % LIQD   Apply externally   Apply 5 mLs topically once a week. Apply to cloth and wipe down all skin surfaces except head and genitals, once a week.   50 mL   3   . emtricitabine-tenofovir (TRUVADA) 200-300 MG per tablet   Oral   Take 1 tablet by mouth daily.   30 tablet   6   . insulin detemir (LEVEMIR) 100 UNIT/ML injection   Subcutaneous   Inject 60 Units into the skin daily. 30 units every morning and 30 units at bedtime         . lopinavir-ritonavir (KALETRA) 200-50 MG per tablet   Oral  Take 2 tablets by mouth 2 (two) times daily.   120 tablet   6   . Multiple Vitamin (MULTIVITAMIN WITH MINERALS) TABS   Oral   Take 1 tablet by mouth daily.         Marland Kitchen oxyCODONE-acetaminophen (PERCOCET) 5-325 MG per tablet   Oral   Take 1 tablet by mouth every 6 (six) hours as needed. Pain         . zolpidem (AMBIEN) 10 MG tablet   Oral   Take 10 mg by mouth at bedtime as needed. Insomnia           Triage Vitals: BP 132/86  Pulse 93  Temp(Src) 98.6 F (37 C) (Oral)  Resp 16  SpO2 96%  LMP 06/03/2012  Physical Exam  Constitutional: She is oriented to person, place, and time. She appears well-developed and well-nourished.  HENT:  Head: Normocephalic and atraumatic.  Eyes: Conjunctivae and EOM are normal. Pupils are equal, round, and reactive to light.  Fundi are normal.  Neck: Normal range of motion. Neck supple.  Cardiovascular: Normal rate, regular rhythm and  normal heart sounds.   Pulmonary/Chest: Effort normal and breath sounds normal.  Musculoskeletal: Normal range of motion.  Neurological: She is alert and oriented to person, place, and time.  Skin: Skin is warm and dry.    ED Course  Procedures (including critical care time) DIAGNOSTIC STUDIES: Oxygen Saturation is 96% on room air, adequate by my interpretation.    COORDINATION OF CARE: 5:42 PM- Patient informed of current plan for treatment and evaluation and agrees with plan at this time.   6:55 PM- Patient feels better after IV fluids, Reglan and Benadryl. Will discharge home with Reglan 10 mg to take every 6 hours.    1. Migraine headache       MDM  Which probably is a migraine headache. She'll be given a headache cocktail and reassessed. Records are reviewed and I do not see any recent ED visits for migraines.  Assessment better after IV fluid bolus, IV metoclopramide, and IV diphenhydramine. She is sent with a prescription for metoclopramide.   I personally performed the services described in this documentation, which was scribed in my presence. The recorded information has been reviewed and is accurate.      Brenda Booze, MD 09/04/12 [phone]

## 2012-09-04 NOTE — ED Notes (Signed)
Patient is alert and orientedx4.  Patient was explained discharge instructions and they understood them with no questions.   

## 2012-09-18 DIAGNOSIS — R110 Nausea: Secondary | ICD-10-CM | POA: Diagnosis not present

## 2012-09-28 DIAGNOSIS — E119 Type 2 diabetes mellitus without complications: Secondary | ICD-10-CM | POA: Diagnosis not present

## 2012-09-28 DIAGNOSIS — R51 Headache: Secondary | ICD-10-CM | POA: Diagnosis not present

## 2012-09-28 DIAGNOSIS — J309 Allergic rhinitis, unspecified: Secondary | ICD-10-CM | POA: Diagnosis not present

## 2012-09-28 DIAGNOSIS — R110 Nausea: Secondary | ICD-10-CM | POA: Diagnosis not present

## 2012-10-23 DIAGNOSIS — M2240 Chondromalacia patellae, unspecified knee: Secondary | ICD-10-CM | POA: Diagnosis not present

## 2012-10-25 DIAGNOSIS — M25569 Pain in unspecified knee: Secondary | ICD-10-CM | POA: Diagnosis not present

## 2012-10-27 DIAGNOSIS — G4700 Insomnia, unspecified: Secondary | ICD-10-CM | POA: Diagnosis not present

## 2012-10-27 DIAGNOSIS — E119 Type 2 diabetes mellitus without complications: Secondary | ICD-10-CM | POA: Diagnosis not present

## 2012-10-27 DIAGNOSIS — M545 Low back pain: Secondary | ICD-10-CM | POA: Diagnosis not present

## 2012-11-20 ENCOUNTER — Other Ambulatory Visit: Payer: Medicare Other

## 2012-11-20 DIAGNOSIS — Z79899 Other long term (current) drug therapy: Secondary | ICD-10-CM | POA: Diagnosis not present

## 2012-11-20 DIAGNOSIS — B20 Human immunodeficiency virus [HIV] disease: Secondary | ICD-10-CM | POA: Diagnosis not present

## 2012-11-20 DIAGNOSIS — Z113 Encounter for screening for infections with a predominantly sexual mode of transmission: Secondary | ICD-10-CM | POA: Diagnosis not present

## 2012-11-20 LAB — COMPREHENSIVE METABOLIC PANEL
ALT: 9 U/L (ref 0–35)
Alkaline Phosphatase: 95 U/L (ref 39–117)
CO2: 26 mEq/L (ref 19–32)
Potassium: 4.5 mEq/L (ref 3.5–5.3)
Sodium: 139 mEq/L (ref 135–145)
Total Bilirubin: 0.2 mg/dL — ABNORMAL LOW (ref 0.3–1.2)
Total Protein: 7 g/dL (ref 6.0–8.3)

## 2012-11-20 LAB — LIPID PANEL
LDL Cholesterol: 128 mg/dL — ABNORMAL HIGH (ref 0–99)
VLDL: 37 mg/dL (ref 0–40)

## 2012-11-20 LAB — CBC
MCH: 20.7 pg — ABNORMAL LOW (ref 26.0–34.0)
MCHC: 31.2 g/dL (ref 30.0–36.0)
Platelets: 423 10*3/uL — ABNORMAL HIGH (ref 150–400)
RBC: 4.3 MIL/uL (ref 3.87–5.11)

## 2012-11-21 LAB — T-HELPER CELL (CD4) - (RCID CLINIC ONLY)
CD4 % Helper T Cell: 30 % — ABNORMAL LOW (ref 33–55)
CD4 T Cell Abs: 450 uL (ref 400–2700)

## 2012-11-21 LAB — HIV-1 RNA QUANT-NO REFLEX-BLD
HIV 1 RNA Quant: 95 copies/mL — ABNORMAL HIGH (ref <20)
HIV-1 RNA Quant, Log: 1.98 {Log} — ABNORMAL HIGH (ref <1.30)

## 2012-11-28 DIAGNOSIS — R110 Nausea: Secondary | ICD-10-CM | POA: Diagnosis not present

## 2012-12-18 ENCOUNTER — Ambulatory Visit (INDEPENDENT_AMBULATORY_CARE_PROVIDER_SITE_OTHER): Payer: Medicare Other | Admitting: Infectious Diseases

## 2012-12-18 ENCOUNTER — Encounter: Payer: Self-pay | Admitting: Infectious Diseases

## 2012-12-18 VITALS — BP 136/84 | HR 85 | Temp 97.9°F | Ht 69.0 in | Wt 232.0 lb

## 2012-12-18 DIAGNOSIS — Z113 Encounter for screening for infections with a predominantly sexual mode of transmission: Secondary | ICD-10-CM

## 2012-12-18 DIAGNOSIS — Z23 Encounter for immunization: Secondary | ICD-10-CM

## 2012-12-18 DIAGNOSIS — B20 Human immunodeficiency virus [HIV] disease: Secondary | ICD-10-CM

## 2012-12-18 DIAGNOSIS — R21 Rash and other nonspecific skin eruption: Secondary | ICD-10-CM | POA: Diagnosis not present

## 2012-12-18 DIAGNOSIS — IMO0002 Reserved for concepts with insufficient information to code with codable children: Secondary | ICD-10-CM

## 2012-12-18 DIAGNOSIS — E118 Type 2 diabetes mellitus with unspecified complications: Secondary | ICD-10-CM | POA: Diagnosis not present

## 2012-12-18 NOTE — Assessment & Plan Note (Signed)
She is doing well. She is offered to change to a newer regimen, FDC, which she refuses. I let her know that it could improver her DM. Will see her back in 6 months with labs.

## 2012-12-18 NOTE — Progress Notes (Signed)
  Subjective:    Patient ID: Brenda [name], female    DOB: [birth year], 42 y.o.   MRN: [id number]  HPI 42 yo F with hx of HIV+ (1998) and DM2 (gestational DM @ 42 yo). Had cone Bx recently and was told it was "abnormal but nothing to worry about".  Taking KLT/TRV. No other rx.  Is taking insulin for her DM now, sees Dr Daphine Deutscher.   HIV 1 RNA Quant (copies/mL)  Date Value  11/20/2012 95*  11/22/2011 149*  06/15/2011 <20      CD4 T Cell Abs (cmm)  Date Value  11/20/2012 450   11/22/2011 600   06/15/2011 650       Review of Systems  Constitutional: Negative for appetite change and unexpected weight change.  Gastrointestinal: Negative for diarrhea and constipation.  Genitourinary: Positive for menstrual problem. Negative for difficulty urinating.  Neurological: Positive for numbness.  menses are irregular since having tubes tied.      Objective:   Physical Exam  Constitutional: She appears well-developed and well-nourished.  HENT:  Mouth/Throat: No oropharyngeal exudate.  Eyes: EOM are normal. Pupils are equal, round, and reactive to light.  Neck: Neck supple.  Cardiovascular: Normal rate, regular rhythm and normal heart sounds.   Pulmonary/Chest: Effort normal and breath sounds normal.  Abdominal: Soft. Bowel sounds are normal. There is no tenderness.  Musculoskeletal: She exhibits no tenderness.  Lymphadenopathy:    She has no cervical adenopathy.  Neurological:  Mild decrease in light touch R foot  Skin:             Assessment & Plan:

## 2012-12-18 NOTE — Addendum Note (Signed)
Addended by: Andree Coss on: 12/18/2012 11:31 AM   Modules accepted: Orders

## 2012-12-18 NOTE — Assessment & Plan Note (Signed)
She'll f/u with her PCP

## 2012-12-18 NOTE — Assessment & Plan Note (Signed)
I suspect that this is related to her DM. I would send her Vaughan Sine (has seen [facility] previously) but she has to be refered by her PCP.

## 2012-12-26 DIAGNOSIS — R51 Headache: Secondary | ICD-10-CM | POA: Diagnosis not present

## 2013-01-10 ENCOUNTER — Other Ambulatory Visit: Payer: Self-pay | Admitting: Infectious Diseases

## 2013-01-26 DIAGNOSIS — N920 Excessive and frequent menstruation with regular cycle: Secondary | ICD-10-CM | POA: Diagnosis not present

## 2013-01-26 DIAGNOSIS — M545 Low back pain: Secondary | ICD-10-CM | POA: Diagnosis not present

## 2013-02-23 DIAGNOSIS — M545 Low back pain: Secondary | ICD-10-CM | POA: Diagnosis not present

## 2013-02-23 DIAGNOSIS — F411 Generalized anxiety disorder: Secondary | ICD-10-CM | POA: Diagnosis not present

## 2013-03-30 DIAGNOSIS — R21 Rash and other nonspecific skin eruption: Secondary | ICD-10-CM | POA: Diagnosis not present

## 2013-03-30 DIAGNOSIS — R51 Headache: Secondary | ICD-10-CM | POA: Diagnosis not present

## 2013-03-30 DIAGNOSIS — R110 Nausea: Secondary | ICD-10-CM | POA: Diagnosis not present

## 2013-03-30 DIAGNOSIS — E119 Type 2 diabetes mellitus without complications: Secondary | ICD-10-CM | POA: Diagnosis not present

## 2013-04-12 ENCOUNTER — Other Ambulatory Visit: Payer: Self-pay

## 2013-04-27 ENCOUNTER — Other Ambulatory Visit: Payer: Self-pay | Admitting: Infectious Diseases

## 2013-04-27 DIAGNOSIS — R51 Headache: Secondary | ICD-10-CM | POA: Diagnosis not present

## 2013-04-27 DIAGNOSIS — R110 Nausea: Secondary | ICD-10-CM | POA: Diagnosis not present

## 2013-04-27 DIAGNOSIS — G4700 Insomnia, unspecified: Secondary | ICD-10-CM | POA: Diagnosis not present

## 2013-04-30 ENCOUNTER — Other Ambulatory Visit (HOSPITAL_COMMUNITY): Payer: Self-pay | Admitting: Family Medicine

## 2013-04-30 DIAGNOSIS — Z1231 Encounter for screening mammogram for malignant neoplasm of breast: Secondary | ICD-10-CM

## 2013-05-15 DIAGNOSIS — J069 Acute upper respiratory infection, unspecified: Secondary | ICD-10-CM | POA: Diagnosis not present

## 2013-05-17 ENCOUNTER — Ambulatory Visit (HOSPITAL_COMMUNITY)
Admission: RE | Admit: 2013-05-17 | Discharge: 2013-05-17 | Disposition: A | Discharge status: Home / Self Care | Payer: Medicare Other | Source: Ambulatory Visit | Attending: Family Medicine | Admitting: Family Medicine

## 2013-05-17 DIAGNOSIS — Z1231 Encounter for screening mammogram for malignant neoplasm of breast: Secondary | ICD-10-CM | POA: Insufficient documentation

## 2013-05-28 DIAGNOSIS — M545 Low back pain: Secondary | ICD-10-CM | POA: Diagnosis not present

## 2013-06-07 DIAGNOSIS — Z9289 Personal history of other medical treatment: Secondary | ICD-10-CM

## 2013-06-07 HISTORY — DX: Personal history of other medical treatment: Z92.89

## 2013-06-11 ENCOUNTER — Emergency Department (HOSPITAL_COMMUNITY)
Admission: EM | Admit: 2013-06-11 | Discharge: 2013-06-11 | Disposition: A | Discharge status: Home / Self Care | Payer: Medicare Other | Attending: Emergency Medicine | Admitting: Emergency Medicine

## 2013-06-11 ENCOUNTER — Encounter (HOSPITAL_COMMUNITY): Payer: Self-pay | Admitting: Emergency Medicine

## 2013-06-11 ENCOUNTER — Emergency Department (HOSPITAL_COMMUNITY): Payer: Medicare Other

## 2013-06-11 DIAGNOSIS — Z7982 Long term (current) use of aspirin: Secondary | ICD-10-CM | POA: Diagnosis not present

## 2013-06-11 DIAGNOSIS — J189 Pneumonia, unspecified organism: Secondary | ICD-10-CM | POA: Diagnosis not present

## 2013-06-11 DIAGNOSIS — Z862 Personal history of diseases of the blood and blood-forming organs and certain disorders involving the immune mechanism: Secondary | ICD-10-CM | POA: Diagnosis not present

## 2013-06-11 DIAGNOSIS — Z8679 Personal history of other diseases of the circulatory system: Secondary | ICD-10-CM | POA: Insufficient documentation

## 2013-06-11 DIAGNOSIS — L03019 Cellulitis of unspecified finger: Secondary | ICD-10-CM | POA: Diagnosis not present

## 2013-06-11 DIAGNOSIS — IMO0002 Reserved for concepts with insufficient information to code with codable children: Secondary | ICD-10-CM | POA: Diagnosis not present

## 2013-06-11 DIAGNOSIS — J029 Acute pharyngitis, unspecified: Secondary | ICD-10-CM | POA: Diagnosis not present

## 2013-06-11 DIAGNOSIS — E669 Obesity, unspecified: Secondary | ICD-10-CM | POA: Diagnosis not present

## 2013-06-11 DIAGNOSIS — M25569 Pain in unspecified knee: Secondary | ICD-10-CM | POA: Diagnosis not present

## 2013-06-11 DIAGNOSIS — Z8669 Personal history of other diseases of the nervous system and sense organs: Secondary | ICD-10-CM | POA: Diagnosis not present

## 2013-06-11 DIAGNOSIS — Z79899 Other long term (current) drug therapy: Secondary | ICD-10-CM | POA: Diagnosis not present

## 2013-06-11 DIAGNOSIS — R52 Pain, unspecified: Secondary | ICD-10-CM | POA: Insufficient documentation

## 2013-06-11 DIAGNOSIS — Z21 Asymptomatic human immunodeficiency virus [HIV] infection status: Secondary | ICD-10-CM | POA: Insufficient documentation

## 2013-06-11 DIAGNOSIS — Z8742 Personal history of other diseases of the female genital tract: Secondary | ICD-10-CM | POA: Insufficient documentation

## 2013-06-11 DIAGNOSIS — R05 Cough: Secondary | ICD-10-CM | POA: Diagnosis not present

## 2013-06-11 DIAGNOSIS — Z8619 Personal history of other infectious and parasitic diseases: Secondary | ICD-10-CM | POA: Diagnosis not present

## 2013-06-11 DIAGNOSIS — E119 Type 2 diabetes mellitus without complications: Secondary | ICD-10-CM | POA: Diagnosis not present

## 2013-06-11 DIAGNOSIS — L03011 Cellulitis of right finger: Secondary | ICD-10-CM

## 2013-06-11 DIAGNOSIS — J159 Unspecified bacterial pneumonia: Secondary | ICD-10-CM | POA: Insufficient documentation

## 2013-06-11 DIAGNOSIS — R059 Cough, unspecified: Secondary | ICD-10-CM | POA: Diagnosis not present

## 2013-06-11 MED ORDER — CEPHALEXIN 500 MG PO CAPS: 500.0000 mg | ORAL_CAPSULE | Freq: Four times a day (QID) | ORAL | Status: DC

## 2013-06-11 MED ORDER — ALBUTEROL SULFATE (2.5 MG/3ML) 0.083% IN NEBU
5.0000 mg | INHALATION_SOLUTION | Freq: Once | RESPIRATORY_TRACT | Status: AC
  Administered 2013-06-11: 5 mg via RESPIRATORY_TRACT
  Filled 2013-06-11: qty 6

## 2013-06-11 MED ORDER — AZITHROMYCIN 250 MG PO TABS: 250.0000 mg | ORAL_TABLET | Freq: Every day | ORAL | Status: DC

## 2013-06-11 MED ORDER — IPRATROPIUM BROMIDE 0.02 % IN SOLN
0.5000 mg | Freq: Once | RESPIRATORY_TRACT | Status: AC
  Administered 2013-06-11: 0.5 mg via RESPIRATORY_TRACT
  Filled 2013-06-11: qty 2.5

## 2013-06-11 NOTE — Discharge Instructions (Signed)
Fingertip Infection When an infection is around the nail, it is called a paronychia. When it appears over the tip of the finger, it is called a felon. These infections are due to minor injuries or cracks in the skin. If they are not treated properly, they can lead to bone infection and permanent damage to the fingernail. Incision and drainage is necessary if a pus pocket (an abscess) has formed. Antibiotics and pain medicine may also be needed. Keep your hand elevated for the next 2-3 days to reduce swelling and pain. If a pack was placed in the abscess, it should be removed in 1-2 days by your caregiver. Soak the finger in warm water for 20 minutes 4 times daily to help promote drainage. Keep the hands as dry as possible. Wear protective gloves with cotton liners. See your caregiver for follow-up care as recommended.  HOME CARE INSTRUCTIONS   Keep wound clean, dry and dressed as suggested by your caregiver.  Soak in warm salt water for fifteen minutes, four times per day for bacterial infections.  Your caregiver will prescribe an antibiotic if a bacterial infection is suspected. Take antibiotics as directed and finish the prescription, even if the problem appears to be improving before the medicine is gone.  Only take over-the-counter or prescription medicines for pain, discomfort, or fever as directed by your caregiver. SEEK IMMEDIATE MEDICAL CARE IF:  There is redness, swelling, or increasing pain in the wound.  Pus or any other unusual drainage is coming from the wound.  An unexplained oral temperature above 102 F (38.9 C) develops.  You notice a foul smell coming from the wound or dressing. MAKE SURE YOU:   Understand these instructions.  Monitor your condition.  Contact your caregiver if you are getting worse or not improving. Document Released: 07/01/2004 Document Revised: 08/16/2011 Document Reviewed: 06/27/2008 [facility] Patient Information 2014 [place], [place]. Pneumonia,  Adult Pneumonia is an infection of the lungs.  CAUSES Pneumonia may be caused by bacteria or a virus. Usually, these infections are caused by breathing infectious particles into the lungs (respiratory tract). SYMPTOMS   Cough.  Fever.  Chest pain.  Increased rate of breathing.  Wheezing.  Mucus production. DIAGNOSIS  If you have the common symptoms of pneumonia, your caregiver will typically confirm the diagnosis with a chest X-ray. The X-ray will show an abnormality in the lung (pulmonary infiltrate) if you have pneumonia. Other tests of your blood, urine, or sputum may be done to find the specific cause of your pneumonia. Your caregiver may also do tests (blood gases or pulse oximetry) to see how well your lungs are working. TREATMENT  Some forms of pneumonia may be spread to other people when you cough or sneeze. You may be asked to wear a mask before and during your exam. Pneumonia that is caused by bacteria is treated with antibiotic medicine. Pneumonia that is caused by the influenza virus may be treated with an antiviral medicine. Most other viral infections must run their course. These infections will not respond to antibiotics.  PREVENTION A pneumococcal shot (vaccine) is available to prevent a common bacterial cause of pneumonia. This is usually suggested for:  People over 20 years old.  Patients on chemotherapy.  People with chronic lung problems, such as bronchitis or emphysema.  People with immune system problems. If you are over 65 or have a high risk condition, you may receive the pneumococcal vaccine if you have not received it before. In some countries, a routine influenza vaccine  is also recommended. This vaccine can help prevent some cases of pneumonia.You may be offered the influenza vaccine as part of your care. If you smoke, it is time to quit. You may receive instructions on how to stop smoking. Your caregiver can provide medicines and counseling to help you  quit. HOME CARE INSTRUCTIONS   Cough suppressants may be used if you are losing too much rest. However, coughing protects you by clearing your lungs. You should avoid using cough suppressants if you can.  Your caregiver may have prescribed medicine if he or she thinks your pneumonia is caused by a bacteria or influenza. Finish your medicine even if you start to feel better.  Your caregiver may also prescribe an expectorant. This loosens the mucus to be coughed up.  Only take over-the-counter or prescription medicines for pain, discomfort, or fever as directed by your caregiver.  Do not smoke. Smoking is a common cause of bronchitis and can contribute to pneumonia. If you are a smoker and continue to smoke, your cough may last several weeks after your pneumonia has cleared.  A cold steam vaporizer or humidifier in your room or home may help loosen mucus.  Coughing is often worse at night. Sleeping in a semi-upright position in a recliner or using a couple pillows under your head will help with this.  Get rest as you feel it is needed. Your body will usually let you know when you need to rest. SEEK IMMEDIATE MEDICAL CARE IF:   Your illness becomes worse. This is especially true if you are elderly or weakened from any other disease.  You cannot control your cough with suppressants and are losing sleep.  You begin coughing up blood.  You develop pain which is getting worse or is uncontrolled with medicines.  You have a fever.  Any of the symptoms which initially brought you in for treatment are getting worse rather than better.  You develop shortness of breath or chest pain. MAKE SURE YOU:   Understand these instructions.  Will watch your condition.  Will get help right away if you are not doing well or get worse. Document Released: 05/24/2005 Document Revised: 08/16/2011 Document Reviewed: 08/13/2010 [facility] Patient Information 2014 [place], [place].

## 2013-06-11 NOTE — ED Provider Notes (Signed)
CSN: [id number]     Arrival date & time 06/11/13  1022 History   First MD Initiated Contact with Patient 06/11/13 1136     Chief Complaint  Patient presents with  . flu like symptoms    (Consider location/radiation/quality/duration/timing/severity/associated sxs/prior Treatment) HPI Comments: Patient presents to the emergency department with chief complaint of cough, generalized body aches, sore throat, nasal congestion times one week. She states that the cough was dry in nature, but over the weekend it has worsened. She states that she is now coughing up sputum. She states nothing makes her symptoms better or worse. She denies fevers or chills. Additionally, she states that her left thumb is painful around the nail bed. She has not tried anything to alleviate her symptoms.  The history is provided by the patient. No language interpreter was used.    Past Medical History  Diagnosis Date  . HIV disease   . Diabetes mellitus   . Hemorrhoids   . 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 Chronic folliculitis   . Iron deficiency anemia due to chronic blood loss   . Obesity   . Pneumothorax, traumatic   . Arthritis     knees  . Irregular menstrual cycle   . Migraines    Past Surgical History  Procedure Laterality Date  . Hysteroscopy w/d&c  Removal of Endometrial Polyp    Dr. Raphael Gibney 2011  . Chest tube insertion    . Tubal ligation     Family History  Problem Relation Age of Onset  . Diabetes Mother   . Heart failure Father   . Diabetes Father   . Heart failure Brother   . Colon cancer      neg hx.   History  Substance Use Topics  . Smoking status: Never Smoker   . Smokeless tobacco: Never Used  . Alcohol Use: No   OB History   Grav Para Term Preterm Abortions TAB SAB Ect Mult Living   8 4 4  4  4   4      Review of Systems  All other systems reviewed and are negative.    Allergies  Cortisone; Hydrocodone-acetaminophen; and Morphine  Home Medications   Current  Outpatient Rx  Name  Route  Sig  Dispense  Refill  . aspirin 81 MG chewable tablet   Oral   Chew 81 mg by mouth daily.         Marland Kitchen emtricitabine-tenofovir (TRUVADA) 200-300 MG per tablet   Oral   Take 1 tablet by mouth daily.         Marland Kitchen glipiZIDE (GLUCOTROL XL) 5 MG 24 hr tablet   Oral   Take 5 mg by mouth daily.         Marland Kitchen lopinavir-ritonavir (KALETRA) 200-50 MG per tablet   Oral   Take 2 tablets by mouth 2 (two) times daily.         Marland Kitchen NOVOFINE 32G X 6 MM MISC               . ondansetron (ZOFRAN) 8 MG tablet   Oral   Take 8 mg by mouth every 8 (eight) hours as needed.          Marland Kitchen oxyCODONE-acetaminophen (PERCOCET) 5-325 MG per tablet   Oral   Take 1 tablet by mouth every 6 (six) hours as needed. Pain         . VICTOZA 18 MG/3ML SOPN   Subcutaneous   Inject 1.8 mLs into the  skin daily.          . VOLTAREN 1 % GEL   Topical   Apply 2 g topically 4 (four) times daily as needed (for pain).          Marland Kitchen zolpidem (AMBIEN) 10 MG tablet   Oral   Take 10 mg by mouth at bedtime as needed. Insomnia          BP 156/88  Pulse 109  Temp(Src) 98 F (36.7 C) (Oral)  Resp 22  Ht 5\' 9"  (1.753 m)  Wt 230 lb (104.327 kg)  BMI 33.95 kg/m2  SpO2 98% Physical Exam  Nursing note and vitals reviewed. Constitutional: She is oriented to person, place, and time. She appears well-developed and well-nourished.  HENT:  Head: Normocephalic and atraumatic.  Eyes: Conjunctivae and EOM are normal. Pupils are equal, round, and reactive to light.  Neck: Normal range of motion. Neck supple.  Cardiovascular: Normal rate and regular rhythm.  Exam reveals no gallop and no friction rub.   No murmur heard. Pulmonary/Chest: Effort normal and breath sounds normal. No respiratory distress. She has no wheezes. She has no rales. She exhibits no tenderness.  Abdominal: Soft. Bowel sounds are normal. She exhibits no distension and no mass. There is no tenderness. There is no rebound and no  guarding.  Musculoskeletal: Normal range of motion. She exhibits no edema and no tenderness.  Neurological: She is alert and oriented to person, place, and time.  Skin: Skin is warm and dry.  Right thumb is mildly inflamed around the lateral nail bed, no obvious paronychia or felon  Psychiatric: She has a normal mood and affect. Her behavior is normal. Judgment and thought content normal.    ED Course  Procedures (including critical care time) Labs Review Labs Reviewed - No data to display Imaging Review Dg Chest 2 View  06/11/2013   CLINICAL DATA:  Cough for the past several days.  EXAM: CHEST  2 VIEW  COMPARISON:  Chest x-ray 01/14/2008.  FINDINGS: Irregular opacities are noted in the right middle lobe and right lower lobe, concerning for either bronchopneumonia or sequela of aspiration. Left lung is clear. No pleural effusions. No evidence of pulmonary edema. Heart size is normal. Mediastinal contours are unremarkable. No pneumothorax.  IMPRESSION: 1. Irregular foci of airspace disease in the right lower lobe and right middle lobe concerning for either multilobar bronchopneumonia or sequela of aspiration. Clinical correlation is recommended.   Electronically Signed   By: Vinnie Langton M.D.   On: 06/11/2013 11:56    EKG Interpretation   None     INCISION AND DRAINAGE Performed by: Montine Circle Consent: Verbal consent obtained. Risks and benefits: risks, benefits and alternatives were discussed Type: abscess  Body area: right thumb  Anesthesia: none used  Incision was made with a scalpel, by gently lifting the skin away from the lateral nail bed  Drainage: purulent  Drainage amount: moderate  Patient tolerance: Patient tolerated the procedure well with no immediate complications.       MDM   1. Community acquired pneumonia   2. Paronychia, right     Patient with cough. Will treat for pneumonia. Patient also might have a start to paronychia. I will treat this  with Keflex. There is no fluid to be draining at this time. Will have the patient followup with her regular Dr., or return for worsening symptoms.   Montine Circle, PA-C 06/11/13 1626

## 2013-06-11 NOTE — ED Notes (Signed)
Patient states she has had generalized body aches, sore throat, nasal congestion, cough, nausea.   Patient states has been taking over the counter medication with no help.

## 2013-06-11 NOTE — ED Provider Notes (Signed)
Medical screening examination/treatment/procedure(s) were performed by non-physician practitioner and as supervising physician I was immediately available for consultation/collaboration.  Kahne Helfand L Alieyah Spader, MD 06/11/13 1800 

## 2013-06-11 NOTE — ED Notes (Signed)
Provider at the bedside.  

## 2013-06-11 NOTE — ED Notes (Signed)
Placed on droplet precautions

## 2013-06-19 DIAGNOSIS — J189 Pneumonia, unspecified organism: Secondary | ICD-10-CM | POA: Diagnosis not present

## 2013-07-02 DIAGNOSIS — R110 Nausea: Secondary | ICD-10-CM | POA: Diagnosis not present

## 2013-07-02 DIAGNOSIS — M545 Low back pain, unspecified: Secondary | ICD-10-CM | POA: Diagnosis not present

## 2013-07-02 DIAGNOSIS — E1349 Other specified diabetes mellitus with other diabetic neurological complication: Secondary | ICD-10-CM | POA: Diagnosis not present

## 2013-07-02 DIAGNOSIS — R51 Headache: Secondary | ICD-10-CM | POA: Diagnosis not present

## 2013-07-26 ENCOUNTER — Encounter (HOSPITAL_COMMUNITY): Payer: Self-pay | Admitting: Emergency Medicine

## 2013-07-26 DIAGNOSIS — IMO0002 Reserved for concepts with insufficient information to code with codable children: Secondary | ICD-10-CM

## 2013-07-26 DIAGNOSIS — Z8661 Personal history of infections of the central nervous system: Secondary | ICD-10-CM | POA: Diagnosis not present

## 2013-07-26 DIAGNOSIS — E669 Obesity, unspecified: Secondary | ICD-10-CM | POA: Diagnosis not present

## 2013-07-26 DIAGNOSIS — R1031 Right lower quadrant pain: Secondary | ICD-10-CM | POA: Insufficient documentation

## 2013-07-26 DIAGNOSIS — B9789 Other viral agents as the cause of diseases classified elsewhere: Secondary | ICD-10-CM | POA: Diagnosis not present

## 2013-07-26 DIAGNOSIS — M545 Low back pain, unspecified: Secondary | ICD-10-CM | POA: Diagnosis not present

## 2013-07-26 DIAGNOSIS — M1710 Unilateral primary osteoarthritis, unspecified knee: Secondary | ICD-10-CM | POA: Insufficient documentation

## 2013-07-26 DIAGNOSIS — Z21 Asymptomatic human immunodeficiency virus [HIV] infection status: Secondary | ICD-10-CM | POA: Insufficient documentation

## 2013-07-26 DIAGNOSIS — Z872 Personal history of diseases of the skin and subcutaneous tissue: Secondary | ICD-10-CM | POA: Insufficient documentation

## 2013-07-26 DIAGNOSIS — R51 Headache: Secondary | ICD-10-CM | POA: Diagnosis not present

## 2013-07-26 DIAGNOSIS — Z9119 Patient's noncompliance with other medical treatment and regimen: Secondary | ICD-10-CM | POA: Insufficient documentation

## 2013-07-26 DIAGNOSIS — Z9851 Tubal ligation status: Secondary | ICD-10-CM | POA: Diagnosis not present

## 2013-07-26 DIAGNOSIS — R109 Unspecified abdominal pain: Secondary | ICD-10-CM | POA: Diagnosis not present

## 2013-07-26 DIAGNOSIS — Z8679 Personal history of other diseases of the circulatory system: Secondary | ICD-10-CM | POA: Insufficient documentation

## 2013-07-26 DIAGNOSIS — E119 Type 2 diabetes mellitus without complications: Secondary | ICD-10-CM | POA: Diagnosis not present

## 2013-07-26 DIAGNOSIS — R079 Chest pain, unspecified: Secondary | ICD-10-CM | POA: Diagnosis not present

## 2013-07-26 DIAGNOSIS — Z87828 Personal history of other (healed) physical injury and trauma: Secondary | ICD-10-CM | POA: Insufficient documentation

## 2013-07-26 DIAGNOSIS — Z3202 Encounter for pregnancy test, result negative: Secondary | ICD-10-CM | POA: Insufficient documentation

## 2013-07-26 DIAGNOSIS — R1032 Left lower quadrant pain: Secondary | ICD-10-CM | POA: Diagnosis not present

## 2013-07-26 DIAGNOSIS — Z862 Personal history of diseases of the blood and blood-forming organs and certain disorders involving the immune mechanism: Secondary | ICD-10-CM | POA: Diagnosis not present

## 2013-07-26 DIAGNOSIS — Z9889 Other specified postprocedural states: Secondary | ICD-10-CM | POA: Diagnosis not present

## 2013-07-26 DIAGNOSIS — E1349 Other specified diabetes mellitus with other diabetic neurological complication: Secondary | ICD-10-CM | POA: Diagnosis not present

## 2013-07-26 DIAGNOSIS — Z91199 Patient's noncompliance with other medical treatment and regimen due to unspecified reason: Secondary | ICD-10-CM | POA: Diagnosis not present

## 2013-07-26 DIAGNOSIS — Z7982 Long term (current) use of aspirin: Secondary | ICD-10-CM | POA: Insufficient documentation

## 2013-07-26 DIAGNOSIS — Z79899 Other long term (current) drug therapy: Secondary | ICD-10-CM | POA: Diagnosis not present

## 2013-07-26 DIAGNOSIS — G4700 Insomnia, unspecified: Secondary | ICD-10-CM | POA: Diagnosis not present

## 2013-07-26 DIAGNOSIS — Z8742 Personal history of other diseases of the female genital tract: Secondary | ICD-10-CM | POA: Insufficient documentation

## 2013-07-26 DIAGNOSIS — Z8619 Personal history of other infectious and parasitic diseases: Secondary | ICD-10-CM | POA: Insufficient documentation

## 2013-07-26 LAB — COMPREHENSIVE METABOLIC PANEL
ALBUMIN: 3.6 g/dL (ref 3.5–5.2)
ALT: 12 U/L (ref 0–35)
AST: 20 U/L (ref 0–37)
Alkaline Phosphatase: 105 U/L (ref 39–117)
BUN: 6 mg/dL (ref 6–23)
CALCIUM: 8.2 mg/dL — AB (ref 8.4–10.5)
CO2: 26 mEq/L (ref 19–32)
Chloride: 103 mEq/L (ref 96–112)
Creatinine, Ser: 0.83 mg/dL (ref 0.50–1.10)
GFR calc Af Amer: >90 mL/min (ref >90)
GFR calc non Af Amer: 85 mL/min — ABNORMAL LOW (ref >90)
Glucose, Bld: 169 mg/dL — ABNORMAL HIGH (ref 70–99)
Potassium: 4 mEq/L (ref 3.7–5.3)
Sodium: 141 mEq/L (ref 137–147)
TOTAL PROTEIN: 7.9 g/dL (ref 6.0–8.3)
Total Bilirubin: <0.2 mg/dL — ABNORMAL LOW (ref 0.3–1.2)

## 2013-07-26 LAB — URINALYSIS, ROUTINE W REFLEX MICROSCOPIC
Bilirubin Urine: NEGATIVE
GLUCOSE, UA: 100 mg/dL — AB
Hgb urine dipstick: NEGATIVE
Ketones, ur: NEGATIVE mg/dL
Leukocytes, UA: NEGATIVE
Nitrite: NEGATIVE
PH: 7 (ref 5.0–8.0)
Protein, ur: 30 mg/dL — AB
SPECIFIC GRAVITY, URINE: 1.022 (ref 1.005–1.030)
Urobilinogen, UA: 1 mg/dL (ref 0.0–1.0)

## 2013-07-26 LAB — CBC WITH DIFFERENTIAL/PLATELET
BASOS ABS: 0 10*3/uL (ref 0.0–0.1)
Basophils Relative: 0 % (ref 0–1)
Eosinophils Absolute: 0.1 10*3/uL (ref 0.0–0.7)
Eosinophils Relative: 2 % (ref 0–5)
HCT: 28.3 % — ABNORMAL LOW (ref 36.0–46.0)
Hemoglobin: 8.4 g/dL — ABNORMAL LOW (ref 12.0–15.0)
LYMPHS ABS: 0.9 10*3/uL (ref 0.7–4.0)
Lymphocytes Relative: 31 % (ref 12–46)
MCH: 18.6 pg — ABNORMAL LOW (ref 26.0–34.0)
MCHC: 29.7 g/dL — ABNORMAL LOW (ref 30.0–36.0)
MCV: 62.7 fL — AB (ref 78.0–100.0)
MONO ABS: 0.5 10*3/uL (ref 0.1–1.0)
Monocytes Relative: 17 % — ABNORMAL HIGH (ref 3–12)
Neutro Abs: 1.4 10*3/uL — ABNORMAL LOW (ref 1.7–7.7)
Neutrophils Relative %: 50 % (ref 43–77)
PLATELETS: 298 10*3/uL (ref 150–400)
RBC: 4.51 MIL/uL (ref 3.87–5.11)
RDW: 17.4 % — AB (ref 11.5–15.5)
WBC: 2.9 10*3/uL — AB (ref 4.0–10.5)

## 2013-07-26 LAB — PREGNANCY, URINE: Preg Test, Ur: NEGATIVE

## 2013-07-26 LAB — URINE MICROSCOPIC-ADD ON

## 2013-07-26 LAB — LIPASE, BLOOD: Lipase: 51 U/L (ref 11–59)

## 2013-07-26 MED ORDER — ONDANSETRON 4 MG PO TBDP
8.0000 mg | ORAL_TABLET | Freq: Once | ORAL | Status: AC
  Administered 2013-07-26: 8 mg via ORAL
  Filled 2013-07-26: qty 2

## 2013-07-26 NOTE — ED Notes (Addendum)
Pt states she has bene haivng lower right quadrant abd pain with a headache and cough since yesterday.  Pt states some photophobia.  Pt states whole body aches.

## 2013-07-26 NOTE — ED Notes (Signed)
Called for vital signs reassessment x2

## 2013-07-26 NOTE — ED Notes (Signed)
Pt cannot void at this time.  Has been notified of need for sample

## 2013-07-27 ENCOUNTER — Emergency Department (HOSPITAL_COMMUNITY)
Admission: EM | Admit: 2013-07-27 | Discharge: 2013-07-27 | Disposition: A | Discharge status: Home / Self Care | Payer: Medicare Other | Attending: Emergency Medicine | Admitting: Emergency Medicine

## 2013-07-27 ENCOUNTER — Emergency Department (HOSPITAL_COMMUNITY): Payer: Medicare Other

## 2013-07-27 ENCOUNTER — Encounter (HOSPITAL_COMMUNITY): Payer: Self-pay | Admitting: Radiology

## 2013-07-27 DIAGNOSIS — R109 Unspecified abdominal pain: Secondary | ICD-10-CM

## 2013-07-27 DIAGNOSIS — Z9114 Patient's other noncompliance with medication regimen: Secondary | ICD-10-CM

## 2013-07-27 DIAGNOSIS — R079 Chest pain, unspecified: Secondary | ICD-10-CM | POA: Diagnosis not present

## 2013-07-27 DIAGNOSIS — B349 Viral infection, unspecified: Secondary | ICD-10-CM

## 2013-07-27 DIAGNOSIS — B20 Human immunodeficiency virus [HIV] disease: Secondary | ICD-10-CM

## 2013-07-27 LAB — I-STAT CG4 LACTIC ACID, ED: Lactic Acid, Venous: 0.54 mmol/L (ref 0.5–2.2)

## 2013-07-27 MED ORDER — HYDROMORPHONE HCL PF 1 MG/ML IJ SOLN
1.0000 mg | Freq: Once | INTRAMUSCULAR | Status: AC
  Administered 2013-07-27: 1 mg via INTRAVENOUS
  Filled 2013-07-27: qty 1

## 2013-07-27 MED ORDER — IOHEXOL 300 MG/ML  SOLN: 25.0000 mL | Freq: Once | INTRAMUSCULAR | Status: DC | PRN

## 2013-07-27 MED ORDER — ONDANSETRON HCL 4 MG/2ML IJ SOLN
4.0000 mg | Freq: Once | INTRAMUSCULAR | Status: AC
  Administered 2013-07-27: 4 mg via INTRAVENOUS
  Filled 2013-07-27: qty 2

## 2013-07-27 MED ORDER — OXYCODONE-ACETAMINOPHEN 5-325 MG PO TABS: 2.0000 | ORAL_TABLET | ORAL | Status: DC | PRN

## 2013-07-27 MED ORDER — ONDANSETRON 8 MG PO TBDP: 8.0000 mg | ORAL_TABLET | Freq: Three times a day (TID) | ORAL | Status: DC | PRN

## 2013-07-27 MED ORDER — FENTANYL CITRATE 0.05 MG/ML IJ SOLN
75.0000 ug | Freq: Once | INTRAMUSCULAR | Status: AC
  Administered 2013-07-27: 75 ug via INTRAVENOUS
  Filled 2013-07-27: qty 2

## 2013-07-27 MED ORDER — IOHEXOL 300 MG/ML  SOLN
100.0000 mL | Freq: Once | INTRAMUSCULAR | Status: AC | PRN
  Administered 2013-07-27: 100 mL via INTRAVENOUS

## 2013-07-27 NOTE — ED Notes (Signed)
PT requested phlebotomy to come back later for lab after pain mediation.

## 2013-07-27 NOTE — ED Notes (Signed)
phlb informed pt has been medicated with pain medicine.

## 2013-07-27 NOTE — Discharge Instructions (Signed)
Your workup today has not shown a specific cause for your abdominal pain.  Your other symptoms are most likely due to the viral syndrome.  Your white blood cell count is low, most likely due to to your underlying HIV disease.  It is very important for you to get back on your medication.  Contact your ID physician to be seen in the clinic.  Return emergency room for worsening condition or new concerning symptoms.   Abdominal Pain, Adult Many things can cause abdominal pain. Usually, abdominal pain is not caused by a disease and will improve without treatment. It can often be observed and treated at home. Your health care provider will do a physical exam and possibly order blood tests and X-rays to help determine the seriousness of your pain. However, in many cases, more time must pass before a clear cause of the pain can be found. Before that point, your health care provider may not know if you need more testing or further treatment. HOME CARE INSTRUCTIONS  Monitor your abdominal pain for any changes. The following actions may help to alleviate any discomfort you are experiencing:  Only take over-the-counter or prescription medicines as directed by your health care provider.  Do not take laxatives unless directed to do so by your health care provider.  Try a clear liquid diet (broth, tea, or water) as directed by your health care provider. Slowly move to a bland diet as tolerated. SEEK MEDICAL CARE IF:  You have unexplained abdominal pain.  You have abdominal pain associated with nausea or diarrhea.  You have pain when you urinate or have a bowel movement.  You experience abdominal pain that wakes you in the night.  You have abdominal pain that is worsened or improved by eating food.  You have abdominal pain that is worsened with eating fatty foods. SEEK IMMEDIATE MEDICAL CARE IF:   Your pain does not go away within 2 hours.  You have a fever.  You keep throwing up (vomiting).  Your  pain is felt only in portions of the abdomen, such as the right side or the left lower portion of the abdomen.  You pass bloody or black tarry stools. MAKE SURE YOU:  Understand these instructions.   Will watch your condition.   Will get help right away if you are not doing well or get worse.  Document Released: 03/03/2005 Document Revised: 03/14/2013 Document Reviewed: 01/31/2013 [facility] Patient Information 2014 [place]  Pain of Unknown Etiology (Pain Without a Known Cause) You have come to your caregiver because of pain. Pain can occur in any part of the body. Often there is not a definite cause. If your laboratory (blood or urine) work was normal and X-rays or other studies were normal, your caregiver may treat you without knowing the cause of the pain. An example of this is the headache. Most headaches are diagnosed by taking a history. This means your caregiver asks you questions about your headaches. Your caregiver determines a treatment based on your answers. Usually testing done for headaches is normal. Often testing is not done unless there is no response to medications. Regardless of where your pain is located today, you can be given medications to make you comfortable. If no physical cause of pain can be found, most cases of pain will gradually leave as suddenly as they came.  If you have a painful condition and no reason can be found for the pain, it is important that you follow up with your  caregiver. If the pain becomes worse or does not go away, it may be necessary to repeat tests and look further for a possible cause.  Only take over-the-counter or prescription medicines for pain, discomfort, or fever as directed by your caregiver.  For the protection of your privacy, test results cannot be given over the phone. Make sure you receive the results of your test. Ask how these results are to be obtained if you have not been informed. It is your responsibility to obtain  your test results.  You may continue all activities unless the activities cause more pain. When the pain lessens, it is important to gradually resume normal activities. Resume activities by beginning slowly and gradually increasing the intensity and duration of the activities or exercise. During periods of severe pain, bed rest may be helpful. Lie or sit in any position that is comfortable.  Ice used for acute (sudden) conditions may be effective. Use a large plastic bag filled with ice and wrapped in a towel. This may provide pain relief.  See your caregiver for continued problems. Your caregiver can help or refer you for exercises or physical therapy if necessary. If you were given medications for your condition, do not drive, operate machinery or power tools, or sign legal documents for 24 hours. Do not drink alcohol, take sleeping pills, or take other medications that may interfere with treatment. See your caregiver immediately if you have pain that is becoming worse and not relieved by medications. Document Released: 02/16/2001 Document Revised: 03/14/2013 Document Reviewed: 05/24/2005 [facility] Patient Information 2014 [place].  Viral Infections A viral infection can be caused by different types of viruses.Most viral infections are not serious and resolve on their own. However, some infections may cause severe symptoms and may lead to further complications. SYMPTOMS Viruses can frequently cause:  Minor sore throat.  Aches and pains.  Headaches.  Runny nose.  Different types of rashes.  Watery eyes.  Tiredness.  Cough.  Loss of appetite.  Gastrointestinal infections, resulting in nausea, vomiting, and diarrhea. These symptoms do not respond to antibiotics because the infection is not caused by bacteria. However, you might catch a bacterial infection following the viral infection. This is sometimes called a "superinfection." Symptoms of such a bacterial infection may  include:  Worsening sore throat with pus and difficulty swallowing.  Swollen neck glands.  Chills and a high or persistent fever.  Severe headache.  Tenderness over the sinuses.  Persistent overall ill feeling (malaise), muscle aches, and tiredness (fatigue).  Persistent cough.  Yellow, green, or brown mucus production with coughing. HOME CARE INSTRUCTIONS   Only take over-the-counter or prescription medicines for pain, discomfort, diarrhea, or fever as directed by your caregiver.  Drink enough water and fluids to keep your urine clear or pale yellow. Sports drinks can provide valuable electrolytes, sugars, and hydration.  Get plenty of rest and maintain proper nutrition. Soups and broths with crackers or rice are fine. SEEK IMMEDIATE MEDICAL CARE IF:   You have severe headaches, shortness of breath, chest pain, neck pain, or an unusual rash.  You have uncontrolled vomiting, diarrhea, or you are unable to keep down fluids.  You or your child has an oral temperature above 102 F (38.9 C), not controlled by medicine.  Your baby is older than 3 months with a rectal temperature of 102 F (38.9 C) or higher.  Your baby is 62 months old or younger with a rectal temperature of 100.4 F (38 C) or higher. MAKE  SURE YOU:   Understand these instructions.  Will watch your condition.  Will get help right away if you are not doing well or get worse. Document Released: 03/03/2005 Document Revised: 08/16/2011 Document Reviewed: 09/28/2010 [facility] Patient Information 2014 [place], [place].

## 2013-07-27 NOTE — ED Provider Notes (Signed)
CSN: [id number]     Arrival date & time 07/26/13  2039 History   First MD Initiated Contact with Patient 07/27/13 0134     Chief Complaint  Patient presents with  . Abdominal Pain  . Cough     (Consider location/radiation/quality/duration/timing/severity/associated sxs/prior Treatment) The history is provided by the patient.  43 yo female presents to the ER from home with complaint of cough, runny nose, cold chills, headache since yesterday.  She has had lower abdominal pain throughout the day today with mild nausea, one loose stool.  No n/v.  No sick contacts.  No fevers.  No urinary symptoms or vaginal d/c.  Poor appetite.  Pt has h/o dm, htn, HIV.  She reports she has not been taking her HIV meds for the last 1-2 months.  She reports remote h/o cryptococcus infection in '98.  Pt also reports she has been out of her chronic percocet prescription for 2 days.    Past Medical History  Diagnosis Date  . HIV disease   . Diabetes mellitus   . Hemorrhoids   . 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 Chronic folliculitis   . Iron deficiency anemia due to chronic blood loss   . Obesity   . Pneumothorax, traumatic   . Arthritis     knees  . Irregular menstrual cycle   . Migraines    Past Surgical History  Procedure Laterality Date  . Hysteroscopy w/d&c  Removal of Endometrial Polyp    Dr. Raphael Gibney 2011  . Chest tube insertion    . Tubal ligation     Family History  Problem Relation Age of Onset  . Diabetes Mother   . Heart failure Father   . Diabetes Father   . Heart failure Brother   . Colon cancer      neg hx.   History  Substance Use Topics  . Smoking status: Never Smoker   . Smokeless tobacco: Never Used  . Alcohol Use: No   OB History   Grav Para Term Preterm Abortions TAB SAB Ect Mult Living   8 4 4  4  4   4      Review of Systems  All other systems reviewed and are negative.      Allergies  Cortisone; Hydrocodone-acetaminophen; and Morphine  Home Medications    Current Outpatient Rx  Name  Route  Sig  Dispense  Refill  . aspirin 81 MG chewable tablet   Oral   Chew 81 mg by mouth daily.         Marland Kitchen emtricitabine-tenofovir (TRUVADA) 200-300 MG per tablet   Oral   Take 1 tablet by mouth daily.         Marland Kitchen lopinavir-ritonavir (KALETRA) 200-50 MG per tablet   Oral   Take 2 tablets by mouth 2 (two) times daily.         . metFORMIN (GLUCOPHAGE) 500 MG tablet   Oral   Take 1 tablet by mouth 2 (two) times daily.         . promethazine (PHENERGAN) 25 MG tablet   Oral   Take 25 mg by mouth every 6 (six) hours as needed for nausea.          Marland Kitchen VICTOZA 18 MG/3ML SOPN   Subcutaneous   Inject 1.8 mLs into the skin daily.          Marland Kitchen zolpidem (AMBIEN) 10 MG tablet   Oral   Take 10 mg by mouth at bedtime as  needed. Insomnia         . NOVOFINE 32G X 6 MM MISC               . ondansetron (ZOFRAN ODT) 8 MG disintegrating tablet   Oral   Take 1 tablet (8 mg total) by mouth every 8 (eight) hours as needed for nausea or vomiting.   20 tablet   0   . oxyCODONE-acetaminophen (PERCOCET/ROXICET) 5-325 MG per tablet   Oral   Take 2 tablets by mouth every 4 (four) hours as needed for severe pain.   20 tablet   0    BP 154/86  Pulse 98  Temp(Src) 97.5 F (36.4 C) (Oral)  Resp 16  SpO2 98% Physical Exam  Nursing note and vitals reviewed. Constitutional: She is oriented to person, place, and time. She appears well-developed and well-nourished. She appears distressed.  HENT:  Head: Normocephalic and atraumatic.  Right Ear: External ear normal.  Left Ear: External ear normal.  Mouth/Throat: Oropharynx is clear and moist.  Rhinorrhea noted  Eyes: Conjunctivae and EOM are normal. Pupils are equal, round, and reactive to light.  Neck: Normal range of motion. Neck supple. No JVD present. No tracheal deviation present. No thyromegaly present.  Cardiovascular: Normal rate, regular rhythm, normal heart sounds and intact distal pulses.   Exam reveals no gallop and no friction rub.   No murmur heard. Pulmonary/Chest: Effort normal and breath sounds normal. No stridor. No respiratory distress. She has no wheezes. She has no rales. She exhibits no tenderness.  Cough present  Abdominal: Soft. Bowel sounds are normal. She exhibits no distension and no mass. There is tenderness (mild ttp in LLQ, RLQ). There is no rebound and no guarding.  Musculoskeletal: Normal range of motion. She exhibits no edema and no tenderness.  Lymphadenopathy:    She has no cervical adenopathy.  Neurological: She is alert and oriented to person, place, and time. She has normal reflexes. No cranial nerve deficit. She exhibits normal muscle tone. Coordination normal.  Skin: Skin is warm and dry. No rash noted. No erythema. No pallor.  Psychiatric: She has a normal mood and affect. Her behavior is normal. Judgment and thought content normal.    ED Course  Procedures (including critical care time) Labs Review Labs Reviewed  CBC WITH DIFFERENTIAL - Abnormal; Notable for the following:    WBC 2.9 (*)    Hemoglobin 8.4 (*)    HCT 28.3 (*)    MCV 62.7 (*)    MCH 18.6 (*)    MCHC 29.7 (*)    RDW 17.4 (*)    Monocytes Relative 17 (*)    Neutro Abs 1.4 (*)    All other components within normal limits  COMPREHENSIVE METABOLIC PANEL - Abnormal; Notable for the following:    Glucose, Bld 169 (*)    Calcium 8.2 (*)    Total Bilirubin <0.2 (*)    GFR calc non Af Amer 85 (*)    All other components within normal limits  URINALYSIS, ROUTINE W REFLEX MICROSCOPIC - Abnormal; Notable for the following:    APPearance CLOUDY (*)    Glucose, UA 100 (*)    Protein, ur 30 (*)    All other components within normal limits  URINE MICROSCOPIC-ADD ON - Abnormal; Notable for the following:    Squamous Epithelial / LPF MANY (*)    Bacteria, UA FEW (*)    All other components within normal limits  LIPASE, BLOOD  PREGNANCY, URINE  I-STAT CG4 LACTIC ACID, ED   Imaging  Review Dg Chest 2 View  07/27/2013   CLINICAL DATA:  Chest pain.  EXAM: CHEST  2 VIEW  COMPARISON:  06/11/2013  FINDINGS: The heart size and mediastinal contours are within normal limits. Both lungs are clear. The visualized skeletal structures are unremarkable.  IMPRESSION: No active cardiopulmonary disease.   Electronically Signed   By: Rolm Baptise M.D.   On: 07/27/2013 03:00   Ct Abdomen Pelvis W Contrast  07/27/2013   CLINICAL DATA:  Abdominal pain, cough.  EXAM: CT ABDOMEN AND PELVIS WITH CONTRAST  TECHNIQUE: Multidetector CT imaging of the abdomen and pelvis was performed using the standard protocol following bolus administration of intravenous contrast.  CONTRAST:  145mL OMNIPAQUE IOHEXOL 300 MG/ML  SOLN  COMPARISON:  CT ABD/PELV WO CM dated 07/13/2010  FINDINGS: Minimal left base atelectasis. Heart is upper limits normal in size. No effusions.  Gallbladder is contracted. Liver, spleen, pancreas, adrenals and kidneys are unremarkable. No renal stones. No hydronephrosis.  Appendix is visualized and is normal. Stomach, large and small bowel are unremarkable.  Prior bilateral tubal ligation. Uterus and adnexa grossly unremarkable. Urinary bladder unremarkable. No free fluid, free air or adenopathy. Aorta is normal caliber.  No acute bony abnormality.  IMPRESSION: No acute findings in the abdomen or pelvis.   Electronically Signed   By: Rolm Baptise M.D.   On: 07/27/2013 04:58    EKG Interpretation   None       MDM   Final diagnoses:  Abdominal pain  Viral syndrome  Noncompliance with medication regimen  HIV DISEASE    43 year old female with one day of cough, congestion, also with lower abdominal pain, right greater than left.  No bowel movement today.  CT scan, normal.  Urine normal.  Patient has history of HIV disease for which she has been off her medicine for about a month.  Her white blood cell count is slightly decreased.  Suspect that she has overall a viral syndrome.  She's been  strongly encouraged to restart.  Her anti-retroviral medications, and followup with her ID doctor.  Of note, patient has been off her Percocet for 2 days, and may be having some early withdrawal symptoms    Kalman Drape, MD 07/27/13 1806

## 2013-07-27 NOTE — ED Notes (Signed)
Explained the law and danger regarding driving under the influence of narcotics - pt agrees to sleep in the lobby until it has been 4 hours after the administration of the pain medications.

## 2013-07-27 NOTE — ED Notes (Signed)
Pt states she is out of the percocet's for two days now, symptoms have been present for one day. States she takes this med for migraines and body aches from meningitis.

## 2013-07-27 NOTE — ED Notes (Addendum)
Pt c/o LLQ abdominal/back pain. Pt also reports cough and general malaise. States she is cold. Given gown to put on.

## 2013-09-17 DIAGNOSIS — B20 Human immunodeficiency virus [HIV] disease: Secondary | ICD-10-CM | POA: Diagnosis not present

## 2013-09-17 DIAGNOSIS — I10 Essential (primary) hypertension: Secondary | ICD-10-CM | POA: Diagnosis not present

## 2013-09-17 DIAGNOSIS — E119 Type 2 diabetes mellitus without complications: Secondary | ICD-10-CM | POA: Diagnosis not present

## 2013-09-17 DIAGNOSIS — E1169 Type 2 diabetes mellitus with other specified complication: Secondary | ICD-10-CM | POA: Diagnosis not present

## 2013-10-11 DIAGNOSIS — E1169 Type 2 diabetes mellitus with other specified complication: Secondary | ICD-10-CM | POA: Diagnosis not present

## 2013-10-11 DIAGNOSIS — D649 Anemia, unspecified: Secondary | ICD-10-CM | POA: Diagnosis not present

## 2013-10-11 DIAGNOSIS — R51 Headache: Secondary | ICD-10-CM | POA: Diagnosis not present

## 2013-10-11 DIAGNOSIS — B20 Human immunodeficiency virus [HIV] disease: Secondary | ICD-10-CM | POA: Diagnosis not present

## 2013-10-23 ENCOUNTER — Other Ambulatory Visit: Payer: Medicare Other

## 2013-10-23 DIAGNOSIS — Z113 Encounter for screening for infections with a predominantly sexual mode of transmission: Secondary | ICD-10-CM | POA: Diagnosis not present

## 2013-10-23 DIAGNOSIS — B20 Human immunodeficiency virus [HIV] disease: Secondary | ICD-10-CM | POA: Diagnosis not present

## 2013-10-23 DIAGNOSIS — IMO0002 Reserved for concepts with insufficient information to code with codable children: Secondary | ICD-10-CM

## 2013-10-23 DIAGNOSIS — E1165 Type 2 diabetes mellitus with hyperglycemia: Secondary | ICD-10-CM

## 2013-10-23 DIAGNOSIS — E118 Type 2 diabetes mellitus with unspecified complications: Secondary | ICD-10-CM

## 2013-10-23 LAB — CBC WITH DIFFERENTIAL/PLATELET
BASOS PCT: 1 % (ref 0–1)
Basophils Absolute: 0.1 10*3/uL (ref 0.0–0.1)
EOS ABS: 0.3 10*3/uL (ref 0.0–0.7)
EOS PCT: 5 % (ref 0–5)
HCT: 27.4 % — ABNORMAL LOW (ref 36.0–46.0)
Hemoglobin: 8.2 g/dL — ABNORMAL LOW (ref 12.0–15.0)
Lymphocytes Relative: 37 % (ref 12–46)
Lymphs Abs: 1.9 10*3/uL (ref 0.7–4.0)
MCH: 18.1 pg — ABNORMAL LOW (ref 26.0–34.0)
MCHC: 29.9 g/dL — AB (ref 30.0–36.0)
MCV: 60.4 fL — AB (ref 78.0–100.0)
Monocytes Absolute: 0.3 10*3/uL (ref 0.1–1.0)
Monocytes Relative: 6 % (ref 3–12)
Neutro Abs: 2.6 10*3/uL (ref 1.7–7.7)
Neutrophils Relative %: 51 % (ref 43–77)
Platelets: 503 10*3/uL — ABNORMAL HIGH (ref 150–400)
RBC: 4.54 MIL/uL (ref 3.87–5.11)
RDW: 17.8 % — AB (ref 11.5–15.5)
WBC: 5.1 10*3/uL (ref 4.0–10.5)

## 2013-10-23 LAB — COMPLETE METABOLIC PANEL WITH GFR
ALT: 9 U/L (ref 0–35)
AST: 14 U/L (ref 0–37)
Albumin: 3.8 g/dL (ref 3.5–5.2)
Alkaline Phosphatase: 101 U/L (ref 39–117)
BILIRUBIN TOTAL: 0.2 mg/dL (ref 0.2–1.2)
BUN: 10 mg/dL (ref 6–23)
CO2: 26 meq/L (ref 19–32)
CREATININE: 0.81 mg/dL (ref 0.50–1.10)
Calcium: 8.7 mg/dL (ref 8.4–10.5)
Chloride: 104 mEq/L (ref 96–112)
GFR, Est Non African American: 89 mL/min
Glucose, Bld: 162 mg/dL — ABNORMAL HIGH (ref 70–99)
Potassium: 4.2 mEq/L (ref 3.5–5.3)
Sodium: 136 mEq/L (ref 135–145)
Total Protein: 7.4 g/dL (ref 6.0–8.3)

## 2013-10-23 LAB — LIPID PANEL
CHOL/HDL RATIO: 4.5 ratio
CHOLESTEROL: 162 mg/dL (ref 0–200)
HDL: 36 mg/dL — ABNORMAL LOW (ref >39)
LDL Cholesterol: 91 mg/dL (ref 0–99)
Triglycerides: 175 mg/dL — ABNORMAL HIGH (ref <150)
VLDL: 35 mg/dL (ref 0–40)

## 2013-10-24 LAB — RPR

## 2013-10-24 LAB — HIV-1 RNA QUANT-NO REFLEX-BLD: HIV 1 RNA Quant: <20 copies/mL (ref <20)

## 2013-10-25 LAB — T-HELPER CELL (CD4) - (RCID CLINIC ONLY)
CD4 T CELL HELPER: 27 % — AB (ref 33–55)
CD4 T Cell Abs: 540 /uL (ref 400–2700)

## 2013-10-29 ENCOUNTER — Encounter (HOSPITAL_COMMUNITY): Payer: Self-pay | Admitting: Emergency Medicine

## 2013-10-29 DIAGNOSIS — Z79899 Other long term (current) drug therapy: Secondary | ICD-10-CM | POA: Insufficient documentation

## 2013-10-29 DIAGNOSIS — Z8742 Personal history of other diseases of the female genital tract: Secondary | ICD-10-CM | POA: Insufficient documentation

## 2013-10-29 DIAGNOSIS — M545 Low back pain, unspecified: Secondary | ICD-10-CM | POA: Insufficient documentation

## 2013-10-29 DIAGNOSIS — Z87828 Personal history of other (healed) physical injury and trauma: Secondary | ICD-10-CM | POA: Diagnosis not present

## 2013-10-29 DIAGNOSIS — Z9851 Tubal ligation status: Secondary | ICD-10-CM | POA: Insufficient documentation

## 2013-10-29 DIAGNOSIS — E669 Obesity, unspecified: Secondary | ICD-10-CM | POA: Insufficient documentation

## 2013-10-29 DIAGNOSIS — M1710 Unilateral primary osteoarthritis, unspecified knee: Secondary | ICD-10-CM | POA: Insufficient documentation

## 2013-10-29 DIAGNOSIS — R1032 Left lower quadrant pain: Secondary | ICD-10-CM | POA: Insufficient documentation

## 2013-10-29 DIAGNOSIS — Z9889 Other specified postprocedural states: Secondary | ICD-10-CM | POA: Diagnosis not present

## 2013-10-29 DIAGNOSIS — Z872 Personal history of diseases of the skin and subcutaneous tissue: Secondary | ICD-10-CM | POA: Diagnosis not present

## 2013-10-29 DIAGNOSIS — Z8679 Personal history of other diseases of the circulatory system: Secondary | ICD-10-CM | POA: Diagnosis not present

## 2013-10-29 DIAGNOSIS — Z8661 Personal history of infections of the central nervous system: Secondary | ICD-10-CM | POA: Insufficient documentation

## 2013-10-29 DIAGNOSIS — Z8709 Personal history of other diseases of the respiratory system: Secondary | ICD-10-CM | POA: Diagnosis not present

## 2013-10-29 DIAGNOSIS — R1012 Left upper quadrant pain: Secondary | ICD-10-CM | POA: Diagnosis not present

## 2013-10-29 DIAGNOSIS — Z21 Asymptomatic human immunodeficiency virus [HIV] infection status: Secondary | ICD-10-CM | POA: Insufficient documentation

## 2013-10-29 DIAGNOSIS — R110 Nausea: Secondary | ICD-10-CM | POA: Diagnosis not present

## 2013-10-29 DIAGNOSIS — R51 Headache: Secondary | ICD-10-CM | POA: Diagnosis not present

## 2013-10-29 DIAGNOSIS — R1033 Periumbilical pain: Secondary | ICD-10-CM | POA: Diagnosis not present

## 2013-10-29 DIAGNOSIS — E119 Type 2 diabetes mellitus without complications: Secondary | ICD-10-CM | POA: Diagnosis not present

## 2013-10-29 DIAGNOSIS — Z862 Personal history of diseases of the blood and blood-forming organs and certain disorders involving the immune mechanism: Secondary | ICD-10-CM | POA: Insufficient documentation

## 2013-10-29 DIAGNOSIS — IMO0002 Reserved for concepts with insufficient information to code with codable children: Secondary | ICD-10-CM

## 2013-10-29 LAB — COMPREHENSIVE METABOLIC PANEL
ALT: 10 U/L (ref 0–35)
AST: 16 U/L (ref 0–37)
Albumin: 3.9 g/dL (ref 3.5–5.2)
Alkaline Phosphatase: 101 U/L (ref 39–117)
BUN: 10 mg/dL (ref 6–23)
CO2: 27 mEq/L (ref 19–32)
Calcium: 9.5 mg/dL (ref 8.4–10.5)
Chloride: 99 mEq/L (ref 96–112)
Creatinine, Ser: 0.8 mg/dL (ref 0.50–1.10)
GFR calc Af Amer: >90 mL/min (ref >90)
GFR calc non Af Amer: 88 mL/min — ABNORMAL LOW (ref >90)
Glucose, Bld: 113 mg/dL — ABNORMAL HIGH (ref 70–99)
Potassium: 3.5 mEq/L — ABNORMAL LOW (ref 3.7–5.3)
Sodium: 138 mEq/L (ref 137–147)
Total Bilirubin: 0.4 mg/dL (ref 0.3–1.2)
Total Protein: 8.2 g/dL (ref 6.0–8.3)

## 2013-10-29 LAB — CBC WITH DIFFERENTIAL/PLATELET
Basophils Absolute: 0 10*3/uL (ref 0.0–0.1)
Basophils Relative: 0 % (ref 0–1)
Eosinophils Absolute: 0.2 10*3/uL (ref 0.0–0.7)
Eosinophils Relative: 4 % (ref 0–5)
HCT: 27 % — ABNORMAL LOW (ref 36.0–46.0)
Hemoglobin: 7.9 g/dL — ABNORMAL LOW (ref 12.0–15.0)
Lymphocytes Relative: 35 % (ref 12–46)
Lymphs Abs: 2.1 10*3/uL (ref 0.7–4.0)
MCH: 18.4 pg — ABNORMAL LOW (ref 26.0–34.0)
MCHC: 29.3 g/dL — ABNORMAL LOW (ref 30.0–36.0)
MCV: 62.8 fL — ABNORMAL LOW (ref 78.0–100.0)
Monocytes Absolute: 0.7 10*3/uL (ref 0.1–1.0)
Monocytes Relative: 12 % (ref 3–12)
Neutro Abs: 3.1 10*3/uL (ref 1.7–7.7)
Neutrophils Relative %: 49 % (ref 43–77)
Platelets: 455 10*3/uL — ABNORMAL HIGH (ref 150–400)
RBC: 4.3 MIL/uL (ref 3.87–5.11)
RDW: 17.3 % — ABNORMAL HIGH (ref 11.5–15.5)
Smear Review: INCREASED
WBC: 6.1 10*3/uL (ref 4.0–10.5)

## 2013-10-29 LAB — URINALYSIS, ROUTINE W REFLEX MICROSCOPIC
Bilirubin Urine: NEGATIVE
Glucose, UA: NEGATIVE mg/dL
Ketones, ur: NEGATIVE mg/dL
Leukocytes, UA: NEGATIVE
Nitrite: NEGATIVE
Protein, ur: NEGATIVE mg/dL
Specific Gravity, Urine: 1.023 (ref 1.005–1.030)
Urobilinogen, UA: 1 mg/dL (ref 0.0–1.0)
pH: 7 (ref 5.0–8.0)

## 2013-10-29 LAB — URINE MICROSCOPIC-ADD ON

## 2013-10-29 LAB — LIPASE, BLOOD: Lipase: 38 U/L (ref 11–59)

## 2013-10-29 MED ORDER — ONDANSETRON 4 MG PO TBDP
8.0000 mg | ORAL_TABLET | Freq: Once | ORAL | Status: AC
  Administered 2013-10-29: 8 mg via ORAL
  Filled 2013-10-29: qty 2

## 2013-10-29 NOTE — ED Notes (Signed)
Pt states she is having bad pain around her naval, into her lower back with diarrhea

## 2013-10-30 ENCOUNTER — Emergency Department (HOSPITAL_COMMUNITY)
Admission: EM | Admit: 2013-10-30 | Discharge: 2013-10-30 | Disposition: A | Discharge status: Home / Self Care | Payer: Medicare Other | Attending: Emergency Medicine | Admitting: Emergency Medicine

## 2013-10-30 ENCOUNTER — Emergency Department (HOSPITAL_COMMUNITY): Payer: Medicare Other

## 2013-10-30 ENCOUNTER — Encounter (HOSPITAL_COMMUNITY): Payer: Self-pay | Admitting: Radiology

## 2013-10-30 DIAGNOSIS — R109 Unspecified abdominal pain: Secondary | ICD-10-CM

## 2013-10-30 DIAGNOSIS — L259 Unspecified contact dermatitis, unspecified cause: Secondary | ICD-10-CM | POA: Diagnosis not present

## 2013-10-30 DIAGNOSIS — R51 Headache: Secondary | ICD-10-CM | POA: Diagnosis not present

## 2013-10-30 DIAGNOSIS — K420 Umbilical hernia with obstruction, without gangrene: Secondary | ICD-10-CM | POA: Diagnosis not present

## 2013-10-30 DIAGNOSIS — B20 Human immunodeficiency virus [HIV] disease: Secondary | ICD-10-CM | POA: Diagnosis not present

## 2013-10-30 DIAGNOSIS — D649 Anemia, unspecified: Secondary | ICD-10-CM | POA: Diagnosis not present

## 2013-10-30 DIAGNOSIS — R1033 Periumbilical pain: Secondary | ICD-10-CM | POA: Diagnosis not present

## 2013-10-30 DIAGNOSIS — E1169 Type 2 diabetes mellitus with other specified complication: Secondary | ICD-10-CM | POA: Diagnosis not present

## 2013-10-30 MED ORDER — IOHEXOL 300 MG/ML  SOLN
20.0000 mL | INTRAMUSCULAR | Status: DC
  Administered 2013-10-30: 20 mL via ORAL

## 2013-10-30 MED ORDER — IOHEXOL 300 MG/ML  SOLN
100.0000 mL | Freq: Once | INTRAMUSCULAR | Status: AC | PRN
  Administered 2013-10-30: 100 mL via INTRAVENOUS

## 2013-10-30 NOTE — ED Notes (Signed)
No answer when called to be taken to a room. 

## 2013-10-30 NOTE — ED Notes (Signed)
Dr Miller at bedside. 

## 2013-10-30 NOTE — ED Notes (Signed)
Pt returned from CT °

## 2013-10-30 NOTE — ED Notes (Signed)
Pt. C/o lower left sided abdominal pain for years. Has seen gastroenterologist x2. Reports hx of hemorrhoids and flare ups. Denies blood with BM or urine.

## 2013-10-30 NOTE — ED Notes (Signed)
Patient to registration  Patient was sleeping and did not hear her name called

## 2013-10-30 NOTE — Discharge Instructions (Signed)
Your CT scan did not show a cause of your pain -   Please call your doctor for a followup appointment within 24-48 hours. When you talk to your doctor please let them know that you were seen in the emergency department and have them acquire all of your records so that they can discuss the findings with you and formulate a treatment plan to fully care for your new and ongoing problems.

## 2013-10-30 NOTE — ED Provider Notes (Signed)
CSN: [id number]     Arrival date & time 10/29/13  1943 History   First MD Initiated Contact with Patient 10/30/13 0205     Chief Complaint  Patient presents with  . Abdominal Pain  . Nausea  . Headache     (Consider location/radiation/quality/duration/timing/severity/associated sxs/prior Treatment) HPI Comments: The patient is a 43 year old female with a history of HIV, diabetes as well as hemorrhoids. She presents with a complaint of abdominal pain which she states has been present for years but has become worse over the last couple of days. This is located in the left side of her abdomen including the left upper and left lower quadrant as well as a feeling of pain in her left lower back. There has been no dysuria, no hematuria, no fevers or chills and eating does not make his pain worse. She states that she does have increased pain when urinating in the abdomen. She has not had any imaging except for colonoscopy performed by gastroenterology, told that she had no polyps and no other problems internally. She does note that she has intermittent severe hemorrhoids which do believe but recently they have not been bleeding. She has not had any imaging of her abdomen per her report.  Patient is a 43 y.o. female presenting with abdominal pain and headaches. The history is provided by the patient.  Abdominal Pain Headache Associated symptoms: abdominal pain     Past Medical History  Diagnosis Date  . HIV disease   . Diabetes mellitus   . Hemorrhoids   . 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 Chronic folliculitis   . Iron deficiency anemia due to chronic blood loss   . Obesity   . Pneumothorax, traumatic   . Arthritis     knees  . Irregular menstrual cycle   . Migraines    Past Surgical History  Procedure Laterality Date  . Hysteroscopy w/d&c  Removal of Endometrial Polyp    Dr. Raphael Gibney 2011  . Chest tube insertion    . Tubal ligation     Family History  Problem Relation Age of Onset   . Diabetes Mother   . Heart failure Father   . Diabetes Father   . Heart failure Brother   . Colon cancer      neg hx.   History  Substance Use Topics  . Smoking status: Never Smoker   . Smokeless tobacco: Never Used  . Alcohol Use: No   OB History   Grav Para Term Preterm Abortions TAB SAB Ect Mult Living   8 4 4  4  4   4      Review of Systems  Gastrointestinal: Positive for abdominal pain.  Neurological: Positive for headaches.  All other systems reviewed and are negative.     Allergies  Cortisone; Hydrocodone-acetaminophen; and Morphine  Home Medications   Prior to Admission medications   Medication Sig Start Date End Date Taking? Authorizing Provider  emtricitabine-tenofovir (TRUVADA) 200-300 MG per tablet Take 1 tablet by mouth daily.   Yes Historical Provider, MD  lopinavir-ritonavir (KALETRA) 200-50 MG per tablet Take 2 tablets by mouth 2 (two) times daily.   Yes Historical Provider, MD  metFORMIN (GLUCOPHAGE) 500 MG tablet Take 1 tablet by mouth 2 (two) times daily. 06/26/13  Yes Historical Provider, MD  NOVOFINE 32G X 6 MM MISC  11/21/12  Yes Historical Provider, MD  ondansetron (ZOFRAN ODT) 8 MG disintegrating tablet Take 1 tablet (8 mg total) by mouth every 8 (eight) hours  as needed for nausea or vomiting. 07/27/13  Yes Kalman Drape, MD  traMADol (ULTRAM) 50 MG tablet Take 50 mg by mouth every 6 (six) hours as needed.   Yes Historical Provider, MD  traZODone (DESYREL) 50 MG tablet Take 50 mg by mouth at bedtime.   Yes Historical Provider, MD  VICTOZA 18 MG/3ML SOPN Inject 1.8 mLs into the skin daily.  11/27/12  Yes Historical Provider, MD   BP 155/78  Pulse 91  Temp(Src) 98.6 F (37 C) (Oral)  Resp 16  Ht 5\' 9"  (1.753 m)  Wt 225 lb 9.6 oz (102.331 kg)  BMI 33.30 kg/m2  SpO2 97% Physical Exam  Nursing note and vitals reviewed. Constitutional: She appears well-developed and well-nourished. No distress.  HENT:  Head: Normocephalic and atraumatic.   Mouth/Throat: Oropharynx is clear and moist. No oropharyngeal exudate.  Eyes: Conjunctivae and EOM are normal. Pupils are equal, round, and reactive to light. Right eye exhibits no discharge. Left eye exhibits no discharge. No scleral icterus.  Neck: Normal range of motion. Neck supple. No JVD present. No thyromegaly present.  Cardiovascular: Normal rate, regular rhythm, normal heart sounds and intact distal pulses.  Exam reveals no gallop and no friction rub.   No murmur heard. Pulmonary/Chest: Effort normal and breath sounds normal. No respiratory distress. She has no wheezes. She has no rales.  Abdominal: Soft. Bowel sounds are normal. She exhibits no distension and no mass. There is tenderness (left lower quadrant and left upper quadrant tenderness, no guarding, no masses.).  No CVA tenderness  Genitourinary:  Chaperone present for rectal exam, no external hemorrhoids or fissures seen  Musculoskeletal: Normal range of motion. She exhibits no edema and no tenderness.  Lymphadenopathy:    She has no cervical adenopathy.  Neurological: She is alert. Coordination normal.  Skin: Skin is warm and dry. No rash noted. No erythema.  Psychiatric: She has a normal mood and affect. Her behavior is normal.    ED Course  Procedures (including critical care time) Labs Review Labs Reviewed  CBC WITH DIFFERENTIAL - Abnormal; Notable for the following:    Hemoglobin 7.9 (*)    HCT 27.0 (*)    MCV 62.8 (*)    MCH 18.4 (*)    MCHC 29.3 (*)    RDW 17.3 (*)    Platelets 455 (*)    All other components within normal limits  COMPREHENSIVE METABOLIC PANEL - Abnormal; Notable for the following:    Potassium 3.5 (*)    Glucose, Bld 113 (*)    GFR calc non Af Amer 88 (*)    All other components within normal limits  URINALYSIS, ROUTINE W REFLEX MICROSCOPIC - Abnormal; Notable for the following:    APPearance CLOUDY (*)    Hgb urine dipstick TRACE (*)    All other components within normal limits   URINE MICROSCOPIC-ADD ON - Abnormal; Notable for the following:    Squamous Epithelial / LPF MANY (*)    All other components within normal limits  LIPASE, BLOOD    Imaging Review Ct Abdomen Pelvis W Contrast  10/30/2013   CLINICAL DATA:  Periumbilical abdominal pain, extending into the lower back. Diarrhea.  EXAM: CT ABDOMEN AND PELVIS WITH CONTRAST  TECHNIQUE: Multidetector CT imaging of the abdomen and pelvis was performed using the standard protocol following bolus administration of intravenous contrast.  CONTRAST:  175mL OMNIPAQUE IOHEXOL 300 MG/ML  SOLN  COMPARISON:  CT of the abdomen and pelvis performed 07/27/2013  FINDINGS: Minimal  left basilar atelectasis or scarring is noted.  The liver and spleen are unremarkable in appearance. The gallbladder is relatively decompressed and within normal limits. The pancreas and adrenal glands are unremarkable.  The kidneys are unremarkable in appearance. There is no evidence of hydronephrosis. No renal or ureteral stones are seen. No perinephric stranding is appreciated.  No free fluid is identified. The small bowel is unremarkable in appearance. The stomach is within normal limits. No acute vascular abnormalities are seen. A prominent calcification is seen along the right ovarian vein.  The appendix is normal in caliber and contains air, without evidence for appendicitis. The colon is unremarkable in appearance.  The bladder is mildly distended and grossly unremarkable. The uterus is unremarkable in appearance. The ovaries are relatively symmetric; no suspicious adnexal masses are seen. Bilateral tubal ligation clips are noted. No inguinal lymphadenopathy is seen.  No acute osseous abnormalities are identified.  IMPRESSION: No acute abnormality seen within the abdomen or pelvis.   Electronically Signed   By: Garald Balding M.D.   On: 10/30/2013 03:20      MDM   Final diagnoses:  Abdominal pain    The patient has ongoing lower abdominal pain.  Urinalysis does not reveal infection, she has chronic anemia, normal electrolytes, normal renal function. Vital signs are normal, increasing pain in her abdomen is concerning for things such as tumor or mass which could be causing hemorrhoids as well. CT scan pending.  CT without acute findings - no diverticulitis, no tumors,  Pt informed - stable for dc.  Johnna Acosta, MD 10/30/13 [phone]

## 2013-10-31 ENCOUNTER — Encounter: Payer: Self-pay | Admitting: Infectious Diseases

## 2013-10-31 ENCOUNTER — Encounter (INDEPENDENT_AMBULATORY_CARE_PROVIDER_SITE_OTHER): Payer: Self-pay | Admitting: General Surgery

## 2013-10-31 ENCOUNTER — Ambulatory Visit (INDEPENDENT_AMBULATORY_CARE_PROVIDER_SITE_OTHER): Payer: Medicare Other | Admitting: Infectious Diseases

## 2013-10-31 VITALS — BP 144/86 | HR 92 | Temp 98.2°F | Ht 69.0 in | Wt 225.0 lb

## 2013-10-31 DIAGNOSIS — IMO0002 Reserved for concepts with insufficient information to code with codable children: Secondary | ICD-10-CM | POA: Diagnosis not present

## 2013-10-31 DIAGNOSIS — K649 Unspecified hemorrhoids: Secondary | ICD-10-CM | POA: Diagnosis not present

## 2013-10-31 DIAGNOSIS — N920 Excessive and frequent menstruation with regular cycle: Secondary | ICD-10-CM | POA: Diagnosis not present

## 2013-10-31 DIAGNOSIS — E118 Type 2 diabetes mellitus with unspecified complications: Secondary | ICD-10-CM

## 2013-10-31 DIAGNOSIS — E1165 Type 2 diabetes mellitus with hyperglycemia: Secondary | ICD-10-CM

## 2013-10-31 DIAGNOSIS — L678 Other hair color and hair shaft abnormalities: Secondary | ICD-10-CM | POA: Diagnosis not present

## 2013-10-31 DIAGNOSIS — L738 Other specified follicular disorders: Secondary | ICD-10-CM

## 2013-10-31 DIAGNOSIS — B20 Human immunodeficiency virus [HIV] disease: Secondary | ICD-10-CM | POA: Diagnosis not present

## 2013-10-31 NOTE — Assessment & Plan Note (Signed)
She is doing very well. She is given condoms today. Her vax are up to date. Need to check her Hep B S Ab to see if she responded. rtc 6 months.

## 2013-10-31 NOTE — Assessment & Plan Note (Signed)
Will refer her to GI

## 2013-10-31 NOTE — Assessment & Plan Note (Signed)
Will have her seen by [name]

## 2013-10-31 NOTE — Progress Notes (Signed)
   Subjective:    Patient ID: Brenda [name], female    DOB: Feb 12, 1971, 43 y.o.   MRN: [id number]  HPI 43 yo F with hx of HIV+ (1998) and DM2 (gestational DM @ 43 yo). Had cone Bx 2013 and was told it was "abnormal but nothing to worry about".  Taking KLT/TRV. No other rx.  Is taking insulin for her DM now, sees Dr Criss Rosales.  Was seen in ED yesterday for abd pain- CT negative.  Still having headaches. Has been taking tramadol, phenergan. Having more frequently. No nasal d/c. Taking advil cold and sinus.  Needs repeat PAP.  Has chronic skin ulcers/lesions/foliculitis.   HIV 1 RNA Quant (copies/mL)  Date Value  10/23/2013 <20   11/20/2012 95*  11/22/2011 149*     CD4 T Cell Abs (/uL)  Date Value  10/23/2013 540   11/20/2012 450   11/22/2011 600     Review of Systems  Constitutional: Negative for appetite change and unexpected weight change.  Gastrointestinal: Positive for anal bleeding. Negative for nausea and diarrhea.  Genitourinary: Positive for menstrual problem. Negative for difficulty urinating.  Skin: Positive for rash.  heavy menses. Hemerrrhoids- had colonoscopy (-) at [facility].      Objective:   Physical Exam  Constitutional: She appears well-developed and well-nourished.  HENT:  Mouth/Throat: No oropharyngeal exudate.  Eyes: EOM are normal. Pupils are equal, round, and reactive to light.  Neck: Neck supple.  Cardiovascular: Normal rate, regular rhythm and normal heart sounds.   Pulmonary/Chest: Effort normal and breath sounds normal.  Abdominal: Soft. Bowel sounds are normal. She exhibits no distension.  Musculoskeletal: She exhibits no edema.  Lymphadenopathy:    She has no cervical adenopathy.          Assessment & Plan:

## 2013-10-31 NOTE — Assessment & Plan Note (Signed)
States her sugars have been in the 100s. Appreciate Dr Criss Rosales partnering with [place].

## 2013-10-31 NOTE — Assessment & Plan Note (Signed)
Will refer her to GYN.  

## 2013-11-01 ENCOUNTER — Ambulatory Visit (INDEPENDENT_AMBULATORY_CARE_PROVIDER_SITE_OTHER): Payer: Medicare Other | Admitting: General Surgery

## 2013-11-01 ENCOUNTER — Encounter (INDEPENDENT_AMBULATORY_CARE_PROVIDER_SITE_OTHER): Payer: Self-pay | Admitting: General Surgery

## 2013-11-01 VITALS — BP 128/80 | HR 76 | Temp 97.5°F | Ht 69.0 in | Wt 224.0 lb

## 2013-11-01 DIAGNOSIS — K649 Unspecified hemorrhoids: Secondary | ICD-10-CM | POA: Diagnosis not present

## 2013-11-01 DIAGNOSIS — K429 Umbilical hernia without obstruction or gangrene: Secondary | ICD-10-CM | POA: Diagnosis not present

## 2013-11-01 NOTE — Patient Instructions (Signed)
We will bring you back after your GYN appointment to discuss umbilical hernia surgery and see how your bowels are doing  GETTING TO [place]. Irregular bowel habits such as constipation and diarrhea can lead to many problems over time.  Having one soft bowel movement a day is the most important way to prevent further problems.  The anorectal canal is designed to handle stretching and feces to safely manage our ability to get rid of solid waste (feces, poop, stool) out of our body.  BUT, hard constipated stools can act like ripping concrete bricks and diarrhea can be a burning fire to this very sensitive area of our body, causing inflamed hemorrhoids, anal fissures, increasing risk is perirectal abscesses, abdominal pain/bloating, an making irritable bowel worse.     The goal: ONE SOFT BOWEL MOVEMENT A DAY!  To have soft, regular bowel movements:    Drink at least 8 tall glasses of water a day.     Take plenty of fiber.  Fiber is the undigested part of plant food that passes into the colon, acting s "natures broom" to encourage bowel motility and movement.  Fiber can absorb and hold large amounts of water. This results in a larger, bulkier stool, which is soft and easier to pass. Work gradually over several weeks up to 6 servings a day of fiber (25g a day even more if needed) in the form of: o Vegetables -- Root (potatoes, carrots, turnips), leafy green (lettuce, salad greens, celery, spinach), or cooked high residue (cabbage, broccoli, etc) o Fruit -- Fresh (unpeeled skin & pulp), Dried (prunes, apricots, cherries, etc ),  or stewed ( applesauce)  o Whole grain breads, pasta, etc (whole wheat)  o Bran cereals    Bulking Agents -- This type of water-retaining fiber generally is easily obtained each day by one of the following:  o Psyllium bran -- The psyllium plant is remarkable because its ground seeds can retain so much water. This product is available as Metamucil, Konsyl, Effersyllium, Per  Diem Fiber, or the less expensive generic preparation in drug and health food stores. Although labeled a laxative, it really is not a laxative.  o Methylcellulose -- This is another fiber derived from wood which also retains water. It is available as Citrucel. o Benefiber - odorless and tasteless o Polyethylene Glycol - and "artificial" fiber commonly called Miralax or Glycolax.  It is helpful for people with gassy or bloated feelings with regular fiber o Flax Seed - a less gassy fiber than psyllium   No reading or other relaxing activity while on the toilet. If bowel movements take longer than 5 minutes, you are too constipated   AVOID CONSTIPATION.  High fiber and water intake usually takes care of this.  Sometimes a laxative is needed to stimulate more frequent bowel movements, but    Laxatives are not a good long-term solution as it can wear the colon out. o Osmotics (Milk of Magnesia, Fleets phosphosoda, Magnesium citrate, MiraLax, GoLytely) are safer than  o Stimulants (Senokot, Castor Oil, Dulcolax, Ex Lax)    o Do not take laxatives for more than 7days in a row.    IF SEVERELY CONSTIPATED, try a Bowel Retraining Program: o Do not use laxatives.  o Eat a diet high in roughage, such as bran cereals and leafy vegetables.  o Drink six (6) ounces of prune or apricot juice each morning.  o Eat two (2) large servings of stewed fruit each day.  o Take one (  1) heaping tablespoon of a psyllium-based bulking agent twice a day. Use sugar-free sweetener when possible to avoid excessive calories.  o Eat a normal breakfast.  o Set aside 15 minutes after breakfast to sit on the toilet, but do not strain to have a bowel movement.  o If you do not have a bowel movement by the third day, use an enema and repeat the above steps.

## 2013-11-01 NOTE — Progress Notes (Signed)
Patient ID: Brenda [name], female   DOB: 1971/02/20, 43 y.o.   MRN: [id number]  Chief Complaint  Patient presents with  . Umbilical Hernia    HPI Brenda [name] is a 43 y.o. female.   HPI 43 year old [place] American female referred by Dr. Criss Rosales for evaluation of abdominal pain. The patient initially has several complaints. She complains of hemorrhoidal problems as well as. Umbilical pain radiating to her left side. She states that she's had left sided abdominal pain for quite a while. She states that her navel will hurt after eating. She's had a prior tubal ligation. She's been to the emergency room where CT scan was normal. She cannot really attribute any particular event that that'll exacerbate the discomfort. She states that sometimes her navel will feel like a rock. She also complains of intermittent hemorrhoidal problems. She has a bowel movement everyday. She sits on the commode for 10-15 minutes at a time. She does not straining. She doesn't drink much water. She does not take any supplement fiber. She denies any pain with defecation. She states that she had a colonoscopy about a year ago with Velora Heckler which was unremarkable Past Medical History  Diagnosis Date  . HIV disease   . Diabetes mellitus   . Hemorrhoids   . 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 Chronic folliculitis   . Iron deficiency anemia due to chronic blood loss   . Obesity   . Pneumothorax, traumatic   . Arthritis     knees  . Irregular menstrual cycle   . Migraines     Past Surgical History  Procedure Laterality Date  . Hysteroscopy w/d&c  Removal of Endometrial Polyp    Dr. Raphael Gibney 2011  . Chest tube insertion    . Tubal ligation      Family History  Problem Relation Age of Onset  . Diabetes Mother   . Heart failure Father   . Diabetes Father   . Heart failure Brother   . Colon cancer      neg hx.    Social History History  Substance Use Topics  . Smoking status: Never Smoker   . Smokeless  tobacco: Never Used  . Alcohol Use: No    Allergies  Allergen Reactions  . Cortisone     REACTION: "nerves"  . Hydrocodone-Acetaminophen     REACTION: "nerves"  . Morphine     REACTION: "nerves"    Current Outpatient Prescriptions  Medication Sig Dispense Refill  . emtricitabine-tenofovir (TRUVADA) 200-300 MG per tablet Take 1 tablet by mouth daily.      Marland Kitchen lopinavir-ritonavir (KALETRA) 200-50 MG per tablet Take 2 tablets by mouth 2 (two) times daily.      . metFORMIN (GLUCOPHAGE) 500 MG tablet Take 1 tablet by mouth 2 (two) times daily.      Marland Kitchen NOVOFINE 32G X 6 MM MISC       . ondansetron (ZOFRAN ODT) 8 MG disintegrating tablet Take 1 tablet (8 mg total) by mouth every 8 (eight) hours as needed for nausea or vomiting.  20 tablet  0  . temazepam (RESTORIL) 30 MG capsule       . traMADol (ULTRAM) 50 MG tablet Take 50 mg by mouth every 6 (six) hours as needed.      Marland Kitchen VICTOZA 18 MG/3ML SOPN Inject 1.8 mLs into the skin daily.        No current facility-administered medications for this visit.    Review of Systems Review of Systems  Constitutional: Negative for fever, activity change, appetite change and unexpected weight change.  HENT: Negative for nosebleeds and trouble swallowing.   Eyes: Negative for photophobia and visual disturbance.  Respiratory: Negative for chest tightness and shortness of breath.   Cardiovascular: Negative for chest pain and leg swelling.       Denies CP, SOB, orthopnea, PND, DOE  Gastrointestinal: Positive for nausea, diarrhea, anal bleeding and rectal pain.  Genitourinary: Negative for dysuria and difficulty urinating.       Heavy periods  Musculoskeletal: Negative for arthralgias.  Skin: Negative for pallor and rash.  Neurological: Negative for dizziness, seizures, facial asymmetry and numbness.          Hematological: Negative for adenopathy. Does not bruise/bleed easily.  Psychiatric/Behavioral: Negative for behavioral problems and agitation.     Blood pressure 128/80, pulse 76, temperature 97.5 F (36.4 C), height 5\' 9"  (1.753 m), weight 224 lb (101.606 kg), last menstrual period 10/20/2013.  Physical Exam Physical Exam  Vitals reviewed. Constitutional: She is oriented to person, place, and time. She appears well-developed and well-nourished. No distress.  obese  HENT:  Head: Normocephalic and atraumatic.  Right Ear: External ear normal.  Left Ear: External ear normal.  Eyes: Conjunctivae are normal. No scleral icterus.  Neck: Normal range of motion. Neck supple. No tracheal deviation present. No thyromegaly present.  Cardiovascular: Normal rate and normal heart sounds.   Pulmonary/Chest: Effort normal and breath sounds normal. No stridor. No respiratory distress. She has no wheezes.  Abdominal: Soft. She exhibits no distension. There is no rebound and no guarding.    Small vertical infraumbilical incision. Palpable fascial defect about 2 cm. Somewhat tender. No rebound or guarding. Nothing really protruding from hernia site  Musculoskeletal: She exhibits no edema and no tenderness.  Lymphadenopathy:    She has no cervical adenopathy.  Neurological: She is alert and oriented to person, place, and time. She exhibits normal muscle tone.  Skin: Skin is warm and dry. No rash noted. She is not diaphoretic. No erythema.  Psychiatric: She has a normal mood and affect. Her behavior is normal. Judgment and thought content normal.    Data Reviewed Dr Fransico Setters clinic note ED note5/26 CT and labs  Assessment    Small incisional umbilical hernia HIV disease Diabetes mellitus type 2 Hemorrhoids     Plan    She states the majority of her discomfort tends to be on her left side. I explained that I doubt that her small umbilical hernia as a source of her left-sided abdominal pain. On CT imaging there is really nothing protruding through her hernia.   We spent time discussing hemorrhoidal disease. She was given Medical sales representative. We discussed proper bowel habits. Advised her that she should not sit on the commode for longer than 5-10 minutes at a time. We discussed the importance of drinking 6-8 glasses of water a day. I also recommended taking supplemental fiber. We discussed importance of starting with low-dose supplemental fiber and then gradually titrating up to about 25 g a day in order to avoid bloating and cramping. I explained that she would have to do this on a daily basis for several weeks before she sees any significant improvement with respect to her hemorrhoids.  With respect to her incisional umbilical hernia, I recommend waiting on definitive repair until she has been evaluated by her OB/GYN. She states that she has an upcoming appointment because of her heavy periods. I explained that if she needed any intervention with  respect to her uterus that a hernia repair with mesh could make that a little bit challenging. I recommended her following up with her OB/GYN as planned and we will bring her back in about 4-6 weeks and see how she is doing with her fiber supplementation and improved bowel habits with respect to her hemorrhoids as well as discuss potential umbilical hernia repair  Leighton Ruff. Redmond Pulling, MD, FACS General, Bariatric, & Minimally Invasive Surgery [facility]        Gayland Curry 11/01/2013, 7:53 PM

## 2013-11-06 DIAGNOSIS — IMO0002 Reserved for concepts with insufficient information to code with codable children: Secondary | ICD-10-CM | POA: Diagnosis not present

## 2013-11-06 DIAGNOSIS — Z124 Encounter for screening for malignant neoplasm of cervix: Secondary | ICD-10-CM | POA: Diagnosis not present

## 2013-11-06 DIAGNOSIS — K625 Hemorrhage of anus and rectum: Secondary | ICD-10-CM | POA: Diagnosis not present

## 2013-11-06 DIAGNOSIS — R109 Unspecified abdominal pain: Secondary | ICD-10-CM | POA: Diagnosis not present

## 2013-11-15 ENCOUNTER — Other Ambulatory Visit: Payer: Self-pay | Admitting: Obstetrics and Gynecology

## 2013-11-15 DIAGNOSIS — R109 Unspecified abdominal pain: Secondary | ICD-10-CM | POA: Diagnosis not present

## 2013-11-15 DIAGNOSIS — N920 Excessive and frequent menstruation with regular cycle: Secondary | ICD-10-CM | POA: Diagnosis not present

## 2013-11-15 DIAGNOSIS — D649 Anemia, unspecified: Secondary | ICD-10-CM | POA: Diagnosis not present

## 2013-11-27 DIAGNOSIS — N920 Excessive and frequent menstruation with regular cycle: Secondary | ICD-10-CM | POA: Diagnosis not present

## 2013-11-27 DIAGNOSIS — R079 Chest pain, unspecified: Secondary | ICD-10-CM | POA: Diagnosis not present

## 2013-12-05 DIAGNOSIS — M2240 Chondromalacia patellae, unspecified knee: Secondary | ICD-10-CM | POA: Diagnosis not present

## 2013-12-06 ENCOUNTER — Other Ambulatory Visit: Payer: Self-pay | Admitting: Obstetrics and Gynecology

## 2013-12-22 ENCOUNTER — Encounter (HOSPITAL_COMMUNITY): Payer: Self-pay | Admitting: Emergency Medicine

## 2013-12-22 ENCOUNTER — Emergency Department (HOSPITAL_COMMUNITY)
Admission: EM | Admit: 2013-12-22 | Discharge: 2013-12-22 | Disposition: A | Discharge status: Home / Self Care | Payer: Medicare Other | Attending: Emergency Medicine | Admitting: Emergency Medicine

## 2013-12-22 DIAGNOSIS — N760 Acute vaginitis: Secondary | ICD-10-CM | POA: Insufficient documentation

## 2013-12-22 DIAGNOSIS — A499 Bacterial infection, unspecified: Secondary | ICD-10-CM | POA: Insufficient documentation

## 2013-12-22 DIAGNOSIS — E669 Obesity, unspecified: Secondary | ICD-10-CM | POA: Diagnosis not present

## 2013-12-22 DIAGNOSIS — Z79899 Other long term (current) drug therapy: Secondary | ICD-10-CM | POA: Insufficient documentation

## 2013-12-22 DIAGNOSIS — Z8739 Personal history of other diseases of the musculoskeletal system and connective tissue: Secondary | ICD-10-CM | POA: Insufficient documentation

## 2013-12-22 DIAGNOSIS — B9689 Other specified bacterial agents as the cause of diseases classified elsewhere: Secondary | ICD-10-CM | POA: Diagnosis not present

## 2013-12-22 DIAGNOSIS — Z3202 Encounter for pregnancy test, result negative: Secondary | ICD-10-CM | POA: Insufficient documentation

## 2013-12-22 DIAGNOSIS — G43909 Migraine, unspecified, not intractable, without status migrainosus: Secondary | ICD-10-CM | POA: Insufficient documentation

## 2013-12-22 DIAGNOSIS — Z862 Personal history of diseases of the blood and blood-forming organs and certain disorders involving the immune mechanism: Secondary | ICD-10-CM | POA: Insufficient documentation

## 2013-12-22 DIAGNOSIS — N9489 Other specified conditions associated with female genital organs and menstrual cycle: Secondary | ICD-10-CM | POA: Diagnosis present

## 2013-12-22 DIAGNOSIS — E119 Type 2 diabetes mellitus without complications: Secondary | ICD-10-CM | POA: Diagnosis not present

## 2013-12-22 DIAGNOSIS — Z9889 Other specified postprocedural states: Secondary | ICD-10-CM | POA: Insufficient documentation

## 2013-12-22 DIAGNOSIS — Z21 Asymptomatic human immunodeficiency virus [HIV] infection status: Secondary | ICD-10-CM | POA: Diagnosis not present

## 2013-12-22 DIAGNOSIS — B3731 Acute candidiasis of vulva and vagina: Secondary | ICD-10-CM | POA: Insufficient documentation

## 2013-12-22 DIAGNOSIS — Z872 Personal history of diseases of the skin and subcutaneous tissue: Secondary | ICD-10-CM | POA: Insufficient documentation

## 2013-12-22 DIAGNOSIS — B373 Candidiasis of vulva and vagina: Secondary | ICD-10-CM | POA: Diagnosis not present

## 2013-12-22 DIAGNOSIS — Z8661 Personal history of infections of the central nervous system: Secondary | ICD-10-CM | POA: Diagnosis not present

## 2013-12-22 DIAGNOSIS — Z9851 Tubal ligation status: Secondary | ICD-10-CM | POA: Insufficient documentation

## 2013-12-22 LAB — URINALYSIS, ROUTINE W REFLEX MICROSCOPIC
Bilirubin Urine: NEGATIVE
Ketones, ur: NEGATIVE mg/dL
Nitrite: NEGATIVE
PH: 5.5 (ref 5.0–8.0)
Protein, ur: NEGATIVE mg/dL
SPECIFIC GRAVITY, URINE: 1.027 (ref 1.005–1.030)
Urobilinogen, UA: 1 mg/dL (ref 0.0–1.0)

## 2013-12-22 LAB — WET PREP, GENITAL: TRICH WET PREP: NONE SEEN

## 2013-12-22 LAB — URINE MICROSCOPIC-ADD ON

## 2013-12-22 LAB — PREGNANCY, URINE: Preg Test, Ur: NEGATIVE

## 2013-12-22 MED ORDER — FLUCONAZOLE 150 MG PO TABS
150.0000 mg | ORAL_TABLET | Freq: Once | ORAL | Status: AC
  Administered 2013-12-22: 150 mg via ORAL
  Filled 2013-12-22: qty 1

## 2013-12-22 MED ORDER — METRONIDAZOLE 500 MG PO TABS
2000.0000 mg | ORAL_TABLET | Freq: Once | ORAL | Status: AC
  Administered 2013-12-22: 2000 mg via ORAL
  Filled 2013-12-22: qty 4

## 2013-12-22 MED ORDER — ONDANSETRON 4 MG PO TBDP
4.0000 mg | ORAL_TABLET | Freq: Once | ORAL | Status: DC
  Filled 2013-12-22 (×2): qty 1

## 2013-12-22 MED ORDER — ONDANSETRON 4 MG PO TBDP
4.0000 mg | ORAL_TABLET | Freq: Once | ORAL | Status: AC
  Administered 2013-12-22: 4 mg via ORAL

## 2013-12-22 MED ORDER — FLUCONAZOLE 200 MG PO TABS: 200.0000 mg | ORAL_TABLET | Freq: Every day | ORAL | Status: AC

## 2013-12-22 MED ORDER — CLOTRIMAZOLE 1 % EX CREA
TOPICAL_CREAM | Freq: Two times a day (BID) | CUTANEOUS | Status: DC
  Administered 2013-12-22: 02:00:00 via TOPICAL
  Filled 2013-12-22: qty 15

## 2013-12-22 NOTE — Discharge Instructions (Signed)
Bacterial Vaginosis Bacterial vaginosis is a vaginal infection that occurs when the normal balance of bacteria in the vagina is disrupted. It results from an overgrowth of certain bacteria. This is the most common vaginal infection in women of childbearing age. Treatment is important to prevent complications, especially in pregnant women, as it can cause a premature delivery. CAUSES  Bacterial vaginosis is caused by an increase in harmful bacteria that are normally present in smaller amounts in the vagina. Several different kinds of bacteria can cause bacterial vaginosis. However, the reason that the condition develops is not fully understood. RISK FACTORS Certain activities or behaviors can put you at an increased risk of developing bacterial vaginosis, including:  Having a new sex partner or multiple sex partners.  Douching.  Using an intrauterine device (IUD) for contraception. Women do not get bacterial vaginosis from toilet seats, bedding, swimming pools, or contact with objects around them. SIGNS AND SYMPTOMS  Some women with bacterial vaginosis have no signs or symptoms. Common symptoms include:  Grey vaginal discharge.  A fishlike odor with discharge, especially after sexual intercourse.  Itching or burning of the vagina and vulva.  Burning or pain with urination. DIAGNOSIS  Your health care provider will take a medical history and examine the vagina for signs of bacterial vaginosis. A sample of vaginal fluid may be taken. Your health care provider will look at this sample under a microscope to check for bacteria and abnormal cells. A vaginal pH test may also be done.  TREATMENT  Bacterial vaginosis may be treated with antibiotic medicines. These may be given in the form of a pill or a vaginal cream. A second round of antibiotics may be prescribed if the condition comes back after treatment.  HOME CARE INSTRUCTIONS   Only take over-the-counter or prescription medicines as  directed by your health care provider.  If antibiotic medicine was prescribed, take it as directed. Make sure you finish it even if you start to feel better.  Do not have sex until treatment is completed.  Tell all sexual partners that you have a vaginal infection. They should see their health care provider and be treated if they have problems, such as a mild rash or itching.  Practice safe sex by using condoms and only having one sex partner. SEEK MEDICAL CARE IF:   Your symptoms are not improving after 3 days of treatment.  You have increased discharge or pain.  You have a fever. MAKE SURE YOU:   Understand these instructions.  Will watch your condition.  Will get help right away if you are not doing well or get worse. FOR MORE INFORMATION  Centers for Disease Control and Prevention, Division of STD Prevention: www.cdc.gov/std American Sexual Health Association (ASHA): www.ashastd.org  Document Released: 05/24/2005 Document Revised: 03/14/2013 Document Reviewed: 01/03/2013 ExitCare Patient Information 2015 [employer]. This information is not intended to replace advice given to you by your health care provider. Make sure you discuss any questions you have with your health care provider. Bacterial Vaginosis Bacterial vaginosis is a vaginal infection that occurs when the normal balance of bacteria in the vagina is disrupted. It results from an overgrowth of certain bacteria. This is the most common vaginal infection in women of childbearing age. Treatment is important to prevent complications, especially in pregnant women, as it can cause a premature delivery. CAUSES  Bacterial vaginosis is caused by an increase in harmful bacteria that are normally present in smaller amounts in the vagina. Several different kinds of bacteria   can cause bacterial vaginosis. However, the reason that the condition develops is not fully understood. RISK FACTORS Certain activities or behaviors can put  you at an increased risk of developing bacterial vaginosis, including:  Having a new sex partner or multiple sex partners.  Douching.  Using an intrauterine device (IUD) for contraception. Women do not get bacterial vaginosis from toilet seats, bedding, swimming pools, or contact with objects around them. SIGNS AND SYMPTOMS  Some women with bacterial vaginosis have no signs or symptoms. Common symptoms include:  Grey vaginal discharge.  A fishlike odor with discharge, especially after sexual intercourse.  Itching or burning of the vagina and vulva.  Burning or pain with urination. DIAGNOSIS  Your health care provider will take a medical history and examine the vagina for signs of bacterial vaginosis. A sample of vaginal fluid may be taken. Your health care provider will look at this sample under a microscope to check for bacteria and abnormal cells. A vaginal pH test may also be done.  TREATMENT  Bacterial vaginosis may be treated with antibiotic medicines. These may be given in the form of a pill or a vaginal cream. A second round of antibiotics may be prescribed if the condition comes back after treatment.  HOME CARE INSTRUCTIONS   Only take over-the-counter or prescription medicines as directed by your health care provider.  If antibiotic medicine was prescribed, take it as directed. Make sure you finish it even if you start to feel better.  Do not have sex until treatment is completed.  Tell all sexual partners that you have a vaginal infection. They should see their health care provider and be treated if they have problems, such as a mild rash or itching.  Practice safe sex by using condoms and only having one sex partner. SEEK MEDICAL CARE IF:   Your symptoms are not improving after 3 days of treatment.  You have increased discharge or pain.  You have a fever. MAKE SURE YOU:   Understand these instructions.  Will watch your condition.  Will get help right away if  you are not doing well or get worse. FOR MORE INFORMATION  Centers for Disease Control and Prevention, Division of STD Prevention: www.cdc.gov/std American Sexual Health Association (ASHA): www.ashastd.org  Document Released: 05/24/2005 Document Revised: 03/14/2013 Document Reviewed: 01/03/2013 ExitCare Patient Information 2015 [employer]. This information is not intended to replace advice given to you by your health care provider. Make sure you discuss any questions you have with your health care provider.  

## 2013-12-22 NOTE — ED Provider Notes (Signed)
Medical screening examination/treatment/procedure(s) were performed by non-physician practitioner and as supervising physician I was immediately available for consultation/collaboration.    Johnna Acosta, MD 12/22/13 [phone]

## 2013-12-22 NOTE — ED Provider Notes (Signed)
CSN: [id number]     Arrival date & time 12/22/13  0013 History   First MD Initiated Contact with Patient 12/22/13 0029     Chief Complaint  Patient presents with  . Vaginal Itching     (Consider location/radiation/quality/duration/timing/severity/associated sxs/prior Treatment) HPI Comments: The patient is a 43 year old female with past history of HIV disease, obesity, urinary menses, diabetes present emergency room department with chief complaint of vaginal itching since yesterday. She reports using a wash cloth that irritated her. The patient reports vaginal burning and itching. She reports burning to the area with urination. Denies recent antibiotic use.  Denies abnormal discharge. Reports she is not currently sexually active. Patient's last menstrual period was 11/21/2013.   Patient is a 43 y.o. female presenting with vaginal itching. The history is provided by the patient. No language interpreter was used.  Vaginal Itching Pertinent negatives include no abdominal pain, chills, fever, nausea or vomiting.    Past Medical History  Diagnosis Date  . HIV disease   . Diabetes mellitus   . Hemorrhoids   . 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 Chronic folliculitis   . Iron deficiency anemia due to chronic blood loss   . Obesity   . Pneumothorax, traumatic   . Arthritis     knees  . Irregular menstrual cycle   . Migraines    Past Surgical History  Procedure Laterality Date  . Hysteroscopy w/d&c  Removal of Endometrial Polyp    Dr. Raphael Gibney 2011  . Chest tube insertion    . Tubal ligation     Family History  Problem Relation Age of Onset  . Diabetes Mother   . Heart failure Father   . Diabetes Father   . Heart failure Brother   . Colon cancer      neg hx.   History  Substance Use Topics  . Smoking status: Never Smoker   . Smokeless tobacco: Never Used  . Alcohol Use: No   OB History   Grav Para Term Preterm Abortions TAB SAB Ect Mult Living   8 4 4  4  4   4       Review of Systems  Constitutional: Negative for fever and chills.  Gastrointestinal: Negative for nausea, vomiting and abdominal pain.  Genitourinary: Negative for urgency, hematuria, vaginal bleeding and vaginal discharge.  Musculoskeletal: Negative for back pain.      Allergies  Cortisone; Hydrocodone-acetaminophen; and Morphine  Home Medications   Prior to Admission medications   Medication Sig Start Date End Date Taking? Authorizing Provider  emtricitabine-tenofovir (TRUVADA) 200-300 MG per tablet Take 1 tablet by mouth daily.    Historical Provider, MD  lopinavir-ritonavir (KALETRA) 200-50 MG per tablet Take 2 tablets by mouth 2 (two) times daily.    Historical Provider, MD  metFORMIN (GLUCOPHAGE) 500 MG tablet Take 1 tablet by mouth 2 (two) times daily. 06/26/13   Historical Provider, MD  NOVOFINE 32G X 6 MM MISC  11/21/12   Historical Provider, MD  ondansetron (ZOFRAN ODT) 8 MG disintegrating tablet Take 1 tablet (8 mg total) by mouth every 8 (eight) hours as needed for nausea or vomiting. 07/27/13   Kalman Drape, MD  temazepam (RESTORIL) 30 MG capsule  10/11/13   Historical Provider, MD  traMADol (ULTRAM) 50 MG tablet Take 50 mg by mouth every 6 (six) hours as needed.    Historical Provider, MD  VICTOZA 18 MG/3ML SOPN Inject 1.8 mLs into the skin daily.  11/27/12   Historical Provider,  MD   BP 144/83  Pulse 90  Temp(Src) 98.9 F (37.2 C) (Oral)  Resp 20  Ht 5\' 9"  (1.753 m)  Wt 225 lb (102.059 kg)  BMI 33.21 kg/m2  SpO2 95%  LMP 11/21/2013 Physical Exam  Nursing note and vitals reviewed. Constitutional: She is oriented to person, place, and time. She appears well-developed and well-nourished.  Non-toxic appearance. She does not have a sickly appearance. She does not appear ill. No distress.  HENT:  Head: Normocephalic and atraumatic.  Neck: Neck supple.  Pulmonary/Chest: Effort normal. No respiratory distress.  Genitourinary: There is tenderness on the left labia.  Uterus is not tender. Cervix exhibits no motion tenderness. Right adnexum displays no tenderness. Left adnexum displays no tenderness. There is erythema around the vagina. Vaginal discharge found.  Moderate amount of curd-like white-yellow discharge in vaginal vault. Mild erythremia of labia. No rash or vesicles noted. Chaperone present.   Neurological: She is alert and oriented to person, place, and time.  Skin: Skin is warm and dry. She is not diaphoretic.  Psychiatric: She has a normal mood and affect. Her behavior is normal.    ED Course  Procedures (including critical care time) Labs Review Labs Reviewed  WET PREP, GENITAL  GC/CHLAMYDIA PROBE AMP  PREGNANCY, URINE  URINALYSIS, ROUTINE W REFLEX MICROSCOPIC    Imaging Review No results found.   EKG Interpretation None      MDM   Final diagnoses:  Vaginal yeast infection  BV (bacterial vaginosis)   Patient presents with vaginal irritation complaints. Physical exam, consistent with yeast infection. Discussed patient history, condition with Carlota Raspberry, PA-C at shift change. Plan on treating for yeast infection and BV awaiting UA results.      Lorrine Kin, PA-C 12/23/13 [phone]

## 2013-12-22 NOTE — ED Notes (Signed)
Pt c/o vaginal burning and itching, onset after taking shower yesterday. Pt feels like her washrag may have been too rough.

## 2013-12-22 NOTE — ED Provider Notes (Signed)
Patient handed off to me by Harvie Heck, PA-C. Pt currently has urinalysis and urine preg pending, Her wet prep shows yeast infection and possibly BV. Will treat with Flagyl 2g one time dose in ED and 1 dose of Diflucan here and an rx for another dose to be taken in 3 days.   A urine culture has been sent out. Pt asked to leave in a hurry because her daughter was involved in bar fight, she did not wait for me to go back to exam room to discuss her results.  43 y.o.Brenda [name] evaluation in the Emergency Department is complete. It has been determined that no acute conditions requiring further emergency intervention are present at this time. The patient/guardian have been advised of the diagnosis and plan. We have discussed signs and symptoms that warrant return to the ED, such as changes or worsening in symptoms.  Vital signs are stable at discharge. Filed Vitals:   12/22/13 0238  BP: 160/92  Pulse: 90  Temp:   Resp: 18    Patient/guardian has voiced understanding and agreed to follow-up with the PCP or specialist.   Linus Mako, PA-C 12/22/13 0243  Linus Mako, PA-C 12/22/13 0245

## 2013-12-23 LAB — URINE CULTURE
Colony Count: 55000
Special Requests: NORMAL

## 2013-12-23 NOTE — ED Provider Notes (Signed)
Medical screening examination/treatment/procedure(s) were performed by non-physician practitioner and as supervising physician I was immediately available for consultation/collaboration.    Johnna Acosta, MD 12/23/13 [phone]

## 2013-12-24 LAB — GC/CHLAMYDIA PROBE AMP
CT PROBE, AMP APTIMA: NEGATIVE
GC Probe RNA: NEGATIVE

## 2013-12-27 ENCOUNTER — Encounter (HOSPITAL_COMMUNITY): Payer: Self-pay | Admitting: Pharmacist

## 2014-01-02 ENCOUNTER — Ambulatory Visit (INDEPENDENT_AMBULATORY_CARE_PROVIDER_SITE_OTHER): Payer: Medicare Other | Admitting: General Surgery

## 2014-01-02 ENCOUNTER — Encounter (INDEPENDENT_AMBULATORY_CARE_PROVIDER_SITE_OTHER): Payer: Self-pay | Admitting: General Surgery

## 2014-01-02 VITALS — BP 136/80 | HR 82 | Temp 97.5°F | Ht 69.0 in | Wt 228.0 lb

## 2014-01-02 DIAGNOSIS — K432 Incisional hernia without obstruction or gangrene: Secondary | ICD-10-CM

## 2014-01-02 NOTE — Patient Instructions (Addendum)
I will call and discuss your case with Dr Raphael Gibney  In the interim for your hemorrhoids... GETTING TO GOOD BOWEL HEALTH. Irregular bowel habits such as constipation and diarrhea can lead to many problems over time.  Having one soft bowel movement a day is the most important way to prevent further problems.  The anorectal canal is designed to handle stretching and feces to safely manage our ability to get rid of solid waste (feces, poop, stool) out of our body.  BUT, hard constipated stools can act like ripping concrete bricks and diarrhea can be a burning fire to this very sensitive area of our body, causing inflamed hemorrhoids, anal fissures, increasing risk is perirectal abscesses, abdominal pain/bloating, an making irritable bowel worse.     The goal: ONE SOFT BOWEL MOVEMENT A DAY!  To have soft, regular bowel movements:    Drink at least 8 tall glasses of water a day.     Take plenty of fiber.  Fiber is the undigested part of plant food that passes into the colon, acting s "natures broom" to encourage bowel motility and movement.  Fiber can absorb and hold large amounts of water. This results in a larger, bulkier stool, which is soft and easier to pass. Work gradually over several weeks up to 6 servings a day of fiber (25g a day even more if needed) in the form of: o Vegetables -- Root (potatoes, carrots, turnips), leafy green (lettuce, salad greens, celery, spinach), or cooked high residue (cabbage, broccoli, etc) o Fruit -- Fresh (unpeeled skin & pulp), Dried (prunes, apricots, cherries, etc ),  or stewed ( applesauce)  o Whole grain breads, pasta, etc (whole wheat)  o Bran cereals    Bulking Agents -- This type of water-retaining fiber generally is easily obtained each day by one of the following: Remember to slowly increase fiber to your diet in avoid bloating and cramping o Psyllium bran -- The psyllium plant is remarkable because its ground seeds can retain so much water. This product is  available as Metamucil, Konsyl, Effersyllium, Per Diem Fiber, or the less expensive generic preparation in drug and health food stores. Although labeled a laxative, it really is not a laxative.  o Methylcellulose -- This is another fiber derived from wood which also retains water. It is available as Citrucel. o Polyethylene Glycol - and "artificial" fiber commonly called Miralax or Glycolax.  It is helpful for people with gassy or bloated feelings with regular fiber o Flax Seed - a less gassy fiber than psyllium   No reading or other relaxing activity while on the toilet. If bowel movements take longer than 5 minutes, you are too constipated   AVOID CONSTIPATION.  High fiber and water intake usually takes care of this.  Sometimes a laxative is needed to stimulate more frequent bowel movements, but    Laxatives are not a good long-term solution as it can wear the colon out. o Osmotics (Milk of Magnesia, Fleets phosphosoda, Magnesium citrate, MiraLax, GoLytely) are safer than  o Stimulants (Senokot, Castor Oil, Dulcolax, Ex Lax)    o Do not take laxatives for more than 7days in a row.    IF SEVERELY CONSTIPATED, try a Bowel Retraining Program: o Do not use laxatives.  o Eat a diet high in roughage, such as bran cereals and leafy vegetables.  o Drink six (6) ounces of prune or apricot juice each morning.  o Eat two (2) large servings of stewed fruit each day.  o Take  one (1) heaping tablespoon of a psyllium-based bulking agent twice a day. Use sugar-free sweetener when possible to avoid excessive calories.  o Eat a normal breakfast.  o Set aside 15 minutes after breakfast to sit on the toilet, but do not strain to have a bowel movement.  o If you do not have a bowel movement by the third day, use an enema and repeat the above steps.  o

## 2014-01-03 NOTE — Progress Notes (Signed)
Subjective:     Patient ID: Brenda [name], female   DOB: 05-06-1971, 43 y.o.   MRN: [id number]  HPI 43 year old [place] American female comes in for followup regarding some hemorrhoids as well as a small umbilical hernia. Since her last visit she has been seen by her gynecologist who has scheduled her for a diagnostic laparoscopy next Thursday. She states that she still has some intermittent episodes of discomfort around her umbilicus. She has started a low dose fiber supplementation and reports some improvement with her hemorrhoids. Otherwise she denies any changes since she was last seen  PMHx, PSHx, SOCHx, FAMHx, ALL reviewed    Review of Systems A 6 point Review of systems was performed and all systems are negative except for what is mentioned in the history of present illness     Objective:   Physical Exam  Vitals reviewed. Constitutional: She appears well-developed and well-nourished. No distress.  obese  HENT:  Head: Normocephalic and atraumatic.  Eyes: Conjunctivae are normal.  Pulmonary/Chest: Effort normal. No respiratory distress.  Abdominal: Soft. She exhibits no distension. There is no rebound.    Very mild subtle periumbilical tenderness  Skin: She is not diaphoretic.   BP 136/80  Pulse 82  Temp(Src) 97.5 F (36.4 C)  Ht 5\' 9"  (1.753 m)  Wt 228 lb (103.42 kg)  BMI 33.65 kg/m2  LMP 11/21/2013     Assessment:     Internal hemorrhoids Small umbilical incisional hernia     Plan:     It appears she has had some improvement with her hemorrhoids. I encouraged her to continue with supplemental fiber and drinking plenty of water. I reviewed a CT scan of her abdomen. It shows a small fascial defect at her umbilicus containing only fat. I was not aware of that she has been scheduled for diagnostic laparoscopy until her followup visit today with me. Unfortunately I am not available next Thursday emergency surgeon for the hospital system. I explained that I would discuss  her case with her GYN. The fascial defect is so small I do not know if it would require mesh. I am hopeful that the GYN will feel comfortable placing a few extra sutures in her umbilical fascia at the conclusion of her diagnostic laparoscopy. This should give her a good repair. Right now will see her on an as-needed basis  Leighton Ruff. Redmond Pulling, MD, FACS General, Bariatric, & Minimally Invasive Surgery [facility], [place]

## 2014-01-08 ENCOUNTER — Encounter (HOSPITAL_COMMUNITY)
Admission: RE | Admit: 2014-01-08 | Discharge: 2014-01-08 | Disposition: A | Discharge status: Home / Self Care | Payer: Medicare Other | Source: Ambulatory Visit | Attending: Obstetrics and Gynecology | Admitting: Obstetrics and Gynecology

## 2014-01-08 ENCOUNTER — Encounter (HOSPITAL_COMMUNITY): Payer: Self-pay

## 2014-01-08 DIAGNOSIS — D509 Iron deficiency anemia, unspecified: Secondary | ICD-10-CM | POA: Diagnosis not present

## 2014-01-08 DIAGNOSIS — B459 Cryptococcosis, unspecified: Secondary | ICD-10-CM | POA: Diagnosis not present

## 2014-01-08 DIAGNOSIS — R21 Rash and other nonspecific skin eruption: Secondary | ICD-10-CM | POA: Diagnosis not present

## 2014-01-08 DIAGNOSIS — N926 Irregular menstruation, unspecified: Secondary | ICD-10-CM | POA: Diagnosis not present

## 2014-01-08 DIAGNOSIS — B451 Cerebral cryptococcosis: Secondary | ICD-10-CM | POA: Diagnosis not present

## 2014-01-08 DIAGNOSIS — Z833 Family history of diabetes mellitus: Secondary | ICD-10-CM | POA: Diagnosis not present

## 2014-01-08 DIAGNOSIS — N949 Unspecified condition associated with female genital organs and menstrual cycle: Secondary | ICD-10-CM | POA: Diagnosis not present

## 2014-01-08 DIAGNOSIS — L678 Other hair color and hair shaft abnormalities: Secondary | ICD-10-CM | POA: Diagnosis not present

## 2014-01-08 DIAGNOSIS — E669 Obesity, unspecified: Secondary | ICD-10-CM | POA: Diagnosis not present

## 2014-01-08 DIAGNOSIS — Z21 Asymptomatic human immunodeficiency virus [HIV] infection status: Secondary | ICD-10-CM | POA: Diagnosis not present

## 2014-01-08 DIAGNOSIS — E119 Type 2 diabetes mellitus without complications: Secondary | ICD-10-CM | POA: Diagnosis not present

## 2014-01-08 DIAGNOSIS — Z9851 Tubal ligation status: Secondary | ICD-10-CM | POA: Diagnosis not present

## 2014-01-08 DIAGNOSIS — G43909 Migraine, unspecified, not intractable, without status migrainosus: Secondary | ICD-10-CM | POA: Diagnosis not present

## 2014-01-08 DIAGNOSIS — L738 Other specified follicular disorders: Secondary | ICD-10-CM | POA: Diagnosis not present

## 2014-01-08 DIAGNOSIS — K429 Umbilical hernia without obstruction or gangrene: Secondary | ICD-10-CM | POA: Diagnosis not present

## 2014-01-08 DIAGNOSIS — N920 Excessive and frequent menstruation with regular cycle: Secondary | ICD-10-CM | POA: Diagnosis not present

## 2014-01-08 LAB — CBC
HEMATOCRIT: 30.3 % — AB (ref 36.0–46.0)
HEMOGLOBIN: 9.2 g/dL — AB (ref 12.0–15.0)
MCH: 19.9 pg — ABNORMAL LOW (ref 26.0–34.0)
MCHC: 30.4 g/dL (ref 30.0–36.0)
MCV: 65.4 fL — ABNORMAL LOW (ref 78.0–100.0)
Platelets: 401 10*3/uL — ABNORMAL HIGH (ref 150–400)
RBC: 4.63 MIL/uL (ref 3.87–5.11)
RDW: 19 % — ABNORMAL HIGH (ref 11.5–15.5)
WBC: 5.5 10*3/uL (ref 4.0–10.5)

## 2014-01-08 LAB — BASIC METABOLIC PANEL
ANION GAP: 11 (ref 5–15)
BUN: 10 mg/dL (ref 6–23)
CHLORIDE: 99 meq/L (ref 96–112)
CO2: 25 meq/L (ref 19–32)
Calcium: 8.6 mg/dL (ref 8.4–10.5)
Creatinine, Ser: 0.75 mg/dL (ref 0.50–1.10)
GFR calc non Af Amer: >90 mL/min (ref >90)
Glucose, Bld: 275 mg/dL — ABNORMAL HIGH (ref 70–99)
Potassium: 4 mEq/L (ref 3.7–5.3)
Sodium: 135 mEq/L — ABNORMAL LOW (ref 137–147)

## 2014-01-08 NOTE — Patient Instructions (Addendum)
Your procedure is scheduled on:01/10/14  Enter through the Main Entrance at : [place] up desk phone and dial [phone] and inform us of your arrival.  Please call [phone] if you have any problems the morning of surgery.  Remember: Do not eat food or drink liquids, including water, after midnight:Wed.    You may brush your teeth the morning of surgery.  Take these meds the morning of surgery with a sip of water: none  DO NOT wear jewelry, eye make-up, lipstick,body lotion, or dark fingernail polish.  (Polished toes are ok) You may wear deodorant.  If you are to be admitted after surgery, leave suitcase in car until your room has been assigned. Patients discharged on the day of surgery will not be allowed to drive home. Wear loose fitting, comfortable clothes for your ride home.

## 2014-01-08 NOTE — Pre-Procedure Instructions (Signed)
Dr. Jillyn Hidden made aware of patients glucose 275.  No new orders received at this time.

## 2014-01-09 DIAGNOSIS — G43719 Chronic migraine without aura, intractable, without status migrainosus: Secondary | ICD-10-CM | POA: Diagnosis not present

## 2014-01-09 NOTE — H&P (Signed)
Admission History and Physical Exam for a Gynecology Patient  [name] is a 43 y.o. female, [id number], who presents for a diagnostic laparoscopy. She has been followed at the [facility] and Gynecology division of [facility]. The patient complains of pelvic pain. No etiology has been found. The patient had an ultrasound that was thought to be within normal limits. It was thought that the uterus may appear consistent with adenomyosis. The patient also complains of heavy menstrual cycles. Her endometrial biopsy was negative. Her hemoglobin is 9.2. The patient has had a tubal ligation in the past. Her most recent Pap smear showed ASCUS with negative HPV. Her most recent mammogram was within normal limits. The patient had a hysteroscopy with a D&C in 2011 that removed endometrial polyps. The pathology report was benign. The patient has a small umbilical hernia. There is no strangulation of bowel.  OB History   Grav Para Term Preterm Abortions TAB SAB Ect Mult Living   8 4 4  4  4   4       Past Medical History  Diagnosis Date  . HIV disease   . Diabetes mellitus   . Hemorrhoids   . 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 Chronic folliculitis   . Iron deficiency anemia due to chronic blood loss   . Obesity   . Pneumothorax, traumatic   . Arthritis     knees  . Irregular menstrual cycle   . Migraines     No prescriptions prior to admission    Past Surgical History  Procedure Laterality Date  . Hysteroscopy w/d&c  Removal of Endometrial Polyp    Dr. Raphael Gibney 2011  . Chest tube insertion    . Tubal ligation      Allergies  Allergen Reactions  . Cortisone Itching    REACTION: "nerves"  . Hydrocodone-Acetaminophen Itching    REACTION: "nerves"  . Morphine Itching    REACTION: "nerves"    Family History: family history includes Colon cancer in an other family member; Diabetes in her father and mother; Heart failure in her brother and  father.  Social History:  reports that she has never smoked. She has never used smokeless tobacco. She reports that she does not drink alcohol or use illicit drugs.  Review of systems: See HPI.  Admission Physical Exam:    BMI equals 32.8.  There were no vitals taken for this visit.  HEENT:                 Within normal limits. The patient has a rash over her body. Chest:                   Clear Heart:                    Regular rate and rhythm Breasts:                No masses, skin changes, bleeding, or discharge present Abdomen:             Nontender, no masses Extremities:          Grossly normal Neurologic exam: Grossly normal  Pelvic exam:  External genitalia: normal general appearance Vaginal: normal without tenderness, induration or masses Cervix: normal appearance Adnexa: tenderness Uterus: tender and Upper limits normal size. Rectal: good sphincter tone  Assessment:  Pelvic pain Obesity Diabetes-poorly controlled HIV disease Rash Anemia  Plan:  The patient will undergo a  diagnostic laparoscopy. She understands the indications for her surgical procedure. She accepts the risk of, but not limited to, anesthetic complications, bleeding, infections, and possible damage to the surrounding organs. She understands that no guarantees can be given concerning the relief of her discomfort.   Eli Hose 01/09/2014

## 2014-01-10 ENCOUNTER — Encounter (HOSPITAL_COMMUNITY): Payer: Medicare Other | Admitting: Anesthesiology

## 2014-01-10 ENCOUNTER — Ambulatory Visit (HOSPITAL_COMMUNITY): Payer: Medicare Other | Admitting: Anesthesiology

## 2014-01-10 ENCOUNTER — Encounter (HOSPITAL_COMMUNITY)
Admission: RE | Disposition: A | Discharge status: Home / Self Care | Payer: Self-pay | Source: Ambulatory Visit | Attending: Obstetrics and Gynecology

## 2014-01-10 ENCOUNTER — Ambulatory Visit (HOSPITAL_COMMUNITY)
Admission: RE | Admit: 2014-01-10 | Discharge: 2014-01-10 | Disposition: A | Discharge status: Home / Self Care | Payer: Medicare Other | Source: Ambulatory Visit | Attending: Obstetrics and Gynecology | Admitting: Obstetrics and Gynecology

## 2014-01-10 ENCOUNTER — Encounter (HOSPITAL_COMMUNITY): Payer: Self-pay | Admitting: Anesthesiology

## 2014-01-10 DIAGNOSIS — Z9851 Tubal ligation status: Secondary | ICD-10-CM | POA: Insufficient documentation

## 2014-01-10 DIAGNOSIS — B451 Cerebral cryptococcosis: Secondary | ICD-10-CM | POA: Insufficient documentation

## 2014-01-10 DIAGNOSIS — E669 Obesity, unspecified: Secondary | ICD-10-CM | POA: Insufficient documentation

## 2014-01-10 DIAGNOSIS — E118 Type 2 diabetes mellitus with unspecified complications: Secondary | ICD-10-CM

## 2014-01-10 DIAGNOSIS — N920 Excessive and frequent menstruation with regular cycle: Secondary | ICD-10-CM | POA: Diagnosis not present

## 2014-01-10 DIAGNOSIS — Z833 Family history of diabetes mellitus: Secondary | ICD-10-CM | POA: Insufficient documentation

## 2014-01-10 DIAGNOSIS — K649 Unspecified hemorrhoids: Secondary | ICD-10-CM

## 2014-01-10 DIAGNOSIS — E1165 Type 2 diabetes mellitus with hyperglycemia: Secondary | ICD-10-CM

## 2014-01-10 DIAGNOSIS — R109 Unspecified abdominal pain: Secondary | ICD-10-CM | POA: Diagnosis not present

## 2014-01-10 DIAGNOSIS — N926 Irregular menstruation, unspecified: Secondary | ICD-10-CM | POA: Insufficient documentation

## 2014-01-10 DIAGNOSIS — K429 Umbilical hernia without obstruction or gangrene: Secondary | ICD-10-CM | POA: Insufficient documentation

## 2014-01-10 DIAGNOSIS — E119 Type 2 diabetes mellitus without complications: Secondary | ICD-10-CM | POA: Insufficient documentation

## 2014-01-10 DIAGNOSIS — L738 Other specified follicular disorders: Secondary | ICD-10-CM | POA: Insufficient documentation

## 2014-01-10 DIAGNOSIS — B20 Human immunodeficiency virus [HIV] disease: Secondary | ICD-10-CM

## 2014-01-10 DIAGNOSIS — B459 Cryptococcosis, unspecified: Secondary | ICD-10-CM | POA: Insufficient documentation

## 2014-01-10 DIAGNOSIS — R21 Rash and other nonspecific skin eruption: Secondary | ICD-10-CM | POA: Insufficient documentation

## 2014-01-10 DIAGNOSIS — Z21 Asymptomatic human immunodeficiency virus [HIV] infection status: Secondary | ICD-10-CM | POA: Insufficient documentation

## 2014-01-10 DIAGNOSIS — G43909 Migraine, unspecified, not intractable, without status migrainosus: Secondary | ICD-10-CM | POA: Insufficient documentation

## 2014-01-10 DIAGNOSIS — IMO0002 Reserved for concepts with insufficient information to code with codable children: Secondary | ICD-10-CM

## 2014-01-10 DIAGNOSIS — N736 Female pelvic peritoneal adhesions (postinfective): Secondary | ICD-10-CM | POA: Diagnosis not present

## 2014-01-10 DIAGNOSIS — D509 Iron deficiency anemia, unspecified: Secondary | ICD-10-CM | POA: Insufficient documentation

## 2014-01-10 DIAGNOSIS — L678 Other hair color and hair shaft abnormalities: Secondary | ICD-10-CM

## 2014-01-10 DIAGNOSIS — G02 Meningitis in other infectious and parasitic diseases classified elsewhere: Secondary | ICD-10-CM

## 2014-01-10 DIAGNOSIS — N949 Unspecified condition associated with female genital organs and menstrual cycle: Secondary | ICD-10-CM | POA: Diagnosis not present

## 2014-01-10 HISTORY — PX: UMBILICAL HERNIA REPAIR: SHX196

## 2014-01-10 HISTORY — PX: LAPAROSCOPY: SHX197

## 2014-01-10 HISTORY — PX: LAPAROSCOPIC LYSIS OF ADHESIONS: SHX5905

## 2014-01-10 LAB — GLUCOSE, CAPILLARY
GLUCOSE-CAPILLARY: 289 mg/dL — AB (ref 70–99)
GLUCOSE-CAPILLARY: 282 mg/dL — AB (ref 70–99)
GLUCOSE-CAPILLARY: 278 mg/dL — AB (ref 70–99)
GLUCOSE-CAPILLARY: 271 mg/dL — AB (ref 70–99)

## 2014-01-10 LAB — PREGNANCY, URINE: Preg Test, Ur: NEGATIVE

## 2014-01-10 SURGERY — LAPAROSCOPY OPERATIVE
Anesthesia: General | Site: Abdomen

## 2014-01-10 MED ORDER — PROPOFOL 10 MG/ML IV EMUL
INTRAVENOUS | Status: AC
  Filled 2014-01-10: qty 20

## 2014-01-10 MED ORDER — MINOCYCLINE HCL 100 MG PO CAPS: 100.0000 mg | ORAL_CAPSULE | Freq: Two times a day (BID) | ORAL | Status: DC

## 2014-01-10 MED ORDER — KETOROLAC TROMETHAMINE 30 MG/ML IJ SOLN
INTRAMUSCULAR | Status: AC
  Filled 2014-01-10: qty 1

## 2014-01-10 MED ORDER — HYDROMORPHONE HCL PF 1 MG/ML IJ SOLN: 0.2500 mg | INTRAMUSCULAR | Status: DC | PRN

## 2014-01-10 MED ORDER — LACTATED RINGERS IV SOLN
INTRAVENOUS | Status: DC
  Administered 2014-01-10 (×2): via INTRAVENOUS

## 2014-01-10 MED ORDER — LIDOCAINE HCL (CARDIAC) 20 MG/ML IV SOLN
INTRAVENOUS | Status: DC | PRN
  Administered 2014-01-10: 70 mg via INTRAVENOUS
  Administered 2014-01-10: 30 mg via INTRAVENOUS

## 2014-01-10 MED ORDER — ONDANSETRON HCL 4 MG/2ML IJ SOLN
INTRAMUSCULAR | Status: DC | PRN
  Administered 2014-01-10: 4 mg via INTRAVENOUS

## 2014-01-10 MED ORDER — FENTANYL CITRATE 0.05 MG/ML IJ SOLN
INTRAMUSCULAR | Status: DC | PRN
  Administered 2014-01-10 (×3): 50 ug via INTRAVENOUS

## 2014-01-10 MED ORDER — IBUPROFEN 800 MG PO TABS: 800.0000 mg | ORAL_TABLET | Freq: Three times a day (TID) | ORAL | Status: DC | PRN

## 2014-01-10 MED ORDER — PROMETHAZINE HCL 25 MG/ML IJ SOLN: 6.2500 mg | INTRAMUSCULAR | Status: DC | PRN

## 2014-01-10 MED ORDER — BUPIVACAINE-EPINEPHRINE 0.5% -1:200000 IJ SOLN
INTRAMUSCULAR | Status: DC | PRN
  Administered 2014-01-10: 5 mL
  Administered 2014-01-10: 3 mL

## 2014-01-10 MED ORDER — FENTANYL CITRATE 0.05 MG/ML IJ SOLN
INTRAMUSCULAR | Status: AC
  Filled 2014-01-10: qty 2

## 2014-01-10 MED ORDER — MEPERIDINE HCL 25 MG/ML IJ SOLN: 6.2500 mg | INTRAMUSCULAR | Status: DC | PRN

## 2014-01-10 MED ORDER — MIDAZOLAM HCL 2 MG/2ML IJ SOLN: 0.5000 mg | Freq: Once | INTRAMUSCULAR | Status: DC | PRN

## 2014-01-10 MED ORDER — SCOPOLAMINE 1 MG/3DAYS TD PT72
MEDICATED_PATCH | TRANSDERMAL | Status: AC
  Administered 2014-01-10: 1.5 mg via TRANSDERMAL
  Filled 2014-01-10: qty 1

## 2014-01-10 MED ORDER — KETOROLAC TROMETHAMINE 30 MG/ML IJ SOLN: 15.0000 mg | Freq: Once | INTRAMUSCULAR | Status: DC | PRN

## 2014-01-10 MED ORDER — ONDANSETRON HCL 4 MG/2ML IJ SOLN
INTRAMUSCULAR | Status: AC
  Filled 2014-01-10: qty 2

## 2014-01-10 MED ORDER — HEPARIN SODIUM (PORCINE) 5000 UNIT/ML IJ SOLN
INTRAMUSCULAR | Status: AC
  Filled 2014-01-10: qty 1

## 2014-01-10 MED ORDER — ROCURONIUM BROMIDE 100 MG/10ML IV SOLN
INTRAVENOUS | Status: AC
  Filled 2014-01-10: qty 1

## 2014-01-10 MED ORDER — INSULIN ASPART 100 UNIT/ML ~~LOC~~ SOLN
SUBCUTANEOUS | Status: AC
  Filled 2014-01-10: qty 1

## 2014-01-10 MED ORDER — INSULIN ASPART 100 UNIT/ML ~~LOC~~ SOLN
2.0000 [IU] | Freq: Once | SUBCUTANEOUS | Status: AC
  Administered 2014-01-10: 2 [IU] via SUBCUTANEOUS
  Filled 2014-01-10: qty 0.02

## 2014-01-10 MED ORDER — FENTANYL CITRATE 0.05 MG/ML IJ SOLN
25.0000 ug | INTRAMUSCULAR | Status: DC | PRN
  Administered 2014-01-10: 50 ug via INTRAVENOUS

## 2014-01-10 MED ORDER — KETOROLAC TROMETHAMINE 30 MG/ML IJ SOLN
INTRAMUSCULAR | Status: DC | PRN
  Administered 2014-01-10 (×2): 30 mg via INTRAVENOUS

## 2014-01-10 MED ORDER — MIDAZOLAM HCL 2 MG/2ML IJ SOLN
INTRAMUSCULAR | Status: DC | PRN
  Administered 2014-01-10: 2 mg via INTRAVENOUS

## 2014-01-10 MED ORDER — BUPIVACAINE-EPINEPHRINE (PF) 0.5% -1:200000 IJ SOLN
INTRAMUSCULAR | Status: AC
  Filled 2014-01-10: qty 30

## 2014-01-10 MED ORDER — INSULIN REGULAR HUMAN 100 UNIT/ML IJ SOLN
2.0000 [IU] | Freq: Once | INTRAMUSCULAR | Status: DC
  Filled 2014-01-10: qty 0.02

## 2014-01-10 MED ORDER — PROPOFOL 10 MG/ML IV BOLUS
INTRAVENOUS | Status: DC | PRN
  Administered 2014-01-10: 190 mg via INTRAVENOUS

## 2014-01-10 MED ORDER — TRAMADOL HCL 50 MG PO TABS
50.0000 mg | ORAL_TABLET | Freq: Once | ORAL | Status: AC
  Administered 2014-01-10: 50 mg via ORAL
  Filled 2014-01-10: qty 1

## 2014-01-10 MED ORDER — INSULIN ASPART 100 UNIT/ML ~~LOC~~ SOLN
4.0000 [IU] | Freq: Once | SUBCUTANEOUS | Status: AC
  Administered 2014-01-10: 4 [IU] via SUBCUTANEOUS

## 2014-01-10 MED ORDER — MIDAZOLAM HCL 2 MG/2ML IJ SOLN
INTRAMUSCULAR | Status: AC
  Filled 2014-01-10: qty 2

## 2014-01-10 MED ORDER — NEOSTIGMINE METHYLSULFATE 10 MG/10ML IV SOLN
INTRAVENOUS | Status: AC
  Filled 2014-01-10: qty 1

## 2014-01-10 MED ORDER — ROCURONIUM BROMIDE 100 MG/10ML IV SOLN
INTRAVENOUS | Status: DC | PRN
  Administered 2014-01-10: 50 mg via INTRAVENOUS

## 2014-01-10 MED ORDER — CEFAZOLIN SODIUM-DEXTROSE 2-3 GM-% IV SOLR
INTRAVENOUS | Status: AC
  Filled 2014-01-10: qty 50

## 2014-01-10 MED ORDER — LIDOCAINE HCL (CARDIAC) 20 MG/ML IV SOLN
INTRAVENOUS | Status: AC
  Filled 2014-01-10: qty 5

## 2014-01-10 MED ORDER — CEFAZOLIN SODIUM-DEXTROSE 2-3 GM-% IV SOLR
INTRAVENOUS | Status: DC | PRN
  Administered 2014-01-10: 2 g via INTRAVENOUS

## 2014-01-10 MED ORDER — NEOSTIGMINE METHYLSULFATE 10 MG/10ML IV SOLN
INTRAVENOUS | Status: DC | PRN
  Administered 2014-01-10: 3 mg via INTRAVENOUS

## 2014-01-10 MED ORDER — GLYCOPYRROLATE 0.2 MG/ML IJ SOLN
INTRAMUSCULAR | Status: DC | PRN
  Administered 2014-01-10: 0.6 mg via INTRAVENOUS

## 2014-01-10 MED ORDER — TRAMADOL HCL 50 MG PO TABS: 100.0000 mg | ORAL_TABLET | Freq: Four times a day (QID) | ORAL | Status: DC | PRN

## 2014-01-10 MED ORDER — FENTANYL CITRATE 0.05 MG/ML IJ SOLN
INTRAMUSCULAR | Status: AC
  Filled 2014-01-10: qty 5

## 2014-01-10 MED ORDER — SCOPOLAMINE 1 MG/3DAYS TD PT72
1.0000 | MEDICATED_PATCH | Freq: Once | TRANSDERMAL | Status: DC
  Administered 2014-01-10: 1.5 mg via TRANSDERMAL

## 2014-01-10 MED ORDER — GLYCOPYRROLATE 0.2 MG/ML IJ SOLN
INTRAMUSCULAR | Status: AC
Start: 2014-01-10 — End: 2014-01-10
  Filled 2014-01-10: qty 3

## 2014-01-10 MED FILL — Heparin Sodium (Porcine) Inj 5000 Unit/ML: INTRAMUSCULAR | Qty: 1 | Status: AC

## 2014-01-10 SURGICAL SUPPLY — 31 items
BAG SPEC RTRVL LRG 6X4 10 (ENDOMECHANICALS)
BLADE SURG 11 STRL SS (BLADE) ×3 IMPLANT
CABLE HIGH FREQUENCY MONO STRZ (ELECTRODE) IMPLANT
CHLORAPREP W/TINT 26ML (MISCELLANEOUS) ×3 IMPLANT
CLOTH BEACON ORANGE TIMEOUT ST (SAFETY) ×3 IMPLANT
DRSG COVADERM PLUS 2X2 (GAUZE/BANDAGES/DRESSINGS) ×6 IMPLANT
DRSG OPSITE POSTOP 3X4 (GAUZE/BANDAGES/DRESSINGS) IMPLANT
FORCEPS CUTTING 33CM 5MM (CUTTING FORCEPS) IMPLANT
FORCEPS CUTTING 45CM 5MM (CUTTING FORCEPS) IMPLANT
GLOVE BIOGEL PI IND STRL 8.5 (GLOVE) ×2 IMPLANT
GLOVE BIOGEL PI INDICATOR 8.5 (GLOVE) ×1
GLOVE ECLIPSE 8.0 STRL XLNG CF (GLOVE) ×6 IMPLANT
GOWN STRL REUS W/TWL 2XL LVL3 (GOWN DISPOSABLE) ×3 IMPLANT
GOWN STRL REUS W/TWL LRG LVL3 (GOWN DISPOSABLE) ×6 IMPLANT
NS IRRIG 1000ML POUR BTL (IV SOLUTION) ×3 IMPLANT
PACK LAPAROSCOPY BASIN (CUSTOM PROCEDURE TRAY) ×3 IMPLANT
POUCH SPECIMEN RETRIEVAL 10MM (ENDOMECHANICALS) IMPLANT
PROTECTOR NERVE ULNAR (MISCELLANEOUS) ×3 IMPLANT
RINGERS IRRIG 1000ML POUR BTL (IV SOLUTION) IMPLANT
SET IRRIG TUBING LAPAROSCOPIC (IRRIGATION / IRRIGATOR) IMPLANT
SHEARS HARMONIC ACE PLUS 36CM (ENDOMECHANICALS) IMPLANT
STRIP CLOSURE SKIN 1/4X3 (GAUZE/BANDAGES/DRESSINGS) ×3 IMPLANT
SUT MNCRL AB 3-0 PS2 27 (SUTURE) ×3 IMPLANT
SUT SILK 2 0 SH (SUTURE) ×3 IMPLANT
SUT VIC AB 2-0 UR6 27 (SUTURE) ×6 IMPLANT
SUT VICRYL 0 ENDOLOOP (SUTURE) IMPLANT
SYRINGE 60CC LL (MISCELLANEOUS) IMPLANT
TOWEL OR 17X24 6PK STRL BLUE (TOWEL DISPOSABLE) ×6 IMPLANT
TRAY FOLEY CATH 14FR (SET/KITS/TRAYS/PACK) ×3 IMPLANT
WARMER LAPAROSCOPE (MISCELLANEOUS) ×3 IMPLANT
WATER STERILE IRR 1000ML POUR (IV SOLUTION) IMPLANT

## 2014-01-10 NOTE — Anesthesia Preprocedure Evaluation (Addendum)
Anesthesia Evaluation  Patient identified by MRN, date of birth, ID band Patient awake    Reviewed: Allergy & Precautions, H&P , Patient's Chart, lab work & pertinent test results, reviewed documented beta blocker date and time   History of Anesthesia Complications Negative for: history of anesthetic complications  Airway Mallampati: III TM Distance: >3 FB Neck ROM: full    Dental   Pulmonary  breath sounds clear to auscultation        Cardiovascular Exercise Tolerance: Good Rhythm:regular Rate:Normal     Neuro/Psych  Headaches,    GI/Hepatic   Endo/Other  diabetes  Renal/GU      Musculoskeletal   Abdominal   Peds  Hematology  (+) anemia ,   Anesthesia Other Findings HIV Cryptococcus meningitis in past  Reproductive/Obstetrics                          Anesthesia Physical Anesthesia Plan  ASA: III  Anesthesia Plan: General ETT   Post-op Pain Management:    Induction:   Airway Management Planned:   Additional Equipment:   Intra-op Plan:   Post-operative Plan:   Informed Consent: I have reviewed the patients History and Physical, chart, labs and discussed the procedure including the risks, benefits and alternatives for the proposed anesthesia with the patient or authorized representative who has indicated his/her understanding and acceptance.   Dental Advisory Given  Plan Discussed with: CRNA and Surgeon  Anesthesia Plan Comments:         Anesthesia Quick Evaluation

## 2014-01-10 NOTE — H&P (Signed)
BP 147/87  Pulse 88  Temp(Src) 98.1 F (36.7 C) (Oral)  Resp 20  SpO2 98%  Results for orders placed during the hospital encounter of 01/10/14 (from the past 24 hour(s))  GLUCOSE, CAPILLARY     Status: Abnormal   Collection Time    01/10/14  7:21 AM      Result Value Ref Range   Glucose-Capillary 289 (*) 70 - 99 mg/dL  PREGNANCY, URINE     Status: None   Collection Time    01/10/14  7:55 AM      Result Value Ref Range   Preg Test, Ur NEGATIVE  NEGATIVE  GLUCOSE, CAPILLARY     Status: Abnormal   Collection Time    01/10/14  8:19 AM      Result Value Ref Range   Glucose-Capillary 282 (*) 70 - 99 mg/dL   Chest: Clear Heart: RRR Abd: soft  The patient was interviewed and examined today.  The previously documented history and physical examination was reviewed. There are no changes. The operative procedure was reviewed. The risks and benefits were outlined again. The specific risks include, but are not limited to, anesthetic complications, bleeding, infections, and possible damage to the surrounding organs. The patient's questions were answered.  We are ready to proceed as outlined. The likelihood of the patient achieving the goals of this procedure is very likely.   Gildardo Cranker, M.D.

## 2014-01-10 NOTE — Anesthesia Postprocedure Evaluation (Signed)
Anesthesia Post Note  Patient: Brenda [name]  Procedure(s) Performed: Procedure(s) (LRB): LAPAROSCOPY OPERATIVE (N/A) HERNIA REPAIR UMBILICAL ADULT LAPAROSCOPIC LYSIS OF ADHESIONS  Anesthesia type: GA  Patient location: PACU  Post pain: Pain level controlled  Post assessment: Post-op Vital signs reviewed  Last Vitals:  Filed Vitals:   01/10/14 0956  BP: 145/90  Pulse: 101  Temp: 36.8 C  Resp: 16    Post vital signs: Reviewed  Level of consciousness: sedated  Complications: No apparent anesthesia complications

## 2014-01-10 NOTE — Op Note (Signed)
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  Assistant:  None  Anesthetic:  General  Disposition:  Brenda [name] is a 43 y.o. year old [id number], who presents for diagnostic laparoscopy. The patient understands the indications for surgical procedure. She accepts the risk of, but not limited to, anesthetic complications, bleeding, infections, and possible damage to the surrounding organs. She understands that I cannot guarantee that I will leave her pelvic pain. The patient has a small umbilical hernia that I will suture closed.  Findings:  The uterus and ovaries appeared normal. The fallopian tubes had defects from her prior tubal sterilization. There were filmy adhesions in the left lower posterior cul-de-sac that seemed consistent with old pelvic inflammatory disease. The liver and the bowel and the appendix all appeared normal.  Procedure:  The patient was taken to the operating room where a general anesthetic was given. The patient's abdomen, perineum, and vagina were prepped with sterile solution. The bladder was drained of urine. An examination under anesthesia was performed. A Hulka tenaculum was placed inside the uterus. The patient was sterilely draped. The subumbilical area was injected with half percent Marcaine with epinephrine. A subumbilical incision was made and the incision was carried to the level of the fascia. A small hernia was dissected from the fascia. The peritoneum was entered. There was no evidence of damage to the abdominal  contents. A Hassan cannula was sutured into place. A pneumoperitoneum was obtained. The laparoscope was inserted. The pelvic organs were inspected with findings as mentioned above. An area located cephalad of the suprapubic midline was injected with Marcaine with epinephrine. A small incision was made. A 5 mm trocar was placed in the abdominal cavity under direct visualization. The pelvis was again inspected. Pictures were taken. The adhesions in the posterior cul-de-sac to the left of the midline were lysed using blunt and sharp dissection. At the end of the procedure there were no other adhesions to be removed. Hemostasis was adequate. The pneumoperitoneum was allowed to escape. All instruments were removed. The subumbilical fascia was closed using interrupted sutures of 2-0 silk (closing the small hernia). The skin was reapproximated using 3-0 Monocryl. Sponge and needle counts were correct. The estimated blood loss was less than less than 10 cc. The patient tolerated her procedure well. She was awakened from her anesthetic without difficulty. She was transported to the recovery room in stable condition. The patient was given 2 g of Ancef IV and 60 mg of Toradol intraoperatively.  Followup instructions:  The patient will return to see Dr. Raphael Gibney in 2 weeks. She was given a copy of the postoperative instructions as prepared by the [facility] for patients who've undergone laparoscopy.  Discharge medications:  Motrin 800 mg tablets            One tablet every 8 hours as needed for moderate pain. Tramadol 50 mg tablets 2 tablets every 6 hours as needed for pain. Minocycline 100 mg  one tablet twice a day for 10 days  Gildardo Cranker, M.D.

## 2014-01-10 NOTE — Transfer of Care (Signed)
Immediate Anesthesia Transfer of Care Note  Patient: Brenda [name]  Procedure(s) Performed: Procedure(s): LAPAROSCOPY OPERATIVE (N/A) HERNIA REPAIR UMBILICAL ADULT LAPAROSCOPIC LYSIS OF ADHESIONS  Patient Location: PACU  Anesthesia Type:General  Level of Consciousness: awake, sedated and patient cooperative  Airway & Oxygen Therapy: Patient Spontanous Breathing and Patient connected to nasal cannula oxygen  Post-op Assessment: Report given to PACU RN and Post -op Vital signs reviewed and stable  Post vital signs: Reviewed and stable  Complications: No apparent anesthesia complications

## 2014-01-10 NOTE — Discharge Instructions (Signed)

## 2014-01-12 ENCOUNTER — Encounter (HOSPITAL_COMMUNITY): Payer: Self-pay | Admitting: Obstetrics and Gynecology

## 2014-01-24 DIAGNOSIS — R109 Unspecified abdominal pain: Secondary | ICD-10-CM | POA: Diagnosis not present

## 2014-01-24 DIAGNOSIS — K649 Unspecified hemorrhoids: Secondary | ICD-10-CM | POA: Diagnosis not present

## 2014-01-29 DIAGNOSIS — I10 Essential (primary) hypertension: Secondary | ICD-10-CM | POA: Diagnosis not present

## 2014-01-29 DIAGNOSIS — K649 Unspecified hemorrhoids: Secondary | ICD-10-CM | POA: Diagnosis not present

## 2014-01-29 DIAGNOSIS — E119 Type 2 diabetes mellitus without complications: Secondary | ICD-10-CM | POA: Diagnosis not present

## 2014-02-08 DIAGNOSIS — M7710 Lateral epicondylitis, unspecified elbow: Secondary | ICD-10-CM | POA: Diagnosis not present

## 2014-02-08 DIAGNOSIS — S63509A Unspecified sprain of unspecified wrist, initial encounter: Secondary | ICD-10-CM | POA: Diagnosis not present

## 2014-02-12 DIAGNOSIS — S63509A Unspecified sprain of unspecified wrist, initial encounter: Secondary | ICD-10-CM | POA: Diagnosis not present

## 2014-03-20 ENCOUNTER — Other Ambulatory Visit: Payer: Self-pay | Admitting: *Deleted

## 2014-03-20 ENCOUNTER — Other Ambulatory Visit: Payer: Self-pay | Admitting: Infectious Diseases

## 2014-03-20 DIAGNOSIS — B20 Human immunodeficiency virus [HIV] disease: Secondary | ICD-10-CM

## 2014-03-20 MED ORDER — EMTRICITABINE-TENOFOVIR DF 200-300 MG PO TABS: 1.0000 | ORAL_TABLET | Freq: Every day | ORAL | Status: DC

## 2014-03-20 MED ORDER — LOPINAVIR-RITONAVIR 200-50 MG PO TABS: 2.0000 | ORAL_TABLET | Freq: Every day | ORAL | Status: DC

## 2014-03-20 NOTE — Telephone Encounter (Signed)
Instructed pharmacy to include, "Please tell pt to make return MD and lab work appts."

## 2014-03-29 DIAGNOSIS — M7711 Lateral epicondylitis, right elbow: Secondary | ICD-10-CM | POA: Diagnosis not present

## 2014-04-08 ENCOUNTER — Encounter (HOSPITAL_COMMUNITY): Payer: Self-pay | Admitting: Obstetrics and Gynecology

## 2014-04-15 ENCOUNTER — Ambulatory Visit: Payer: Self-pay | Admitting: Internal Medicine

## 2014-04-16 ENCOUNTER — Encounter: Payer: Self-pay | Admitting: Internal Medicine

## 2014-04-19 DIAGNOSIS — J069 Acute upper respiratory infection, unspecified: Secondary | ICD-10-CM | POA: Diagnosis not present

## 2014-04-19 DIAGNOSIS — R030 Elevated blood-pressure reading, without diagnosis of hypertension: Secondary | ICD-10-CM | POA: Diagnosis not present

## 2014-04-19 DIAGNOSIS — E0800 Diabetes mellitus due to underlying condition with hyperosmolarity without nonketotic hyperglycemic-hyperosmolar coma (NKHHC): Secondary | ICD-10-CM | POA: Diagnosis not present

## 2014-05-07 DIAGNOSIS — I10 Essential (primary) hypertension: Secondary | ICD-10-CM | POA: Diagnosis not present

## 2014-05-07 DIAGNOSIS — E119 Type 2 diabetes mellitus without complications: Secondary | ICD-10-CM | POA: Diagnosis not present

## 2014-05-08 DIAGNOSIS — J069 Acute upper respiratory infection, unspecified: Secondary | ICD-10-CM | POA: Diagnosis not present

## 2014-05-21 DIAGNOSIS — E1165 Type 2 diabetes mellitus with hyperglycemia: Secondary | ICD-10-CM | POA: Diagnosis not present

## 2014-05-21 DIAGNOSIS — M545 Low back pain: Secondary | ICD-10-CM | POA: Diagnosis not present

## 2014-05-21 DIAGNOSIS — K6289 Other specified diseases of anus and rectum: Secondary | ICD-10-CM | POA: Diagnosis not present

## 2014-05-22 DIAGNOSIS — K6289 Other specified diseases of anus and rectum: Secondary | ICD-10-CM | POA: Diagnosis not present

## 2014-05-22 DIAGNOSIS — E1165 Type 2 diabetes mellitus with hyperglycemia: Secondary | ICD-10-CM | POA: Diagnosis not present

## 2014-05-22 DIAGNOSIS — M792 Neuralgia and neuritis, unspecified: Secondary | ICD-10-CM | POA: Diagnosis not present

## 2014-05-23 ENCOUNTER — Other Ambulatory Visit: Payer: Self-pay | Admitting: Infectious Diseases

## 2014-06-04 ENCOUNTER — Encounter: Payer: Medicare Other | Attending: Internal Medicine

## 2014-06-04 VITALS — Ht 69.0 in | Wt 227.2 lb

## 2014-06-04 DIAGNOSIS — Z713 Dietary counseling and surveillance: Secondary | ICD-10-CM | POA: Insufficient documentation

## 2014-06-04 DIAGNOSIS — E119 Type 2 diabetes mellitus without complications: Secondary | ICD-10-CM | POA: Insufficient documentation

## 2014-06-04 NOTE — Progress Notes (Signed)

## 2014-06-11 ENCOUNTER — Encounter: Payer: Medicare Other | Attending: Internal Medicine

## 2014-06-11 ENCOUNTER — Other Ambulatory Visit: Payer: Medicare Other

## 2014-06-11 DIAGNOSIS — Z713 Dietary counseling and surveillance: Secondary | ICD-10-CM | POA: Diagnosis not present

## 2014-06-11 DIAGNOSIS — B20 Human immunodeficiency virus [HIV] disease: Secondary | ICD-10-CM | POA: Diagnosis not present

## 2014-06-11 DIAGNOSIS — E119 Type 2 diabetes mellitus without complications: Secondary | ICD-10-CM | POA: Insufficient documentation

## 2014-06-11 LAB — CBC WITH DIFFERENTIAL/PLATELET
BASOS ABS: 0.1 10*3/uL (ref 0.0–0.1)
BASOS PCT: 1 % (ref 0–1)
Eosinophils Absolute: 0.2 10*3/uL (ref 0.0–0.7)
Eosinophils Relative: 4 % (ref 0–5)
HEMATOCRIT: 27.9 % — AB (ref 36.0–46.0)
HEMOGLOBIN: 8.5 g/dL — AB (ref 12.0–15.0)
LYMPHS PCT: 38 % (ref 12–46)
Lymphs Abs: 2.4 10*3/uL (ref 0.7–4.0)
MCH: 18.1 pg — ABNORMAL LOW (ref 26.0–34.0)
MCHC: 30.5 g/dL (ref 30.0–36.0)
MCV: 59.5 fL — ABNORMAL LOW (ref 78.0–100.0)
MPV: 8.7 fL (ref 8.6–12.4)
Monocytes Absolute: 0.4 10*3/uL (ref 0.1–1.0)
Monocytes Relative: 6 % (ref 3–12)
NEUTROS ABS: 3.2 10*3/uL (ref 1.7–7.7)
Neutrophils Relative %: 51 % (ref 43–77)
Platelets: 503 10*3/uL — ABNORMAL HIGH (ref 150–400)
RBC: 4.69 MIL/uL (ref 3.87–5.11)
RDW: 17.6 % — AB (ref 11.5–15.5)
WBC: 6.2 10*3/uL (ref 4.0–10.5)

## 2014-06-11 LAB — COMPLETE METABOLIC PANEL WITH GFR
ALBUMIN: 4.1 g/dL (ref 3.5–5.2)
ALK PHOS: 116 U/L (ref 39–117)
ALT: 9 U/L (ref 0–35)
AST: 14 U/L (ref 0–37)
BILIRUBIN TOTAL: 0.2 mg/dL (ref 0.2–1.2)
BUN: 10 mg/dL (ref 6–23)
CO2: 25 mEq/L (ref 19–32)
Calcium: 9.1 mg/dL (ref 8.4–10.5)
Chloride: 101 mEq/L (ref 96–112)
Creat: 0.85 mg/dL (ref 0.50–1.10)
GFR, Est African American: >89 mL/min
GFR, Est Non African American: 83 mL/min
Glucose, Bld: 201 mg/dL — ABNORMAL HIGH (ref 70–99)
POTASSIUM: 4.2 meq/L (ref 3.5–5.3)
SODIUM: 136 meq/L (ref 135–145)
TOTAL PROTEIN: 7.8 g/dL (ref 6.0–8.3)

## 2014-06-11 NOTE — Progress Notes (Signed)

## 2014-06-12 LAB — HIV-1 RNA QUANT-NO REFLEX-BLD: HIV 1 RNA Quant: <20 copies/mL (ref <20)

## 2014-06-12 LAB — HEPATITIS B SURFACE ANTIBODY,QUALITATIVE: Hep B S Ab: NEGATIVE

## 2014-06-12 LAB — T-HELPER CELL (CD4) - (RCID CLINIC ONLY)
CD4 % Helper T Cell: 32 % — ABNORMAL LOW (ref 33–55)
CD4 T Cell Abs: 760 /uL (ref 400–2700)

## 2014-06-17 DIAGNOSIS — E1165 Type 2 diabetes mellitus with hyperglycemia: Secondary | ICD-10-CM | POA: Diagnosis not present

## 2014-06-17 DIAGNOSIS — E1142 Type 2 diabetes mellitus with diabetic polyneuropathy: Secondary | ICD-10-CM | POA: Diagnosis not present

## 2014-06-17 DIAGNOSIS — R197 Diarrhea, unspecified: Secondary | ICD-10-CM | POA: Diagnosis not present

## 2014-06-18 ENCOUNTER — Ambulatory Visit: Payer: Self-pay

## 2014-06-25 DIAGNOSIS — S83242A Other tear of medial meniscus, current injury, left knee, initial encounter: Secondary | ICD-10-CM | POA: Diagnosis not present

## 2014-06-25 DIAGNOSIS — E162 Hypoglycemia, unspecified: Secondary | ICD-10-CM | POA: Diagnosis not present

## 2014-06-25 DIAGNOSIS — E1065 Type 1 diabetes mellitus with hyperglycemia: Secondary | ICD-10-CM | POA: Diagnosis not present

## 2014-06-25 DIAGNOSIS — M545 Low back pain: Secondary | ICD-10-CM | POA: Diagnosis not present

## 2014-06-26 ENCOUNTER — Ambulatory Visit: Payer: Self-pay | Admitting: Infectious Diseases

## 2014-06-27 ENCOUNTER — Ambulatory Visit (INDEPENDENT_AMBULATORY_CARE_PROVIDER_SITE_OTHER): Payer: Medicare Other | Admitting: Infectious Diseases

## 2014-06-27 ENCOUNTER — Encounter: Payer: Self-pay | Admitting: Infectious Diseases

## 2014-06-27 VITALS — BP 144/90 | HR 91 | Temp 97.9°F | Ht 69.0 in | Wt 212.0 lb

## 2014-06-27 DIAGNOSIS — Z794 Long term (current) use of insulin: Secondary | ICD-10-CM | POA: Diagnosis not present

## 2014-06-27 DIAGNOSIS — Z113 Encounter for screening for infections with a predominantly sexual mode of transmission: Secondary | ICD-10-CM

## 2014-06-27 DIAGNOSIS — B20 Human immunodeficiency virus [HIV] disease: Secondary | ICD-10-CM | POA: Diagnosis not present

## 2014-06-27 DIAGNOSIS — E1165 Type 2 diabetes mellitus with hyperglycemia: Secondary | ICD-10-CM

## 2014-06-27 DIAGNOSIS — Z23 Encounter for immunization: Secondary | ICD-10-CM

## 2014-06-27 DIAGNOSIS — IMO0002 Reserved for concepts with insufficient information to code with codable children: Secondary | ICD-10-CM

## 2014-06-27 MED ORDER — ELVITEG-COBIC-EMTRICIT-TENOFAF 150-150-200-10 MG PO TABS: 1.0000 | ORAL_TABLET | Freq: Every day | ORAL | Status: DC

## 2014-06-27 NOTE — Assessment & Plan Note (Signed)
She wants to change to change to [place]. Will give her genvoya. She is offered/refuses condoms. Will restart Hep B.  Will see her back in 3-4 months.

## 2014-06-27 NOTE — Progress Notes (Signed)
   Subjective:    Patient ID: Willaim [name], female    DOB: 18-Nov-1970, 44 y.o.   MRN: [id number]  HPI 44 yo F with hx of HIV+ (1998) and DM2 (gestational DM @ 44 yo). Had cone Bx 2013 and was told it was "abnormal but nothing to worry about".  Taking KLT/TRV. No other rx.  Is taking insulin for her DM now, left Dr Criss Rosales now seeing Dr Felicie Morn.  Has rash on her back- black spots, itches. Wants to see derm Had GI eval and colonoscopy last fall.  Had Gyn last fall, d/c?- due to metrorrhagia.  Getting tired of taking "that big kaletra pill". Wants to try [place].   HIV 1 RNA QUANT (copies/mL)  Date Value  06/11/2014 <20  10/23/2013 <20  11/20/2012 95*   CD4 T CELL ABS  Date Value  06/11/2014 760 /uL  10/23/2013 540 /uL  11/20/2012 450 cmm   Review of Systems  Constitutional: Negative for appetite change and unexpected weight change.  Gastrointestinal: Negative for diarrhea and constipation.  Genitourinary: Positive for menstrual problem. Negative for difficulty urinating.       Objective:   Physical Exam  Constitutional: She appears well-developed and well-nourished.  HENT:  Mouth/Throat: No oropharyngeal exudate.  Eyes: EOM are normal. Pupils are equal, round, and reactive to light.  Neck: Neck supple.  Cardiovascular: Normal rate, regular rhythm and normal heart sounds.   Pulmonary/Chest: Effort normal and breath sounds normal.  Abdominal: Soft. Bowel sounds are normal. She exhibits no distension. There is no tenderness.  Lymphadenopathy:    She has no cervical adenopathy.  Skin:             Assessment & Plan:

## 2014-06-27 NOTE — Addendum Note (Signed)
Addended by: Landis Gandy on: 06/27/2014 03:28 PM   Modules accepted: Orders

## 2014-06-27 NOTE — Assessment & Plan Note (Signed)
Greatly appreciate IM f/u.

## 2014-07-01 NOTE — Progress Notes (Signed)
Patient was seen on 06/27/14 for the third of a series of three diabetes self-management courses at the [facility]. The following learning objectives were met by the patient during this class:  . State the amount of activity recommended for healthy living . Describe activities suitable for individual needs . Identify ways to regularly incorporate activity into daily life . Identify barriers to activity and ways to over come these barriers  Identify diabetes medications being personally used and their primary action for lowering glucose and possible side effects . Describe role of stress on blood glucose and develop strategies to address psychosocial issues . Identify diabetes complications and ways to prevent them  Explain how to manage diabetes during illness . Evaluate success in meeting personal goal . Establish 2-3 goals that they will plan to diligently work on until they return for the  72-monthfollow-up visit  Goals:   I will count my carb choices at most meals and snacks  I will be active 30 minutes or more 3 times a week  I will take my diabetes medications as scheduled  Eat less carbs and less snacking  I will test my glucose at least 1 times a day, 7 days a week  I will look at patterns in my record book at least 3 days a month  To help manage stress I will  walk  When stressed I will send my kids to their sister's house  Your patient has identified these potential barriers to change:  Motivation  Your patient has identified their diabetes self-care support plan as  Magazine Subscriptions  Plan:  Attend Core 4 in 4 months

## 2014-07-08 ENCOUNTER — Ambulatory Visit: Payer: Self-pay | Admitting: Infectious Diseases

## 2014-07-29 ENCOUNTER — Ambulatory Visit: Payer: Self-pay

## 2014-07-30 ENCOUNTER — Ambulatory Visit (INDEPENDENT_AMBULATORY_CARE_PROVIDER_SITE_OTHER): Payer: Medicare Other | Admitting: *Deleted

## 2014-07-30 DIAGNOSIS — S63501A Unspecified sprain of right wrist, initial encounter: Secondary | ICD-10-CM | POA: Diagnosis not present

## 2014-07-30 DIAGNOSIS — Z23 Encounter for immunization: Secondary | ICD-10-CM

## 2014-08-08 DIAGNOSIS — S63501D Unspecified sprain of right wrist, subsequent encounter: Secondary | ICD-10-CM | POA: Diagnosis not present

## 2014-08-12 DIAGNOSIS — E1165 Type 2 diabetes mellitus with hyperglycemia: Secondary | ICD-10-CM | POA: Diagnosis not present

## 2014-08-12 DIAGNOSIS — M150 Primary generalized (osteo)arthritis: Secondary | ICD-10-CM | POA: Diagnosis not present

## 2014-08-14 DIAGNOSIS — L281 Prurigo nodularis: Secondary | ICD-10-CM | POA: Diagnosis not present

## 2014-08-14 DIAGNOSIS — L309 Dermatitis, unspecified: Secondary | ICD-10-CM | POA: Diagnosis not present

## 2014-08-16 DIAGNOSIS — M19031 Primary osteoarthritis, right wrist: Secondary | ICD-10-CM | POA: Diagnosis not present

## 2014-08-26 ENCOUNTER — Emergency Department (HOSPITAL_COMMUNITY)
Admission: EM | Admit: 2014-08-26 | Discharge: 2014-08-26 | Discharge status: Against Medical Advice | Payer: Medicare Other | Attending: Emergency Medicine | Admitting: Emergency Medicine

## 2014-08-26 ENCOUNTER — Encounter (HOSPITAL_COMMUNITY): Payer: Self-pay | Admitting: Family Medicine

## 2014-08-26 DIAGNOSIS — M179 Osteoarthritis of knee, unspecified: Secondary | ICD-10-CM | POA: Diagnosis not present

## 2014-08-26 DIAGNOSIS — L0202 Furuncle of face: Secondary | ICD-10-CM | POA: Insufficient documentation

## 2014-08-26 DIAGNOSIS — Z8619 Personal history of other infectious and parasitic diseases: Secondary | ICD-10-CM | POA: Diagnosis not present

## 2014-08-26 DIAGNOSIS — Z21 Asymptomatic human immunodeficiency virus [HIV] infection status: Secondary | ICD-10-CM | POA: Insufficient documentation

## 2014-08-26 DIAGNOSIS — Z8742 Personal history of other diseases of the female genital tract: Secondary | ICD-10-CM | POA: Insufficient documentation

## 2014-08-26 DIAGNOSIS — Z87828 Personal history of other (healed) physical injury and trauma: Secondary | ICD-10-CM | POA: Diagnosis not present

## 2014-08-26 DIAGNOSIS — E669 Obesity, unspecified: Secondary | ICD-10-CM | POA: Diagnosis not present

## 2014-08-26 DIAGNOSIS — L739 Follicular disorder, unspecified: Secondary | ICD-10-CM

## 2014-08-26 DIAGNOSIS — Z8719 Personal history of other diseases of the digestive system: Secondary | ICD-10-CM | POA: Insufficient documentation

## 2014-08-26 DIAGNOSIS — I10 Essential (primary) hypertension: Secondary | ICD-10-CM | POA: Insufficient documentation

## 2014-08-26 DIAGNOSIS — Z8669 Personal history of other diseases of the nervous system and sense organs: Secondary | ICD-10-CM | POA: Insufficient documentation

## 2014-08-26 DIAGNOSIS — Z79899 Other long term (current) drug therapy: Secondary | ICD-10-CM | POA: Insufficient documentation

## 2014-08-26 DIAGNOSIS — Z862 Personal history of diseases of the blood and blood-forming organs and certain disorders involving the immune mechanism: Secondary | ICD-10-CM | POA: Diagnosis not present

## 2014-08-26 DIAGNOSIS — E119 Type 2 diabetes mellitus without complications: Secondary | ICD-10-CM | POA: Diagnosis not present

## 2014-08-26 DIAGNOSIS — Z791 Long term (current) use of non-steroidal anti-inflammatories (NSAID): Secondary | ICD-10-CM | POA: Insufficient documentation

## 2014-08-26 DIAGNOSIS — L0201 Cutaneous abscess of face: Secondary | ICD-10-CM | POA: Diagnosis present

## 2014-08-26 MED ORDER — SULFAMETHOXAZOLE-TRIMETHOPRIM 800-160 MG PO TABS: 1.0000 | ORAL_TABLET | Freq: Once | ORAL | Status: DC

## 2014-08-26 MED ORDER — NAPROXEN 500 MG PO TABS
500.0000 mg | ORAL_TABLET | Freq: Two times a day (BID) | ORAL | Status: DC
Start: 2014-08-26 — End: 2015-08-16

## 2014-08-26 MED ORDER — NAPROXEN 250 MG PO TABS: 500.0000 mg | ORAL_TABLET | Freq: Once | ORAL | Status: DC

## 2014-08-26 MED ORDER — SULFAMETHOXAZOLE-TRIMETHOPRIM 800-160 MG PO TABS: 1.0000 | ORAL_TABLET | Freq: Two times a day (BID) | ORAL | Status: DC

## 2014-08-26 NOTE — ED Provider Notes (Signed)
CSN: [id number]     Arrival date & time 08/26/14  1619 History   First MD Initiated Contact with Patient 08/26/14 1902     Chief Complaint  Patient presents with  . Abscess     (Consider location/radiation/quality/duration/timing/severity/associated sxs/prior Treatment) Patient is a 44 y.o. female presenting with rash. The history is provided by the patient.  Rash Location:  Face Facial rash location:  Forehead and L cheek Quality: dryness, itchiness, painful and redness   Pain details:    Quality:  Aching   Severity:  Moderate   Onset quality:  Gradual   Timing:  Constant   Progression:  Worsening Severity:  Moderate Chronicity:  Recurrent Relieved by:  Nothing Ineffective treatments:  Antibiotic cream Associated symptoms: no diarrhea, no fever, no shortness of breath and not vomiting     Past Medical History  Diagnosis Date  . HIV disease   . Diabetes mellitus   . Hemorrhoids   . 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 Chronic folliculitis   . Iron deficiency anemia due to chronic blood loss   . Obesity   . Pneumothorax, traumatic   . Arthritis     knees  . Irregular menstrual cycle   . Migraines   . Hypertension    Past Surgical History  Procedure Laterality Date  . Hysteroscopy w/d&c  Removal of Endometrial Polyp    Dr. Raphael Gibney 2011  . Chest tube insertion    . Tubal ligation    . Laparoscopy N/A 01/10/2014    Procedure: LAPAROSCOPY OPERATIVE;  Surgeon: Ena Dawley, MD;  Location: [facility];  Service: Gynecology;  Laterality: N/A;  . Umbilical hernia repair  01/10/2014    Procedure: HERNIA REPAIR UMBILICAL ADULT;  Surgeon: Ena Dawley, MD;  Location: [facility];  Service: Gynecology;;  . Laparoscopic lysis of adhesions  01/10/2014    Procedure: LAPAROSCOPIC LYSIS OF ADHESIONS;  Surgeon: Ena Dawley, MD;  Location: [facility];  Service: Gynecology;;   Family History  Problem Relation Age of Onset  . Diabetes Mother   . Heart failure Father   . Diabetes Father    . Heart failure Brother   . Colon cancer      neg hx.   History  Substance Use Topics  . Smoking status: Never Smoker   . Smokeless tobacco: Never Used  . Alcohol Use: No   OB History    Gravida Para Term Preterm AB TAB SAB Ectopic Multiple Living   8 4 4  4  4   4      Review of Systems  Constitutional: Negative for fever.  Respiratory: Negative for shortness of breath.   Gastrointestinal: Negative for vomiting and diarrhea.  Skin: Positive for rash.  All other systems reviewed and are negative.     Allergies  Cortisone; Hydrocodone-acetaminophen; and Morphine  Home Medications   Prior to Admission medications   Medication Sig Start Date End Date Taking? Authorizing Provider  celecoxib (CELEBREX) 200 MG capsule Take 200 mg by mouth daily.   Yes Historical Provider, MD  elvitegravir-cobicistat-emtricitabine-tenofovir (GENVOYA) 150-150-200-10 MG TABS tablet Take 1 tablet by mouth daily with breakfast. 06/27/14  Yes Campbell Riches, MD  ibuprofen (ADVIL,MOTRIN) 800 MG tablet Take 1 tablet (800 mg total) by mouth every 8 (eight) hours as needed. 01/10/14  Yes Ena Dawley, MD  metFORMIN (GLUCOPHAGE-XR) 500 MG 24 hr tablet Take 500 mg by mouth 2 (two) times daily.  05/22/14  Yes Historical Provider, MD  permethrin (ELIMITE) 5 % cream Apply  1 application topically daily.  05/22/14  Yes Historical Provider, MD  promethazine (PHENERGAN) 25 MG tablet Take 25 mg by mouth every 6 (six) hours as needed for nausea or vomiting.  06/14/14  Yes Historical Provider, MD  temazepam (RESTORIL) 30 MG capsule Take 30 mg by mouth at bedtime.  10/11/13  Yes Historical Provider, MD  traMADol (ULTRAM) 50 MG tablet Take 2 tablets (100 mg total) by mouth every 6 (six) hours as needed for severe pain. 01/10/14  Yes Ena Dawley, MD  traZODone (DESYREL) 50 MG tablet Take 50 mg by mouth at bedtime.  06/14/14  Yes Historical Provider, MD  minocycline (MINOCIN,DYNACIN) 100 MG capsule Take 1 capsule (100 mg  total) by mouth 2 (two) times daily. Patient not taking: Reported on 08/26/2014 01/10/14   Ena Dawley, MD  naproxen (NAPROSYN) 500 MG tablet Take 1 tablet (500 mg total) by mouth 2 (two) times daily with a meal. 08/26/14   Larence Penning, MD  ondansetron (ZOFRAN ODT) 8 MG disintegrating tablet Take 1 tablet (8 mg total) by mouth every 8 (eight) hours as needed for nausea or vomiting. Patient not taking: Reported on 08/26/2014 07/27/13   Linton Flemings, MD  sulfamethoxazole-trimethoprim (BACTRIM DS,SEPTRA DS) 800-160 MG per tablet Take 1 tablet by mouth 2 (two) times daily. 08/26/14   Larence Penning, MD   BP 157/96 mmHg  Pulse 96  Temp(Src) 99.2 F (37.3 C) (Oral)  Resp 18  SpO2 100% Physical Exam  Constitutional: She appears well-developed and well-nourished. No distress.  HENT:  Head: Normocephalic and atraumatic.  Mouth/Throat: Oropharynx is clear and moist. No oropharyngeal exudate.  Folliculitis to left forehead and down the left cheek and left neck. Spot just above left eyebrow with excoriation and surrounding erythema but no underlying fluctuance or purulent drainage.  Eyes: Conjunctivae and EOM are normal. Pupils are equal, round, and reactive to light.  Neck: Neck supple.  Cardiovascular: Normal rate, regular rhythm, normal heart sounds and intact distal pulses.  Exam reveals no gallop and no friction rub.   No murmur heard. Pulmonary/Chest: Effort normal and breath sounds normal. No respiratory distress. She has no wheezes.  Musculoskeletal: Normal range of motion. She exhibits no edema or tenderness.  Lymphadenopathy:    She has no cervical adenopathy.  Skin: Skin is warm and dry. Rash noted. She is not diaphoretic.  Psychiatric: She has a normal mood and affect. Her behavior is normal. Judgment and thought content normal.    ED Course  Procedures (including critical care time) Labs Review Labs Reviewed - No data to display  Imaging Review No results found.   EKG  Interpretation None      MDM   Final diagnoses:  Folliculitis   44 year old female with history of HIV and folliculitis presents with concern for likely infected folliculitis on the left side of her face.   She has been trying antibiotic creams prescribed by dermatology without improvement. She has evidence of infection with erythema but no purulence and no abscess. Airway is patent and no intraoral abscess. Afebrile and vital signs stable. Given immunocompromise state and concern for infectious folliculitis. We'll treat with oral antibiotics. PCP and dermatology follow-up.  Patient left prior to receiving discharge paperwork.  Larence Penning, MD 08/26/14 2329  Leonard Schwartz, MD 08/31/14 [phone]

## 2014-08-26 NOTE — ED Notes (Signed)
Pt here for abscess to right eyebrow area. sts also pain and swelling in right side of face and neck. sts congestion.

## 2014-08-26 NOTE — ED Notes (Signed)
Pt. Up to nurses station stating she does not want to wait any longer and will come back at later date.States "I've been waiting 5 hours, I'm not going to wait any longer". Refused to wait for physician to come back in and talk with her.

## 2014-08-26 NOTE — ED Notes (Signed)
Patient walked past nurse first and would not talk to this nurse.

## 2014-08-26 NOTE — Discharge Instructions (Signed)

## 2014-08-26 NOTE — ED Notes (Signed)
Pt now reports right lower abdominal side pain.

## 2014-09-26 DIAGNOSIS — R739 Hyperglycemia, unspecified: Secondary | ICD-10-CM | POA: Diagnosis not present

## 2014-09-26 DIAGNOSIS — E119 Type 2 diabetes mellitus without complications: Secondary | ICD-10-CM | POA: Diagnosis not present

## 2014-09-26 DIAGNOSIS — M545 Low back pain: Secondary | ICD-10-CM | POA: Diagnosis not present

## 2014-10-17 ENCOUNTER — Other Ambulatory Visit: Payer: Self-pay

## 2014-10-24 DIAGNOSIS — F5101 Primary insomnia: Secondary | ICD-10-CM | POA: Diagnosis not present

## 2014-10-24 DIAGNOSIS — M150 Primary generalized (osteo)arthritis: Secondary | ICD-10-CM | POA: Diagnosis not present

## 2014-10-24 DIAGNOSIS — M545 Low back pain: Secondary | ICD-10-CM | POA: Diagnosis not present

## 2014-10-24 DIAGNOSIS — E1065 Type 1 diabetes mellitus with hyperglycemia: Secondary | ICD-10-CM | POA: Diagnosis not present

## 2014-10-28 DIAGNOSIS — I10 Essential (primary) hypertension: Secondary | ICD-10-CM | POA: Diagnosis not present

## 2014-10-28 DIAGNOSIS — G894 Chronic pain syndrome: Secondary | ICD-10-CM | POA: Diagnosis not present

## 2014-10-28 DIAGNOSIS — M129 Arthropathy, unspecified: Secondary | ICD-10-CM | POA: Diagnosis not present

## 2014-10-28 DIAGNOSIS — E119 Type 2 diabetes mellitus without complications: Secondary | ICD-10-CM | POA: Diagnosis not present

## 2014-11-05 DIAGNOSIS — M179 Osteoarthritis of knee, unspecified: Secondary | ICD-10-CM | POA: Diagnosis not present

## 2014-11-05 DIAGNOSIS — M25462 Effusion, left knee: Secondary | ICD-10-CM | POA: Diagnosis not present

## 2014-11-07 ENCOUNTER — Ambulatory Visit: Payer: Self-pay | Admitting: Infectious Diseases

## 2014-11-08 DIAGNOSIS — Z79891 Long term (current) use of opiate analgesic: Secondary | ICD-10-CM | POA: Diagnosis not present

## 2014-11-08 DIAGNOSIS — Z5181 Encounter for therapeutic drug level monitoring: Secondary | ICD-10-CM | POA: Diagnosis not present

## 2014-11-08 DIAGNOSIS — G894 Chronic pain syndrome: Secondary | ICD-10-CM | POA: Diagnosis not present

## 2014-11-08 DIAGNOSIS — I10 Essential (primary) hypertension: Secondary | ICD-10-CM | POA: Diagnosis not present

## 2014-11-14 DIAGNOSIS — M150 Primary generalized (osteo)arthritis: Secondary | ICD-10-CM | POA: Diagnosis not present

## 2014-11-14 DIAGNOSIS — D649 Anemia, unspecified: Secondary | ICD-10-CM | POA: Diagnosis not present

## 2014-11-14 DIAGNOSIS — E1065 Type 1 diabetes mellitus with hyperglycemia: Secondary | ICD-10-CM | POA: Diagnosis not present

## 2014-11-18 DIAGNOSIS — M545 Low back pain: Secondary | ICD-10-CM | POA: Diagnosis not present

## 2014-11-18 DIAGNOSIS — G894 Chronic pain syndrome: Secondary | ICD-10-CM | POA: Diagnosis not present

## 2014-11-18 DIAGNOSIS — M488X6 Other specified spondylopathies, lumbar region: Secondary | ICD-10-CM | POA: Diagnosis not present

## 2014-11-18 DIAGNOSIS — Z79899 Other long term (current) drug therapy: Secondary | ICD-10-CM | POA: Diagnosis not present

## 2014-11-18 DIAGNOSIS — E669 Obesity, unspecified: Secondary | ICD-10-CM | POA: Diagnosis not present

## 2014-11-21 DIAGNOSIS — M545 Low back pain: Secondary | ICD-10-CM | POA: Diagnosis not present

## 2014-11-21 DIAGNOSIS — E1065 Type 1 diabetes mellitus with hyperglycemia: Secondary | ICD-10-CM | POA: Diagnosis not present

## 2014-11-21 DIAGNOSIS — D500 Iron deficiency anemia secondary to blood loss (chronic): Secondary | ICD-10-CM | POA: Diagnosis not present

## 2014-11-27 DIAGNOSIS — M545 Low back pain: Secondary | ICD-10-CM | POA: Diagnosis not present

## 2014-11-27 DIAGNOSIS — E669 Obesity, unspecified: Secondary | ICD-10-CM | POA: Diagnosis not present

## 2014-11-27 DIAGNOSIS — Z79899 Other long term (current) drug therapy: Secondary | ICD-10-CM | POA: Diagnosis not present

## 2014-11-27 DIAGNOSIS — G894 Chronic pain syndrome: Secondary | ICD-10-CM | POA: Diagnosis not present

## 2014-11-27 DIAGNOSIS — M488X6 Other specified spondylopathies, lumbar region: Secondary | ICD-10-CM | POA: Diagnosis not present

## 2014-11-28 ENCOUNTER — Other Ambulatory Visit: Payer: Self-pay

## 2014-11-28 DIAGNOSIS — Z1231 Encounter for screening mammogram for malignant neoplasm of breast: Secondary | ICD-10-CM

## 2014-11-28 DIAGNOSIS — E0865 Diabetes mellitus due to underlying condition with hyperglycemia: Secondary | ICD-10-CM | POA: Diagnosis not present

## 2014-11-28 DIAGNOSIS — H538 Other visual disturbances: Secondary | ICD-10-CM | POA: Diagnosis not present

## 2014-11-28 DIAGNOSIS — H2511 Age-related nuclear cataract, right eye: Secondary | ICD-10-CM | POA: Diagnosis not present

## 2014-12-12 ENCOUNTER — Ambulatory Visit: Payer: Self-pay

## 2014-12-20 DIAGNOSIS — G894 Chronic pain syndrome: Secondary | ICD-10-CM | POA: Diagnosis not present

## 2014-12-20 DIAGNOSIS — Z5181 Encounter for therapeutic drug level monitoring: Secondary | ICD-10-CM | POA: Diagnosis not present

## 2014-12-20 DIAGNOSIS — G4700 Insomnia, unspecified: Secondary | ICD-10-CM | POA: Diagnosis not present

## 2014-12-20 DIAGNOSIS — Z79891 Long term (current) use of opiate analgesic: Secondary | ICD-10-CM | POA: Diagnosis not present

## 2014-12-24 DIAGNOSIS — Z79891 Long term (current) use of opiate analgesic: Secondary | ICD-10-CM | POA: Diagnosis not present

## 2014-12-26 ENCOUNTER — Ambulatory Visit: Payer: Self-pay

## 2015-01-10 DIAGNOSIS — I10 Essential (primary) hypertension: Secondary | ICD-10-CM | POA: Diagnosis not present

## 2015-01-10 DIAGNOSIS — Z5181 Encounter for therapeutic drug level monitoring: Secondary | ICD-10-CM | POA: Diagnosis not present

## 2015-01-10 DIAGNOSIS — G894 Chronic pain syndrome: Secondary | ICD-10-CM | POA: Diagnosis not present

## 2015-01-10 DIAGNOSIS — Z79891 Long term (current) use of opiate analgesic: Secondary | ICD-10-CM | POA: Diagnosis not present

## 2015-02-21 DIAGNOSIS — E119 Type 2 diabetes mellitus without complications: Secondary | ICD-10-CM | POA: Diagnosis not present

## 2015-02-21 DIAGNOSIS — E785 Hyperlipidemia, unspecified: Secondary | ICD-10-CM | POA: Diagnosis not present

## 2015-02-21 DIAGNOSIS — R5383 Other fatigue: Secondary | ICD-10-CM | POA: Diagnosis not present

## 2015-02-21 DIAGNOSIS — I10 Essential (primary) hypertension: Secondary | ICD-10-CM | POA: Diagnosis not present

## 2015-02-21 DIAGNOSIS — G894 Chronic pain syndrome: Secondary | ICD-10-CM | POA: Diagnosis not present

## 2015-03-20 DIAGNOSIS — G894 Chronic pain syndrome: Secondary | ICD-10-CM | POA: Diagnosis not present

## 2015-03-20 DIAGNOSIS — I10 Essential (primary) hypertension: Secondary | ICD-10-CM | POA: Diagnosis not present

## 2015-03-20 DIAGNOSIS — J209 Acute bronchitis, unspecified: Secondary | ICD-10-CM | POA: Diagnosis not present

## 2015-03-20 DIAGNOSIS — J029 Acute pharyngitis, unspecified: Secondary | ICD-10-CM | POA: Diagnosis not present

## 2015-03-27 DIAGNOSIS — G4700 Insomnia, unspecified: Secondary | ICD-10-CM | POA: Diagnosis not present

## 2015-03-27 DIAGNOSIS — E119 Type 2 diabetes mellitus without complications: Secondary | ICD-10-CM | POA: Diagnosis not present

## 2015-03-27 DIAGNOSIS — G894 Chronic pain syndrome: Secondary | ICD-10-CM | POA: Diagnosis not present

## 2015-04-18 DIAGNOSIS — E785 Hyperlipidemia, unspecified: Secondary | ICD-10-CM | POA: Diagnosis not present

## 2015-04-18 DIAGNOSIS — E119 Type 2 diabetes mellitus without complications: Secondary | ICD-10-CM | POA: Diagnosis not present

## 2015-04-18 DIAGNOSIS — J029 Acute pharyngitis, unspecified: Secondary | ICD-10-CM | POA: Diagnosis not present

## 2015-04-18 DIAGNOSIS — G894 Chronic pain syndrome: Secondary | ICD-10-CM | POA: Diagnosis not present

## 2015-04-18 DIAGNOSIS — M545 Low back pain: Secondary | ICD-10-CM | POA: Diagnosis not present

## 2015-05-23 DIAGNOSIS — E119 Type 2 diabetes mellitus without complications: Secondary | ICD-10-CM | POA: Diagnosis not present

## 2015-05-23 DIAGNOSIS — I10 Essential (primary) hypertension: Secondary | ICD-10-CM | POA: Diagnosis not present

## 2015-05-23 DIAGNOSIS — G894 Chronic pain syndrome: Secondary | ICD-10-CM | POA: Diagnosis not present

## 2015-05-23 DIAGNOSIS — R5383 Other fatigue: Secondary | ICD-10-CM | POA: Diagnosis not present

## 2015-05-23 DIAGNOSIS — G4700 Insomnia, unspecified: Secondary | ICD-10-CM | POA: Diagnosis not present

## 2015-05-29 ENCOUNTER — Other Ambulatory Visit: Payer: Medicare Other

## 2015-05-29 DIAGNOSIS — E1165 Type 2 diabetes mellitus with hyperglycemia: Secondary | ICD-10-CM

## 2015-05-29 DIAGNOSIS — IMO0002 Reserved for concepts with insufficient information to code with codable children: Secondary | ICD-10-CM

## 2015-05-29 DIAGNOSIS — Z79899 Other long term (current) drug therapy: Secondary | ICD-10-CM | POA: Diagnosis not present

## 2015-05-29 DIAGNOSIS — E119 Type 2 diabetes mellitus without complications: Secondary | ICD-10-CM | POA: Diagnosis not present

## 2015-05-29 DIAGNOSIS — Z794 Long term (current) use of insulin: Secondary | ICD-10-CM

## 2015-05-29 DIAGNOSIS — B20 Human immunodeficiency virus [HIV] disease: Secondary | ICD-10-CM | POA: Diagnosis not present

## 2015-05-29 DIAGNOSIS — Z113 Encounter for screening for infections with a predominantly sexual mode of transmission: Secondary | ICD-10-CM

## 2015-05-29 LAB — COMPLETE METABOLIC PANEL WITH GFR
ALBUMIN: 4.1 g/dL (ref 3.6–5.1)
ALK PHOS: 96 U/L (ref 33–115)
ALT: 11 U/L (ref 6–29)
AST: 12 U/L (ref 10–35)
BILIRUBIN TOTAL: 0.2 mg/dL (ref 0.2–1.2)
BUN: 18 mg/dL (ref 7–25)
CALCIUM: 9.2 mg/dL (ref 8.6–10.2)
CO2: 25 mmol/L (ref 20–31)
CREATININE: 1.01 mg/dL (ref 0.50–1.10)
Chloride: 100 mmol/L (ref 98–110)
GFR, Est African American: 77 mL/min (ref >=60)
GFR, Est Non African American: 67 mL/min (ref >=60)
GLUCOSE: 236 mg/dL — AB (ref 65–99)
Potassium: 4.1 mmol/L (ref 3.5–5.3)
SODIUM: 135 mmol/L (ref 135–146)
TOTAL PROTEIN: 7.9 g/dL (ref 6.1–8.1)

## 2015-05-29 LAB — LIPID PANEL
CHOLESTEROL: 211 mg/dL — AB (ref 125–200)
HDL: 48 mg/dL (ref >=46)
LDL Cholesterol: 122 mg/dL (ref <130)
Total CHOL/HDL Ratio: 4.4 Ratio (ref <=5.0)
Triglycerides: 203 mg/dL — ABNORMAL HIGH (ref <150)
VLDL: 41 mg/dL — ABNORMAL HIGH (ref <30)

## 2015-05-29 LAB — CBC
HEMATOCRIT: 27.7 % — AB (ref 36.0–46.0)
HEMOGLOBIN: 7.9 g/dL — AB (ref 12.0–15.0)
MCH: 17.8 pg — ABNORMAL LOW (ref 26.0–34.0)
MCHC: 28.5 g/dL — ABNORMAL LOW (ref 30.0–36.0)
MCV: 62.2 fL — ABNORMAL LOW (ref 78.0–100.0)
MPV: 8.5 fL — ABNORMAL LOW (ref 8.6–12.4)
Platelets: 442 10*3/uL — ABNORMAL HIGH (ref 150–400)
RBC: 4.45 MIL/uL (ref 3.87–5.11)
RDW: 18.6 % — ABNORMAL HIGH (ref 11.5–15.5)
WBC: 7.2 10*3/uL (ref 4.0–10.5)

## 2015-05-30 LAB — HIV-1 RNA QUANT-NO REFLEX-BLD
HIV 1 RNA Quant: <20 copies/mL (ref <20)
HIV-1 RNA Quant, Log: <1.3 Log copies/mL (ref <1.30)

## 2015-05-30 LAB — T-HELPER CELL (CD4) - (RCID CLINIC ONLY)
CD4 T CELL ABS: 680 /uL (ref 400–2700)
CD4 T CELL HELPER: 33 % (ref 33–55)

## 2015-05-30 LAB — RPR

## 2015-06-18 ENCOUNTER — Ambulatory Visit: Payer: Self-pay | Admitting: Infectious Diseases

## 2015-06-28 DIAGNOSIS — M1990 Unspecified osteoarthritis, unspecified site: Secondary | ICD-10-CM | POA: Diagnosis not present

## 2015-06-28 DIAGNOSIS — G894 Chronic pain syndrome: Secondary | ICD-10-CM | POA: Diagnosis not present

## 2015-06-28 DIAGNOSIS — G4700 Insomnia, unspecified: Secondary | ICD-10-CM | POA: Diagnosis not present

## 2015-06-28 DIAGNOSIS — I10 Essential (primary) hypertension: Secondary | ICD-10-CM | POA: Diagnosis not present

## 2015-07-02 DIAGNOSIS — Z79899 Other long term (current) drug therapy: Secondary | ICD-10-CM | POA: Diagnosis not present

## 2015-07-05 DIAGNOSIS — I10 Essential (primary) hypertension: Secondary | ICD-10-CM | POA: Diagnosis not present

## 2015-07-05 DIAGNOSIS — E119 Type 2 diabetes mellitus without complications: Secondary | ICD-10-CM | POA: Diagnosis not present

## 2015-07-05 DIAGNOSIS — J209 Acute bronchitis, unspecified: Secondary | ICD-10-CM | POA: Diagnosis not present

## 2015-07-05 DIAGNOSIS — J029 Acute pharyngitis, unspecified: Secondary | ICD-10-CM | POA: Diagnosis not present

## 2015-07-28 ENCOUNTER — Telehealth: Payer: Self-pay | Admitting: *Deleted

## 2015-07-28 DIAGNOSIS — E119 Type 2 diabetes mellitus without complications: Secondary | ICD-10-CM | POA: Diagnosis not present

## 2015-07-28 DIAGNOSIS — G4700 Insomnia, unspecified: Secondary | ICD-10-CM | POA: Diagnosis not present

## 2015-07-28 DIAGNOSIS — G894 Chronic pain syndrome: Secondary | ICD-10-CM | POA: Diagnosis not present

## 2015-07-28 DIAGNOSIS — I10 Essential (primary) hypertension: Secondary | ICD-10-CM | POA: Diagnosis not present

## 2015-07-28 NOTE — Telephone Encounter (Signed)
Patient called stating she is out of her HIV meds, has not come to an appt in over a year and is scheduled to see Dr. Johnnye Sima on Wednesday, 07/30/15. She has had two previous no shows and multiple cancellations; advised her to keep appt and Dr. Johnnye Sima would refill her meds that day. Myrtis Hopping

## 2015-07-30 ENCOUNTER — Encounter: Payer: Self-pay | Admitting: Infectious Diseases

## 2015-07-30 ENCOUNTER — Other Ambulatory Visit (HOSPITAL_COMMUNITY)
Admission: RE | Admit: 2015-07-30 | Discharge: 2015-07-30 | Disposition: A | Discharge status: Home / Self Care | Payer: Medicare Other | Source: Ambulatory Visit | Attending: Infectious Diseases | Admitting: Infectious Diseases

## 2015-07-30 ENCOUNTER — Ambulatory Visit (INDEPENDENT_AMBULATORY_CARE_PROVIDER_SITE_OTHER): Payer: Medicare Other | Admitting: Infectious Diseases

## 2015-07-30 VITALS — BP 135/85 | HR 99 | Temp 97.7°F | Ht 69.0 in | Wt 232.8 lb

## 2015-07-30 DIAGNOSIS — Z113 Encounter for screening for infections with a predominantly sexual mode of transmission: Secondary | ICD-10-CM | POA: Insufficient documentation

## 2015-07-30 DIAGNOSIS — N920 Excessive and frequent menstruation with regular cycle: Secondary | ICD-10-CM

## 2015-07-30 DIAGNOSIS — Z79899 Other long term (current) drug therapy: Secondary | ICD-10-CM | POA: Diagnosis not present

## 2015-07-30 DIAGNOSIS — Z23 Encounter for immunization: Secondary | ICD-10-CM

## 2015-07-30 DIAGNOSIS — B20 Human immunodeficiency virus [HIV] disease: Secondary | ICD-10-CM | POA: Diagnosis not present

## 2015-07-30 DIAGNOSIS — Z794 Long term (current) use of insulin: Secondary | ICD-10-CM

## 2015-07-30 DIAGNOSIS — E1165 Type 2 diabetes mellitus with hyperglycemia: Secondary | ICD-10-CM | POA: Diagnosis not present

## 2015-07-30 DIAGNOSIS — G43009 Migraine without aura, not intractable, without status migrainosus: Secondary | ICD-10-CM

## 2015-07-30 DIAGNOSIS — IMO0002 Reserved for concepts with insufficient information to code with codable children: Secondary | ICD-10-CM

## 2015-07-30 LAB — COMPREHENSIVE METABOLIC PANEL
ALBUMIN: 4.1 g/dL (ref 3.6–5.1)
ALT: 9 U/L (ref 6–29)
AST: 14 U/L (ref 10–35)
Alkaline Phosphatase: 112 U/L (ref 33–115)
BUN: 15 mg/dL (ref 7–25)
CALCIUM: 9.2 mg/dL (ref 8.6–10.2)
CHLORIDE: 98 mmol/L (ref 98–110)
CO2: 26 mmol/L (ref 20–31)
Creat: 0.99 mg/dL (ref 0.50–1.10)
Glucose, Bld: 464 mg/dL — ABNORMAL HIGH (ref 65–99)
POTASSIUM: 4.3 mmol/L (ref 3.5–5.3)
Sodium: 133 mmol/L — ABNORMAL LOW (ref 135–146)
Total Bilirubin: 0.2 mg/dL (ref 0.2–1.2)
Total Protein: 8.1 g/dL (ref 6.1–8.1)

## 2015-07-30 LAB — CBC
HCT: 27 % — ABNORMAL LOW (ref 36.0–46.0)
HEMOGLOBIN: 7.3 g/dL — AB (ref 12.0–15.0)
MCH: 16.5 pg — AB (ref 26.0–34.0)
MCHC: 27 g/dL — ABNORMAL LOW (ref 30.0–36.0)
MCV: 61.1 fL — ABNORMAL LOW (ref 78.0–100.0)
MPV: 9.4 fL (ref 8.6–12.4)
Platelets: 437 10*3/uL — ABNORMAL HIGH (ref 150–400)
RBC: 4.42 MIL/uL (ref 3.87–5.11)
RDW: 17.5 % — ABNORMAL HIGH (ref 11.5–15.5)
WBC: 5.7 10*3/uL (ref 4.0–10.5)

## 2015-07-30 LAB — LIPID PANEL
CHOL/HDL RATIO: 5.5 ratio — AB (ref <=5.0)
CHOLESTEROL: 210 mg/dL — AB (ref 125–200)
HDL: 38 mg/dL — AB (ref >=46)
LDL Cholesterol: 130 mg/dL — ABNORMAL HIGH (ref <130)
Triglycerides: 208 mg/dL — ABNORMAL HIGH (ref <150)
VLDL: 42 mg/dL — AB (ref <30)

## 2015-07-30 MED ORDER — ELVITEG-COBIC-EMTRICIT-TENOFAF 150-150-200-10 MG PO TABS: 1.0000 | ORAL_TABLET | Freq: Every day | ORAL | Status: DC

## 2015-07-30 MED ORDER — SUMATRIPTAN SUCCINATE 100 MG PO TABS: 100.0000 mg | ORAL_TABLET | ORAL | Status: DC | PRN

## 2015-07-30 NOTE — Progress Notes (Signed)
   Subjective:    Patient ID: Brenda [name], female    DOB: 1970-12-03, 45 y.o.   MRN: [id number]  HPI 45 yo F with hx of DM2 and HIV+. She had only ever received KLT/TRV until being changed to genvoya 05-2015.  Today she complains of headache. She states it feels like one of her migraines. Had nausea over last 24h. Saw her PCP yesterday, was given vicodin. Did not help. Wants percocet. Has not prev taken fioricet. Never taken imitrex.   No problems with genvoya.  FSG have been "normal". Has been taking her meds, insulin.   HIV 1 RNA QUANT (copies/mL)  Date Value  05/29/2015 <20  06/11/2014 <20  10/23/2013 <20   CD4 T CELL ABS (/uL)  Date Value  05/29/2015 680  06/11/2014 760  10/23/2013 540   No foot sores, has not seen ophtho this year.  Can't remember last PAP. Menses are 4x/year, heavy, since lap with lysis of adhesions (01-2014).   Review of Systems  Constitutional: Negative for appetite change and unexpected weight change.  Eyes: Positive for photophobia. Negative for visual disturbance.  Gastrointestinal: Positive for nausea. Negative for diarrhea and constipation.  Genitourinary: Positive for frequency. Negative for difficulty urinating.  Neurological: Positive for headaches.       Objective:   Physical Exam  Constitutional: She appears well-developed and well-nourished.  HENT:  Mouth/Throat: No oropharyngeal exudate.  Eyes: EOM are normal. Pupils are equal, round, and reactive to light.  Neck: Neck supple.  Cardiovascular: Normal rate, regular rhythm and normal heart sounds.   Pulmonary/Chest: Effort normal and breath sounds normal.  Lymphadenopathy:    She has no cervical adenopathy.       Assessment & Plan:

## 2015-07-30 NOTE — Assessment & Plan Note (Signed)
Well controlled by her hx

## 2015-07-30 NOTE — Assessment & Plan Note (Signed)
Will have her seen by GYN.  

## 2015-07-30 NOTE — Assessment & Plan Note (Signed)
Appears to be doing well  Will refill her rx Given condoms Refuses fly Get her with GYN

## 2015-07-30 NOTE — Assessment & Plan Note (Signed)
Will give her rx for imitrex Could also consider fioricet.  She will f/u with PCP

## 2015-07-31 ENCOUNTER — Telehealth: Payer: Self-pay

## 2015-07-31 ENCOUNTER — Telehealth: Payer: Self-pay | Admitting: *Deleted

## 2015-07-31 LAB — HIV-1 RNA QUANT-NO REFLEX-BLD: HIV 1 RNA Quant: <20 copies/mL (ref <20)

## 2015-07-31 LAB — T-HELPER CELL (CD4) - (RCID CLINIC ONLY)
CD4 T CELL ABS: 570 /uL (ref 400–2700)
CD4 T CELL HELPER: 31 % — AB (ref 33–55)

## 2015-07-31 LAB — RPR

## 2015-07-31 LAB — URINE CYTOLOGY ANCILLARY ONLY
Chlamydia: NEGATIVE
Neisseria Gonorrhea: NEGATIVE

## 2015-07-31 NOTE — Telephone Encounter (Signed)
Lab is calling with an alert glucose of 464 from labs drawn on 07-30-15.  She is a known insulin dependent diabetic I attempted to call the patient and her mail box is full .   I will inform the physician and try to contact the patient later.    Laverle Patter, RN

## 2015-07-31 NOTE — Telephone Encounter (Signed)
RN contacted the patient after introducing myself I explained to the patient the reason for my call. RN offered the patient the services of a Insurance risk surveyor. Patient stated right now she is living with her dtr and asked if I could help someone with housing that has a felony.  RN explained to the patient that it may be a challenge but I could link her with some people that could help. [name] stated it would not be for her but for a friend of hers that needs the help. Ms Brenda [name] stated she does not need my services but will save my number just in case she changes her mind.

## 2015-07-31 NOTE — Progress Notes (Signed)
Pt informed that [facility] Nurse would be calling to make an appointment with the pt.  Pt verbalized understanding.

## 2015-07-31 NOTE — Telephone Encounter (Signed)
Pt had missed a few days of her DM medications.  She stated that she was restarting her DM medications.

## 2015-07-31 NOTE — Addendum Note (Signed)
Addended by: Lorne Skeens D on: 07/31/2015 10:51 AM   Modules accepted: Orders

## 2015-07-31 NOTE — Telephone Encounter (Signed)
Received patient's voice mail and left message for her call regarding lab results. She called back and I did tell her that her glucose was high and asked if she was still seeing her primary care. She said she changed PCP and then the call was dropped. If patient returns the call she should give [place] her new primary care MD and call them regarding high glucose. Myrtis Hopping

## 2015-08-08 ENCOUNTER — Ambulatory Visit: Payer: Self-pay

## 2015-08-14 ENCOUNTER — Observation Stay (HOSPITAL_COMMUNITY)
Admission: EM | Admit: 2015-08-14 | Discharge: 2015-08-16 | Disposition: A | Discharge status: Home / Self Care | Payer: Medicare Other | Attending: Internal Medicine | Admitting: Internal Medicine

## 2015-08-14 ENCOUNTER — Observation Stay (HOSPITAL_COMMUNITY): Payer: Medicare Other

## 2015-08-14 ENCOUNTER — Emergency Department (HOSPITAL_COMMUNITY): Payer: Medicare Other

## 2015-08-14 ENCOUNTER — Encounter (HOSPITAL_COMMUNITY): Payer: Self-pay | Admitting: *Deleted

## 2015-08-14 DIAGNOSIS — E861 Hypovolemia: Secondary | ICD-10-CM | POA: Diagnosis not present

## 2015-08-14 DIAGNOSIS — D500 Iron deficiency anemia secondary to blood loss (chronic): Secondary | ICD-10-CM | POA: Insufficient documentation

## 2015-08-14 DIAGNOSIS — R059 Cough, unspecified: Secondary | ICD-10-CM

## 2015-08-14 DIAGNOSIS — IMO0002 Reserved for concepts with insufficient information to code with codable children: Secondary | ICD-10-CM

## 2015-08-14 DIAGNOSIS — F419 Anxiety disorder, unspecified: Secondary | ICD-10-CM | POA: Diagnosis not present

## 2015-08-14 DIAGNOSIS — R404 Transient alteration of awareness: Secondary | ICD-10-CM | POA: Diagnosis not present

## 2015-08-14 DIAGNOSIS — R519 Headache, unspecified: Secondary | ICD-10-CM

## 2015-08-14 DIAGNOSIS — E1165 Type 2 diabetes mellitus with hyperglycemia: Secondary | ICD-10-CM | POA: Diagnosis not present

## 2015-08-14 DIAGNOSIS — D649 Anemia, unspecified: Secondary | ICD-10-CM

## 2015-08-14 DIAGNOSIS — E119 Type 2 diabetes mellitus without complications: Secondary | ICD-10-CM

## 2015-08-14 DIAGNOSIS — Z6837 Body mass index (BMI) 37.0-37.9, adult: Secondary | ICD-10-CM | POA: Insufficient documentation

## 2015-08-14 DIAGNOSIS — I951 Orthostatic hypotension: Secondary | ICD-10-CM | POA: Diagnosis not present

## 2015-08-14 DIAGNOSIS — R197 Diarrhea, unspecified: Secondary | ICD-10-CM

## 2015-08-14 DIAGNOSIS — R200 Anesthesia of skin: Secondary | ICD-10-CM

## 2015-08-14 DIAGNOSIS — R51 Headache: Secondary | ICD-10-CM

## 2015-08-14 DIAGNOSIS — B20 Human immunodeficiency virus [HIV] disease: Secondary | ICD-10-CM | POA: Diagnosis not present

## 2015-08-14 DIAGNOSIS — Z794 Long term (current) use of insulin: Secondary | ICD-10-CM | POA: Insufficient documentation

## 2015-08-14 DIAGNOSIS — G43909 Migraine, unspecified, not intractable, without status migrainosus: Secondary | ICD-10-CM | POA: Diagnosis present

## 2015-08-14 DIAGNOSIS — M170 Bilateral primary osteoarthritis of knee: Secondary | ICD-10-CM | POA: Insufficient documentation

## 2015-08-14 DIAGNOSIS — E669 Obesity, unspecified: Secondary | ICD-10-CM | POA: Insufficient documentation

## 2015-08-14 DIAGNOSIS — W19XXXA Unspecified fall, initial encounter: Secondary | ICD-10-CM

## 2015-08-14 DIAGNOSIS — R55 Syncope and collapse: Secondary | ICD-10-CM | POA: Diagnosis not present

## 2015-08-14 DIAGNOSIS — Z79899 Other long term (current) drug therapy: Secondary | ICD-10-CM | POA: Diagnosis not present

## 2015-08-14 DIAGNOSIS — J111 Influenza due to unidentified influenza virus with other respiratory manifestations: Secondary | ICD-10-CM | POA: Diagnosis not present

## 2015-08-14 DIAGNOSIS — R209 Unspecified disturbances of skin sensation: Secondary | ICD-10-CM | POA: Diagnosis not present

## 2015-08-14 DIAGNOSIS — R05 Cough: Secondary | ICD-10-CM

## 2015-08-14 DIAGNOSIS — I10 Essential (primary) hypertension: Secondary | ICD-10-CM | POA: Diagnosis not present

## 2015-08-14 DIAGNOSIS — R1011 Right upper quadrant pain: Secondary | ICD-10-CM | POA: Diagnosis present

## 2015-08-14 DIAGNOSIS — R6889 Other general symptoms and signs: Secondary | ICD-10-CM | POA: Diagnosis not present

## 2015-08-14 LAB — URINALYSIS, ROUTINE W REFLEX MICROSCOPIC
BILIRUBIN URINE: NEGATIVE
Glucose, UA: NEGATIVE mg/dL
Hgb urine dipstick: NEGATIVE
KETONES UR: NEGATIVE mg/dL
Leukocytes, UA: NEGATIVE
NITRITE: NEGATIVE
PH: 5 (ref 5.0–8.0)
PROTEIN: NEGATIVE mg/dL
Specific Gravity, Urine: 1.009 (ref 1.005–1.030)

## 2015-08-14 LAB — CBC
HCT: 24.9 % — ABNORMAL LOW (ref 36.0–46.0)
Hemoglobin: 7.1 g/dL — ABNORMAL LOW (ref 12.0–15.0)
MCH: 17 pg — ABNORMAL LOW (ref 26.0–34.0)
MCHC: 28.5 g/dL — AB (ref 30.0–36.0)
MCV: 59.7 fL — ABNORMAL LOW (ref 78.0–100.0)
Platelets: 392 10*3/uL (ref 150–400)
RBC: 4.17 MIL/uL (ref 3.87–5.11)
RDW: 18 % — AB (ref 11.5–15.5)
WBC: 5.6 10*3/uL (ref 4.0–10.5)

## 2015-08-14 LAB — BASIC METABOLIC PANEL
ANION GAP: 8 (ref 5–15)
BUN: 13 mg/dL (ref 6–20)
CO2: 24 mmol/L (ref 22–32)
Calcium: 8.4 mg/dL — ABNORMAL LOW (ref 8.9–10.3)
Chloride: 104 mmol/L (ref 101–111)
Creatinine, Ser: 1.21 mg/dL — ABNORMAL HIGH (ref 0.44–1.00)
GFR calc Af Amer: >60 mL/min (ref >60)
GFR, EST NON AFRICAN AMERICAN: 53 mL/min — AB (ref >60)
Glucose, Bld: 149 mg/dL — ABNORMAL HIGH (ref 65–99)
Potassium: 3.7 mmol/L (ref 3.5–5.1)
Sodium: 136 mmol/L (ref 135–145)

## 2015-08-14 LAB — PREPARE RBC (CROSSMATCH)

## 2015-08-14 LAB — I-STAT TROPONIN, ED: Troponin i, poc: 0 ng/mL (ref 0.00–0.08)

## 2015-08-14 LAB — HEPATIC FUNCTION PANEL
ALBUMIN: 3.3 g/dL — AB (ref 3.5–5.0)
ALK PHOS: 71 U/L (ref 38–126)
ALT: 13 U/L — AB (ref 14–54)
AST: 22 U/L (ref 15–41)
BILIRUBIN TOTAL: 0.2 mg/dL — AB (ref 0.3–1.2)
Total Protein: 6.8 g/dL (ref 6.5–8.1)

## 2015-08-14 LAB — GLUCOSE, CAPILLARY: Glucose-Capillary: 161 mg/dL — ABNORMAL HIGH (ref 65–99)

## 2015-08-14 LAB — ABO/RH: ABO/RH(D): O POS

## 2015-08-14 LAB — I-STAT BETA HCG BLOOD, ED (MC, WL, AP ONLY): I-stat hCG, quantitative: <5 m[IU]/mL (ref <5)

## 2015-08-14 LAB — LIPASE, BLOOD: Lipase: 44 U/L (ref 11–51)

## 2015-08-14 LAB — I-STAT CG4 LACTIC ACID, ED: LACTIC ACID, VENOUS: 1.1 mmol/L (ref 0.5–2.0)

## 2015-08-14 MED ORDER — ELVITEG-COBIC-EMTRICIT-TENOFAF 150-150-200-10 MG PO TABS
1.0000 | ORAL_TABLET | Freq: Every day | ORAL | Status: DC
  Administered 2015-08-15: 1 via ORAL
  Filled 2015-08-14 (×2): qty 1

## 2015-08-14 MED ORDER — INSULIN ASPART 100 UNIT/ML ~~LOC~~ SOLN
0.0000 [IU] | Freq: Three times a day (TID) | SUBCUTANEOUS | Status: DC
  Administered 2015-08-15: 5 [IU] via SUBCUTANEOUS
  Administered 2015-08-16: 3 [IU] via SUBCUTANEOUS

## 2015-08-14 MED ORDER — INSULIN GLARGINE 100 UNIT/ML ~~LOC~~ SOLN
15.0000 [IU] | Freq: Every day | SUBCUTANEOUS | Status: DC
  Administered 2015-08-15 – 2015-08-16 (×2): 15 [IU] via SUBCUTANEOUS
  Filled 2015-08-14 (×2): qty 0.15

## 2015-08-14 MED ORDER — SODIUM CHLORIDE 0.9 % IV BOLUS (SEPSIS)
1000.0000 mL | Freq: Once | INTRAVENOUS | Status: AC
  Administered 2015-08-14: 1000 mL via INTRAVENOUS

## 2015-08-14 MED ORDER — ACETAMINOPHEN 325 MG PO TABS
650.0000 mg | ORAL_TABLET | Freq: Four times a day (QID) | ORAL | Status: DC | PRN
  Administered 2015-08-14: 650 mg via ORAL
  Filled 2015-08-14: qty 2

## 2015-08-14 MED ORDER — INSULIN ASPART 100 UNIT/ML ~~LOC~~ SOLN
0.0000 [IU] | Freq: Every day | SUBCUTANEOUS | Status: DC
  Administered 2015-08-15: 2 [IU] via SUBCUTANEOUS

## 2015-08-14 MED ORDER — TRAZODONE HCL 50 MG PO TABS
50.0000 mg | ORAL_TABLET | Freq: Every day | ORAL | Status: DC
  Administered 2015-08-14: 50 mg via ORAL
  Filled 2015-08-14 (×2): qty 1

## 2015-08-14 MED ORDER — ONDANSETRON HCL 4 MG/2ML IJ SOLN: 4.0000 mg | Freq: Three times a day (TID) | INTRAMUSCULAR | Status: DC | PRN

## 2015-08-14 MED ORDER — TRAMADOL HCL 50 MG PO TABS
50.0000 mg | ORAL_TABLET | Freq: Four times a day (QID) | ORAL | Status: DC | PRN
  Administered 2015-08-15 – 2015-08-16 (×4): 50 mg via ORAL
  Filled 2015-08-14 (×4): qty 1

## 2015-08-14 MED ORDER — SODIUM CHLORIDE 0.9 % IV SOLN
INTRAVENOUS | Status: DC
  Administered 2015-08-14 – 2015-08-16 (×2): via INTRAVENOUS

## 2015-08-14 MED ORDER — SODIUM CHLORIDE 0.9 % IV SOLN
INTRAVENOUS | Status: DC
  Administered 2015-08-14: 18:00:00 via INTRAVENOUS

## 2015-08-14 MED ORDER — ONDANSETRON HCL 4 MG PO TABS: 4.0000 mg | ORAL_TABLET | Freq: Four times a day (QID) | ORAL | Status: DC | PRN

## 2015-08-14 MED ORDER — SODIUM CHLORIDE 0.9 % IV SOLN
10.0000 mL/h | Freq: Once | INTRAVENOUS | Status: AC
  Administered 2015-08-14: 10 mL/h via INTRAVENOUS

## 2015-08-14 MED ORDER — OSELTAMIVIR PHOSPHATE 75 MG PO CAPS
75.0000 mg | ORAL_CAPSULE | Freq: Two times a day (BID) | ORAL | Status: DC
  Administered 2015-08-14 – 2015-08-16 (×4): 75 mg via ORAL
  Filled 2015-08-14 (×7): qty 1

## 2015-08-14 MED ORDER — SODIUM CHLORIDE 0.9% FLUSH
3.0000 mL | Freq: Two times a day (BID) | INTRAVENOUS | Status: DC
  Administered 2015-08-16: 3 mL via INTRAVENOUS

## 2015-08-14 MED ORDER — ONDANSETRON HCL 4 MG/2ML IJ SOLN
4.0000 mg | Freq: Once | INTRAMUSCULAR | Status: AC
  Administered 2015-08-14: 4 mg via INTRAVENOUS
  Filled 2015-08-14: qty 2

## 2015-08-14 MED ORDER — ACETAMINOPHEN 650 MG RE SUPP: 650.0000 mg | Freq: Four times a day (QID) | RECTAL | Status: DC | PRN

## 2015-08-14 MED ORDER — ONDANSETRON HCL 4 MG/2ML IJ SOLN
4.0000 mg | Freq: Four times a day (QID) | INTRAMUSCULAR | Status: DC | PRN
  Administered 2015-08-15: 4 mg via INTRAVENOUS
  Filled 2015-08-14: qty 2

## 2015-08-14 NOTE — ED Notes (Signed)
Patient transported to X-ray 

## 2015-08-14 NOTE — ED Notes (Signed)
MD at bedside. 

## 2015-08-14 NOTE — ED Notes (Signed)
Patient transported to CT 

## 2015-08-14 NOTE — ED Provider Notes (Signed)
CSN: [id number]     Arrival date & time 08/14/15  1415 History   First MD Initiated Contact with Patient 08/14/15 1419     Chief Complaint  Patient presents with  . Loss of Consciousness  . Influenza     HPI  Expand All Collapse All   Pt presents via [facility] from home after a syncopal episode. Pt was in kitchen and states the next thing she was in the floor, crawled to the bedroom and layed down, mother called EMS. Pt dx with flu x 1 day ago, took 1 day of Tamiflu, hx: HIV. BP intially 76 palp, alert throughout. Denies injury from fall, no neck or back pain. Pt reports generalized weakness and fatigue. Pt received 200cc NS en route, BP 118/63 on arrival.        Past Medical History  Diagnosis Date  . HIV disease ([place])   . Diabetes mellitus   . Hemorrhoids   . 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 Chronic folliculitis   . Iron deficiency anemia due to chronic blood loss   . Obesity   . Pneumothorax, traumatic   . Arthritis     knees  . Irregular menstrual cycle   . Migraines   . Hypertension    Past Surgical History  Procedure Laterality Date  . Hysteroscopy w/d&c  Removal of Endometrial Polyp    Dr. Raphael Gibney 2011  . Chest tube insertion    . Tubal ligation    . Laparoscopy N/A 01/10/2014    Procedure: LAPAROSCOPY OPERATIVE;  Surgeon: Ena Dawley, MD;  Location: [facility];  Service: Gynecology;  Laterality: N/A;  . Umbilical hernia repair  01/10/2014    Procedure: HERNIA REPAIR UMBILICAL ADULT;  Surgeon: Ena Dawley, MD;  Location: [facility];  Service: Gynecology;;  . Laparoscopic lysis of adhesions  01/10/2014    Procedure: LAPAROSCOPIC LYSIS OF ADHESIONS;  Surgeon: Ena Dawley, MD;  Location: [facility];  Service: Gynecology;;   Family History  Problem Relation Age of Onset  . Diabetes Mother   . Heart failure Father   . Diabetes Father   . Heart failure Brother   . Colon cancer      neg hx.   Social History  Substance Use Topics  . Smoking status: Never  Smoker   . Smokeless tobacco: Never Used  . Alcohol Use: No   OB History    Gravida Para Term Preterm AB TAB SAB Ectopic Multiple Living   8 4 4  4  4   4      Review of Systems  Constitutional: Positive for fever.  All other systems reviewed and are negative.     Allergies  Cortisone; Hydrocodone-acetaminophen; and Morphine  Home Medications   Prior to Admission medications   Medication Sig Start Date End Date Taking? Authorizing Provider  elvitegravir-cobicistat-emtricitabine-tenofovir (GENVOYA) 150-150-200-10 MG TABS tablet Take 1 tablet by mouth daily with breakfast. 07/30/15  Yes Campbell Riches, MD  ibuprofen (ADVIL,MOTRIN) 800 MG tablet Take 1 tablet (800 mg total) by mouth every 8 (eight) hours as needed. Patient taking differently: Take 800 mg by mouth every 8 (eight) hours as needed for moderate pain.  01/10/14  Yes Ena Dawley, MD  Insulin Glargine (TOUJEO SOLOSTAR [place]) Inject 34 Units into the skin every morning.   Yes Historical Provider, MD  metFORMIN (GLUCOPHAGE-XR) 500 MG 24 hr tablet Take 500 mg by mouth 2 (two) times daily.  05/22/14  Yes Historical Provider, MD  permethrin (ELIMITE) 5 % cream  Apply 1 application topically daily.  05/22/14  Yes Historical Provider, MD  promethazine (PHENERGAN) 25 MG tablet Take 25 mg by mouth every 6 (six) hours as needed for nausea or vomiting.  06/14/14  Yes Historical Provider, MD  traMADol (ULTRAM) 50 MG tablet Take 2 tablets (100 mg total) by mouth every 6 (six) hours as needed for severe pain. 01/10/14  Yes Ena Dawley, MD  traZODone (DESYREL) 50 MG tablet Take 50 mg by mouth at bedtime.  06/14/14  Yes Historical Provider, MD  fluticasone (FLONASE) 50 MCG/ACT nasal spray Place 2 sprays into both nostrils daily. 08/15/15   Bonnielee Haff, MD  minocycline (MINOCIN,DYNACIN) 100 MG capsule Take 1 capsule (100 mg total) by mouth 2 (two) times daily. Patient not taking: Reported on 08/26/2014 01/10/14   Ena Dawley, MD  naproxen  (NAPROSYN) 500 MG tablet Take 1 tablet (500 mg total) by mouth 2 (two) times daily with a meal. Patient not taking: Reported on 08/14/2015 08/26/14   Larence Penning, MD  ondansetron (ZOFRAN ODT) 8 MG disintegrating tablet Take 1 tablet (8 mg total) by mouth every 8 (eight) hours as needed for nausea or vomiting. Patient not taking: Reported on 08/26/2014 07/27/13   Linton Flemings, MD  oseltamivir (TAMIFLU) 75 MG capsule Take 1 capsule (75 mg total) by mouth 2 (two) times daily. 08/15/15   Bonnielee Haff, MD  sulfamethoxazole-trimethoprim (BACTRIM DS,SEPTRA DS) 800-160 MG per tablet Take 1 tablet by mouth 2 (two) times daily. Patient not taking: Reported on 08/14/2015 08/26/14   Larence Penning, MD  SUMAtriptan (IMITREX) 100 MG tablet Take 1 tablet (100 mg total) by mouth every 2 (two) hours as needed for migraine. May repeat in 2 hours if headache persists or recurs. Patient not taking: Reported on 08/14/2015 07/30/15   Campbell Riches, MD   BP 131/78 mmHg  Pulse 83  Temp(Src) 97.8 F (36.6 C) (Oral)  Resp 18  Ht 5\' 7"  (1.702 m)  Wt 244 lb 4.8 oz (110.814 kg)  BMI 38.25 kg/m2  SpO2 100% Physical Exam  Constitutional: She is oriented to person, place, and time. She appears well-developed and well-nourished. No distress.  HENT:  Head: Normocephalic and atraumatic.  Eyes: Pupils are equal, round, and reactive to light.  Neck: Normal range of motion.  Cardiovascular: Normal rate and intact distal pulses.   Pulmonary/Chest: No respiratory distress.  Abdominal: Normal appearance. She exhibits no distension. There is no tenderness. There is no rebound.  Musculoskeletal: Normal range of motion.  Neurological: She is alert and oriented to person, place, and time. She has normal strength. No cranial nerve deficit. GCS eye subscore is 4. GCS verbal subscore is 5. GCS motor subscore is 6.  Skin: Skin is warm and dry. No rash noted.  Psychiatric: She has a normal mood and affect. Her behavior is normal.  Nursing note  and vitals reviewed.   ED Course  Procedures (including critical care time) Medications  elvitegravir-cobicistat-emtricitabine-tenofovir (GENVOYA) 150-150-200-10 MG tablet 1 tablet (1 tablet Oral Given 08/15/15 0821)  traZODone (DESYREL) tablet 50 mg (50 mg Oral Given 08/14/15 2209)  traMADol (ULTRAM) tablet 50 mg (50 mg Oral Given 08/15/15 0821)  oseltamivir (TAMIFLU) capsule 75 mg (75 mg Oral Given 08/15/15 0822)  sodium chloride flush (NS) 0.9 % injection 3 mL (3 mLs Intravenous Not Given 08/15/15 1000)  0.9 %  sodium chloride infusion ( Intravenous Rate/Dose Change 08/15/15 1401)  acetaminophen (TYLENOL) tablet 650 mg (650 mg Oral Given 08/14/15 2341)    Or  acetaminophen (TYLENOL) suppository 650 mg ( Rectal See Alternative 08/14/15 2341)  ondansetron (ZOFRAN) tablet 4 mg ( Oral See Alternative 08/15/15 1358)    Or  ondansetron (ZOFRAN) injection 4 mg (4 mg Intravenous Given 08/15/15 1358)  insulin glargine (LANTUS) injection 15 Units (15 Units Subcutaneous Given 08/15/15 0821)  insulin aspart (novoLOG) injection 0-15 Units (5 Units Subcutaneous Given 08/15/15 0838)  insulin aspart (novoLOG) injection 0-5 Units (0 Units Subcutaneous Not Given 08/14/15 2209)  fluticasone (FLONASE) 50 MCG/ACT nasal spray 2 spray (not administered)  sodium chloride 0.9 % bolus 1,000 mL (0 mLs Intravenous Stopped 08/14/15 1732)  ondansetron (ZOFRAN) injection 4 mg (4 mg Intravenous Given 08/14/15 1454)  0.9 %  sodium chloride infusion (0 mL/hr Intravenous Stopped 08/14/15 2039)  sodium chloride 0.9 % bolus 500 mL (500 mLs Intravenous Given 08/15/15 0845)    Labs Review Labs Reviewed  BASIC METABOLIC PANEL - Abnormal; Notable for the following:    Glucose, Bld 149 (*)    Creatinine, Ser 1.21 (*)    Calcium 8.4 (*)    GFR calc non Af Amer 53 (*)    All other components within normal limits  CBC - Abnormal; Notable for the following:    Hemoglobin 7.1 (*)    HCT 24.9 (*)    MCV 59.7 (*)    MCH 17.0 (*)    MCHC 28.5  (*)    RDW 18.0 (*)    All other components within normal limits  HEPATIC FUNCTION PANEL - Abnormal; Notable for the following:    Albumin 3.3 (*)    ALT 13 (*)    Total Bilirubin 0.2 (*)    Bilirubin, Direct <0.1 (*)    All other components within normal limits  COMPREHENSIVE METABOLIC PANEL - Abnormal; Notable for the following:    Glucose, Bld 240 (*)    Creatinine, Ser 1.07 (*)    Calcium 8.1 (*)    Albumin 3.0 (*)    ALT 12 (*)    All other components within normal limits  CBC - Abnormal; Notable for the following:    Hemoglobin 8.4 (*)    HCT 28.2 (*)    MCV 64.8 (*)    MCH 19.3 (*)    MCHC 29.8 (*)    RDW 22.6 (*)    All other components within normal limits  GLUCOSE, CAPILLARY - Abnormal; Notable for the following:    Glucose-Capillary 161 (*)    All other components within normal limits  GLUCOSE, CAPILLARY - Abnormal; Notable for the following:    Glucose-Capillary 209 (*)    All other components within normal limits  GLUCOSE, CAPILLARY - Abnormal; Notable for the following:    Glucose-Capillary 177 (*)    All other components within normal limits  URINALYSIS, ROUTINE W REFLEX MICROSCOPIC (NOT AT [facility])  LIPASE, BLOOD  TROPONIN I  I-STAT CG4 LACTIC ACID, ED  I-STAT BETA HCG BLOOD, ED ([facility], [facility], [facility] ONLY)  I-STAT TROPOININ, ED  TYPE AND SCREEN  PREPARE RBC (CROSSMATCH)  ABO/RH    Imaging Review Dg Chest 2 View  08/15/2015  CLINICAL DATA:  Cough, flu-like symptoms for several days; 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 EXAM: CHEST  2 VIEW COMPARISON:  PA and lateral chest x-rays of August 13, 2008 and 27 July 2013 FINDINGS: The lungs are adequately inflated. There is no focal infiltrate. The interstitial markings are more conspicuous bilaterally today. There is no pleural effusion. The heart and pulmonary vascularity are normal. The mediastinum is normal  in width. The bony thorax is unremarkable. IMPRESSION: Increased interstitial markings suggestive of acute bronchitic change.  There is no alveolar pneumonia nor CHF. Electronically Signed   By: David  Martinique M.D.   On: 08/15/2015 08:02   Dg Chest 2 View  08/14/2015  CLINICAL DATA:  New Syncope x 1 day, pt diagnosed with flu 2 days ago EXAM: CHEST  2 VIEW COMPARISON:  07/27/2013 FINDINGS: Mild enlargement of the cardiopericardial silhouette. Low lung volumes are present, causing crowding of the pulmonary vasculature. No definite edema. Lateral projection blurred by motion artifact, causing reduced sensitivity and specificity. No definite pleural effusion. IMPRESSION: 1. Mild enlargement of the cardiopericardial silhouette. 2. Low lung volumes are present, causing crowding of the pulmonary vasculature. Electronically Signed   By: Van Clines M.D.   On: 08/14/2015 15:24   Ct Head Wo Contrast  08/14/2015  CLINICAL DATA:  PT PASSED OUT TODAY APPARENTLY DUE TO LOW HEMAGLOBIN. SHE IS NOW RECEIVING BLOOD. DOES NOT THINK SHE HIT HER HEAD BUT DOES NOT REMEMBER ANYTHING. EXAM: CT HEAD WITHOUT CONTRAST TECHNIQUE: Contiguous axial images were obtained from the base of the skull through the vertex without intravenous contrast. COMPARISON:  02/22/2010 FINDINGS: The brainstem, cerebellum, cerebral peduncles, thalami, basal ganglia, basilar cisterns, and ventricular system appear within normal limits. No intracranial hemorrhage, mass lesion, or acute CVA. Mucosal thickening in the nasal cavity. Chronic ethmoid, sphenoid, and maxillary sinusitis. No middle ear fluid; mastoid air cells appear clear. IMPRESSION: 1. No acute intracranial findings. 2. Chronic ethmoid, sphenoid, and maxillary sinusitis. Electronically Signed   By: Van Clines M.D.   On: 08/14/2015 18:14   Mr Brain Wo Contrast  08/15/2015  CLINICAL DATA:  HIV positive patient with history of hypertension. Worsening of dizziness. Blackout spell, found on the floor. EXAM: MRI HEAD WITHOUT CONTRAST TECHNIQUE: Multiplanar, multiecho pulse sequences of the brain and surrounding  structures were obtained without intravenous contrast. COMPARISON:  Head CT 08/14/2015 FINDINGS: The brain does not show accelerated atrophy. Diffusion imaging does not show any acute or subacute infarction. The brainstem and cerebellum are normal. Cerebral hemispheres are normal except for low level T2 signal in the white matter that can be seen in HIV infection. No evidence of mass lesion, hemorrhage, hydrocephalus or extra-axial collection. No pituitary mass. There are inflammatory changes throughout the paranasal sinuses. No fluid in the middle ears or mastoids. Major vessels at the base the brain show flow. IMPRESSION: No acute intracranial finding. Low level T2 signal of the white matter which can be seen in HIV infection. Mucosal inflammatory changes of the paranasal sinuses. Electronically Signed   By: Nelson Chimes M.D.   On: 08/15/2015 15:17   I have personally reviewed and evaluated these images and lab results as part of my medical decision-making.   EKG Interpretation   Date/Time:  Thursday August 14 2015 14:19:14 EST Ventricular Rate:  86 PR Interval:  179 QRS Duration: 98 QT Interval:  402 QTC Calculation: 481 R Axis:   89 Text Interpretation:  Sinus rhythm Confirmed by Othon Guardia  MD, Sonny Anthes ([id number])  on 08/14/2015 2:46:08 PM      MDM   Final diagnoses:  Anemia, unspecified anemia type  Syncope, unspecified syncope type        Leonard Schwartz, MD 08/15/15 1545

## 2015-08-14 NOTE — H&P (Signed)
[facility] History and Physical  Brenda [name] MPN:[id number] DOB: 01-20-1971 DOA: 08/14/2015   PCP: Harvie Junior, MD  Specialists: Dr. Johnnye Sima is her HIV provider  Chief Complaint: Passed out  HPI: Brenda [name] is a 45 y.o. female with a past medical history of HIV (last CD4 count was 59 in February of this year), history of diabetes on insulin, history of migraine headaches, history of hypertension who was in her usual state of health till this past Tuesday when she started developing cough with greenish expectoration. She had fever up to 99.17F along with chills. Patient went to her primary care physician yesterday. She was diagnosed as having flu and was started on Tamiflu. Over these past few days patient has had nausea but no vomiting. She's had multiple episodes of diarrhea. She's had poor oral intake. She denies being on any antibiotics recently. She denies any abdominal pain. She does have chronic back pain. The last few days she has had episodes of dizziness. And then this morning she woke up, went to the kitchen and made herself a hamburger. While she was eating, she started feeling dizzy and then found herself on the floor. She had blacked out. She called her mother and then call 911 as the weakness persisted. She currently is complaining of a left-sided headache. 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 She does have a history of migraine headaches. She denies any chest pain, shortness of breath, palpitations. No history of seizures.  Apparently when EMS arrived at her house, her systolic blood pressure was in the 70s. She was started on fluids. She was brought into the emergency department. Blood pressures were soft here as well. And improved after IV fluids.  Home Medications: Prior to Admission medications   Medication Sig Start Date End Date Taking? Authorizing Provider  elvitegravir-cobicistat-emtricitabine-tenofovir (GENVOYA) 150-150-200-10 MG TABS tablet Take 1  tablet by mouth daily with breakfast. 07/30/15  Yes Campbell Riches, MD  ibuprofen (ADVIL,MOTRIN) 800 MG tablet Take 1 tablet (800 mg total) by mouth every 8 (eight) hours as needed. Patient taking differently: Take 800 mg by mouth every 8 (eight) hours as needed for moderate pain.  01/10/14  Yes Ena Dawley, MD  Insulin Glargine (TOUJEO SOLOSTAR [place]) Inject 34 Units into the skin every morning.   Yes Historical Provider, MD  metFORMIN (GLUCOPHAGE-XR) 500 MG 24 hr tablet Take 500 mg by mouth 2 (two) times daily.  05/22/14  Yes Historical Provider, MD  permethrin (ELIMITE) 5 % cream Apply 1 application topically daily.  05/22/14  Yes Historical Provider, MD  promethazine (PHENERGAN) 25 MG tablet Take 25 mg by mouth every 6 (six) hours as needed for nausea or vomiting.  06/14/14  Yes Historical Provider, MD  traMADol (ULTRAM) 50 MG tablet Take 2 tablets (100 mg total) by mouth every 6 (six) hours as needed for severe pain. 01/10/14  Yes Ena Dawley, MD  traZODone (DESYREL) 50 MG tablet Take 50 mg by mouth at bedtime.  06/14/14  Yes Historical Provider, MD  minocycline (MINOCIN,DYNACIN) 100 MG capsule Take 1 capsule (100 mg total) by mouth 2 (two) times daily. Patient not taking: Reported on 08/26/2014 01/10/14   Ena Dawley, MD  naproxen (NAPROSYN) 500 MG tablet Take 1 tablet (500 mg total) by mouth 2 (two) times daily with a meal. Patient not taking: Reported on 08/14/2015 08/26/14   Larence Penning, MD  ondansetron (ZOFRAN ODT) 8 MG disintegrating tablet Take 1 tablet (8 mg total) by mouth  every 8 (eight) hours as needed for nausea or vomiting. Patient not taking: Reported on 08/26/2014 07/27/13   Linton Flemings, MD  sulfamethoxazole-trimethoprim (BACTRIM DS,SEPTRA DS) 800-160 MG per tablet Take 1 tablet by mouth 2 (two) times daily. Patient not taking: Reported on 08/14/2015 08/26/14   Larence Penning, MD  SUMAtriptan (IMITREX) 100 MG tablet Take 1 tablet (100 mg total) by mouth every 2 (two) hours as needed for  migraine. May repeat in 2 hours if headache persists or recurs. Patient not taking: Reported on 08/14/2015 07/30/15   Campbell Riches, MD    Allergies:  Allergies  Allergen Reactions  . Cortisone Itching    REACTION: "nerves"  . Hydrocodone-Acetaminophen Itching    REACTION: "nerves"  . Morphine Itching    REACTION: "nerves"    Past Medical History: Past Medical History  Diagnosis Date  . HIV disease ([place])   . Diabetes mellitus   . Hemorrhoids   . 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 Chronic folliculitis   . Iron deficiency anemia due to chronic blood loss   . Obesity   . Pneumothorax, traumatic   . Arthritis     knees  . Irregular menstrual cycle   . Migraines   . Hypertension     Past Surgical History  Procedure Laterality Date  . Hysteroscopy w/d&c  Removal of Endometrial Polyp    Dr. Raphael Gibney 2011  . Chest tube insertion    . Tubal ligation    . Laparoscopy N/A 01/10/2014    Procedure: LAPAROSCOPY OPERATIVE;  Surgeon: Ena Dawley, MD;  Location: [facility];  Service: Gynecology;  Laterality: N/A;  . Umbilical hernia repair  01/10/2014    Procedure: HERNIA REPAIR UMBILICAL ADULT;  Surgeon: Ena Dawley, MD;  Location: [facility];  Service: Gynecology;;  . Laparoscopic lysis of adhesions  01/10/2014    Procedure: LAPAROSCOPIC LYSIS OF ADHESIONS;  Surgeon: Ena Dawley, MD;  Location: [facility];  Service: Gynecology;;    Social History: She lives in [place] with her children. Unemployed. Does not smoke. Alcohol is social. Usually independent with daily activities  Family History:  Family History  Problem Relation Age of Onset  . Diabetes Mother   . Heart failure Father   . Diabetes Father   . Heart failure Brother   . Colon cancer      neg hx.     Review of Systems - History obtained from the patient General ROS: positive for  - fatigue Psychological ROS: negative Ophthalmic ROS: negative ENT ROS: negative Allergy and Immunology ROS:  negative Hematological and Lymphatic ROS: negative Endocrine ROS: negative Respiratory ROS: as in hpi Cardiovascular ROS: as in hpi Gastrointestinal ROS: as in hpi Genito-Urinary ROS: no dysuria, trouble voiding, or hematuria Musculoskeletal ROS: chronic back pain Neurological ROS: no TIA or stroke symptoms Dermatological ROS: negative  Physical Examination  Filed Vitals:   08/14/15 1500 08/14/15 1530 08/14/15 1600 08/14/15 1732  BP: 116/76 120/80 122/70   Pulse: 82 85 84   Temp:    97.9 F (36.6 C)  TempSrc:    Oral  Resp: _0 Height:      Weight:      SpO2: 100% 100% 100%     BP 122/70 mmHg  Pulse 84  Temp(Src) 97.9 F (36.6 C) (Oral)  Resp 15  Ht 5' 9" (1.753 m)  Wt 105.235 kg (232 lb)  BMI 34.24 kg/m2  SpO2 100%  General appearance: alert, cooperative, appears stated age, no distress  and moderately obese Head: Normocephalic, without obvious abnormality, atraumatic Eyes: conjunctivae/corneas clear. PERRL, EOM's intact.  Throat: Dry mucous membranes Neck: no adenopathy, no carotid bruit, no JVD, supple, symmetrical, trachea midline and thyroid not enlarged, symmetric, no tenderness/mass/nodules Back: symmetric, no curvature. ROM normal. No CVA tenderness. Resp: Diminished air entry, especially at the bases. Few crackles noted, which diminished with deep breathing. Cardio: regular rate and rhythm, S1, S2 normal, no murmur, click, rub or gallop GI: Abdomen is soft. Slightly tender in the right upper quadrant without any rebound, rigidity or guarding. Murphy sign is equivocal. Extremities: extremities normal, atraumatic, no cyanosis or edema Pulses: 2+ and symmetric Skin: Skin color, texture, turgor normal. No rashes or lesions Lymph nodes: Cervical, supraclavicular, and axillary nodes normal. Neurologic: Awake and alert. Oriented 3. No facial asymmetry. Cranial nerves II-12 intact. Motor strength equal bilateral upper and lower extremities.  Laboratory  Data: Results for orders placed or performed during the hospital encounter of 08/14/15 (from the past 48 hour(s))  Basic metabolic panel     Status: Abnormal   Collection Time: 08/14/15  4:02 PM  Result Value Ref Range   Sodium 136 135 - 145 mmol/L   Potassium 3.7 3.5 - 5.1 mmol/L   Chloride 104 101 - 111 mmol/L   CO2 24 22 - 32 mmol/L   Glucose, Bld 149 (H) 65 - 99 mg/dL   BUN 13 6 - 20 mg/dL   Creatinine, Ser 1.21 (H) 0.44 - 1.00 mg/dL   Calcium 8.4 (L) 8.9 - 10.3 mg/dL   GFR calc non Af Amer 53 (L) >60 mL/min   GFR calc Af Amer >60 >60 mL/min    Comment: (NOTE) The eGFR has been calculated using the CKD EPI equation. This calculation has not been validated in all clinical situations. eGFR's persistently <60 mL/min signify possible Chronic Kidney Disease.    Anion gap 8 5 - 15  CBC     Status: Abnormal   Collection Time: 08/14/15  4:02 PM  Result Value Ref Range   WBC 5.6 4.0 - 10.5 K/uL   RBC 4.17 3.87 - 5.11 MIL/uL   Hemoglobin 7.1 (L) 12.0 - 15.0 g/dL   HCT 24.9 (L) 36.0 - 46.0 %   MCV 59.7 (L) 78.0 - 100.0 fL   MCH 17.0 (L) 26.0 - 34.0 pg   MCHC 28.5 (L) 30.0 - 36.0 g/dL   RDW 18.0 (H) 11.5 - 15.5 %   Platelets 392 150 - 400 K/uL  I-Stat Beta hCG blood, ED ([facility], [facility], [facility] only)     Status: None   Collection Time: 08/14/15  4:12 PM  Result Value Ref Range   I-stat hCG, quantitative <5.0 <5 mIU/mL   Comment 3            Comment:   GEST. AGE      CONC.  (mIU/mL)   <=1 WEEK        5 - 50     2 WEEKS       50 - 500     3 WEEKS       100 - 10,000     4 WEEKS     1,000 - 30,000        FEMALE AND NON-PREGNANT FEMALE:     LESS THAN 5 mIU/mL   I-stat troponin, ED     Status: None   Collection Time: 08/14/15  4:12 PM  Result Value Ref Range   Troponin i, poc 0.00 0.00 - 0.08 ng/mL  Comment 3            Comment: Due to the release kinetics of cTnI, a negative result within the first hours of the onset of symptoms does not rule out myocardial infarction with certainty. If  myocardial infarction is still suspected, repeat the test at appropriate intervals.   I-Stat CG4 Lactic Acid, ED     Status: None   Collection Time: 08/14/15  4:14 PM  Result Value Ref Range   Lactic Acid, Venous 1.10 0.5 - 2.0 mmol/L  Urinalysis, Routine w reflex microscopic (not at [facility])     Status: None   Collection Time: 08/14/15  4:21 PM  Result Value Ref Range   Color, Urine YELLOW YELLOW   APPearance CLEAR CLEAR   Specific Gravity, Urine 1.009 1.005 - 1.030   pH 5.0 5.0 - 8.0   Glucose, UA NEGATIVE NEGATIVE mg/dL   Hgb urine dipstick NEGATIVE NEGATIVE   Bilirubin Urine NEGATIVE NEGATIVE   Ketones, ur NEGATIVE NEGATIVE mg/dL   Protein, ur NEGATIVE NEGATIVE mg/dL   Nitrite NEGATIVE NEGATIVE   Leukocytes, UA NEGATIVE NEGATIVE    Comment: MICROSCOPIC NOT DONE ON URINES WITH NEGATIVE PROTEIN, BLOOD, LEUKOCYTES, NITRITE, OR GLUCOSE <1000 mg/dL.  Type and screen [facility]     Status: None (Preliminary result)   Collection Time: 08/14/15  4:56 PM  Result Value Ref Range   ABO/RH(D) O POS    Antibody Screen PENDING    Sample Expiration 08/17/2015     Radiology Reports: Dg Chest 2 View  08/14/2015  CLINICAL DATA:  New Syncope x 1 day, pt diagnosed with flu 2 days ago EXAM: CHEST  2 VIEW COMPARISON:  07/27/2013 FINDINGS: Mild enlargement of the cardiopericardial silhouette. Low lung volumes are present, causing crowding of the pulmonary vasculature. No definite edema. Lateral projection blurred by motion artifact, causing reduced sensitivity and specificity. No definite pleural effusion. IMPRESSION: 1. Mild enlargement of the cardiopericardial silhouette. 2. Low lung volumes are present, causing crowding of the pulmonary vasculature. Electronically Signed   By: Van Clines M.D.   On: 08/14/2015 15:24    My interpretation of Electrocardiogram: Sinus rhythm at 86 bpm. Normal axis. Intervals are normal. No Q waves. No concerning ST or T-wave changes.  Problem  List  Principal Problem:   Syncope Active Problems:   Human immunodeficiency virus (HIV) disease (HCC)   Insulin dependent type 2 diabetes mellitus, uncontrolled (HCC)   ANEMIA, IRON DEFICIENCY   Migraine headache   Flu-like symptoms   Acute diarrhea   RUQ abdominal pain   Assessment: This is a 45 year old African-American female with past medical history as stated earlier, who comes in after a syncopal episode. Patient was diagnosed with a flulike illness yesterday and was started on Tamiflu. She's had a few episodes of diarrhea. Her syncope is most likely due to hypovolemia. Vasovagal is another possibility. EKG does not show any concerning changes. Headache is probably her migraine headache, but due to her fall and syncope, will need to do imaging study.  Plan: #1 syncope: Most likely secondary to hypovolemia from recent illness and diarrhea. Proceed with CT head as the patient does complain of a headache. EKG does not show any concerning changes. She'll be given IV fluids. Orthostatics will be checked. At this time there is no clear indication to obtaining an echocardiogram.  #2 flulike illness: She was empirically started on Tamiflu by her primary care provider yesterday. This will be continued. She is noted to be afebrile  here in the hospital. Chest x-ray does not show any infiltrates. WBC is normal. Hold off on antibacterial treatment for now. Consider repeating chest x-ray tomorrow morning after she has been adequately hydrated. Lactic acid is normal.  #3 acute diarrhea: Probably related to the above. Monitor closely. If persist, check for C. difficile.  #4 Mildly elevated creatinine: This is likely secondary to dehydration from recent diarrhea and flulike illness. She'll be hydrated. Repeat labs tomorrow morning.  #5 Microcytic anemia: Patient has a known history of iron deficiency. Hemoglobin has been trending down over the past many months. No overt bleeding noted. Could've  contributed to her syncope. ED physician has ordered blood transfusion.  #4 HIV: She is followed closely by the ID clinic. Continue her antiretroviral treatment. Recent CD4 count was greater than 500.  #5 right upper quadrant abdominal pain: This was detected mainly on examination. Check LFTs and lipase. If LFTs are abnormal, she will need further evaluation.  #6 Insulin-dependent diabetes mellitus type 2: Blood glucose level is very well controlled. Continue with long-acting insulin along with sliding scale coverage. No recent HbA1c, available in our system. This can be pursued as an outpatient.   DVT Prophylaxis: SCDs Code Status: Full code Family Communication: Discussed with the patient  Disposition Plan: Observe to telemetry   Further management decisions will depend on results of further testing and patient's response to treatment.   [facility]  [facility] Pager [phone]  If 7PM-7AM, please contact night-coverage [email or web address] Password [email or web address]  08/14/2015, 5:38 PM

## 2015-08-14 NOTE — ED Notes (Signed)
Pt presents via [facility] from home after a syncopal episode.  Pt was in kitchen and states the next thing she was in the floor, crawled to the bedroom and layed down, mother called EMS.  Pt dx with flu x 1 day ago, took 1 day of Tamiflu, hx: HIV.  BP intially 76 palp, alert throughout.  Denies injury from fall, no neck or back pain.  Pt reports generalized weakness and fatigue.  Pt received 200cc NS en route, BP 118/63 on arrival.  P-90 O2-99% RA, R-20.  Alert x 4 , NAD.

## 2015-08-15 ENCOUNTER — Observation Stay (HOSPITAL_COMMUNITY): Payer: Medicare Other

## 2015-08-15 DIAGNOSIS — R05 Cough: Secondary | ICD-10-CM | POA: Diagnosis not present

## 2015-08-15 DIAGNOSIS — R6889 Other general symptoms and signs: Secondary | ICD-10-CM | POA: Diagnosis not present

## 2015-08-15 DIAGNOSIS — R42 Dizziness and giddiness: Secondary | ICD-10-CM | POA: Diagnosis not present

## 2015-08-15 DIAGNOSIS — M4802 Spinal stenosis, cervical region: Secondary | ICD-10-CM | POA: Diagnosis not present

## 2015-08-15 DIAGNOSIS — R55 Syncope and collapse: Secondary | ICD-10-CM | POA: Diagnosis not present

## 2015-08-15 DIAGNOSIS — I951 Orthostatic hypotension: Secondary | ICD-10-CM | POA: Diagnosis not present

## 2015-08-15 DIAGNOSIS — B20 Human immunodeficiency virus [HIV] disease: Secondary | ICD-10-CM | POA: Diagnosis not present

## 2015-08-15 DIAGNOSIS — R202 Paresthesia of skin: Secondary | ICD-10-CM | POA: Diagnosis not present

## 2015-08-15 LAB — TROPONIN I

## 2015-08-15 LAB — COMPREHENSIVE METABOLIC PANEL
ALBUMIN: 3 g/dL — AB (ref 3.5–5.0)
ALK PHOS: 82 U/L (ref 38–126)
ALT: 12 U/L — ABNORMAL LOW (ref 14–54)
ANION GAP: 5 (ref 5–15)
AST: 17 U/L (ref 15–41)
BILIRUBIN TOTAL: 0.4 mg/dL (ref 0.3–1.2)
BUN: 10 mg/dL (ref 6–20)
CALCIUM: 8.1 mg/dL — AB (ref 8.9–10.3)
CO2: 25 mmol/L (ref 22–32)
Chloride: 107 mmol/L (ref 101–111)
Creatinine, Ser: 1.07 mg/dL — ABNORMAL HIGH (ref 0.44–1.00)
GFR calc non Af Amer: >60 mL/min (ref >60)
GLUCOSE: 240 mg/dL — AB (ref 65–99)
POTASSIUM: 4 mmol/L (ref 3.5–5.1)
SODIUM: 137 mmol/L (ref 135–145)
TOTAL PROTEIN: 6.6 g/dL (ref 6.5–8.1)

## 2015-08-15 LAB — TYPE AND SCREEN
ABO/RH(D): O POS
ANTIBODY SCREEN: NEGATIVE
UNIT DIVISION: 0
UNIT DIVISION: 0

## 2015-08-15 LAB — GLUCOSE, CAPILLARY
GLUCOSE-CAPILLARY: 209 mg/dL — AB (ref 65–99)
GLUCOSE-CAPILLARY: 177 mg/dL — AB (ref 65–99)
GLUCOSE-CAPILLARY: 146 mg/dL — AB (ref 65–99)
Glucose-Capillary: 202 mg/dL — ABNORMAL HIGH (ref 65–99)

## 2015-08-15 LAB — CBC
HEMATOCRIT: 28.2 % — AB (ref 36.0–46.0)
HEMOGLOBIN: 8.4 g/dL — AB (ref 12.0–15.0)
MCH: 19.3 pg — ABNORMAL LOW (ref 26.0–34.0)
MCHC: 29.8 g/dL — ABNORMAL LOW (ref 30.0–36.0)
MCV: 64.8 fL — ABNORMAL LOW (ref 78.0–100.0)
Platelets: 378 10*3/uL (ref 150–400)
RBC: 4.35 MIL/uL (ref 3.87–5.11)
RDW: 22.6 % — ABNORMAL HIGH (ref 11.5–15.5)
WBC: 5.9 10*3/uL (ref 4.0–10.5)

## 2015-08-15 MED ORDER — FLUTICASONE PROPIONATE 50 MCG/ACT NA SUSP: 2.0000 | Freq: Every day | NASAL | Status: DC

## 2015-08-15 MED ORDER — FLUTICASONE PROPIONATE 50 MCG/ACT NA SUSP
2.0000 | Freq: Every day | NASAL | Status: DC
  Administered 2015-08-15 – 2015-08-16 (×2): 2 via NASAL
  Filled 2015-08-15: qty 16

## 2015-08-15 MED ORDER — SODIUM CHLORIDE 0.9 % IV BOLUS (SEPSIS)
500.0000 mL | Freq: Once | INTRAVENOUS | Status: AC
  Administered 2015-08-15: 500 mL via INTRAVENOUS

## 2015-08-15 MED ORDER — OSELTAMIVIR PHOSPHATE 75 MG PO CAPS: 75.0000 mg | ORAL_CAPSULE | Freq: Two times a day (BID) | ORAL | Status: DC

## 2015-08-15 MED ORDER — ZOLPIDEM TARTRATE 5 MG PO TABS
5.0000 mg | ORAL_TABLET | Freq: Once | ORAL | Status: AC
  Administered 2015-08-16: 5 mg via ORAL
  Filled 2015-08-15: qty 1

## 2015-08-15 NOTE — Care Management Obs Status (Signed)
[facility] NOTIFICATION   Patient Details  Name: BRESHAWNA BACIK MRN: [id number] Date of Birth: 09-27-1970   Medicare Observation Status Notification Given:  Yes    Carles Collet, RN 08/15/2015, 8:11 AM

## 2015-08-15 NOTE — Progress Notes (Signed)
[facility] PROGRESS NOTE  Brenda [name] [id number] DOB: 1971-05-01 DOA: 08/14/2015  PCP: Harvie Junior, MD  Brief HPI: 45 year old African-American female with a past medical history of HIV, diabetes on insulin, migraine headaches, presented after a syncopal episode at home. For few days prior to this admission she had been having cough with greenish expectoration along with fever. She was diagnosed with influenza by her primary care provider and was started on Tamiflu. Syncope was thought to be secondary to acute illness and hypovolemia, as patient was also complaining of diarrhea. She was hospitalized for further management.  Past medical history:  Past Medical History  Diagnosis Date  . HIV disease ([place])   . Diabetes mellitus   . Hemorrhoids   . 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 Chronic folliculitis   . Iron deficiency anemia due to chronic blood loss   . Obesity   . Pneumothorax, traumatic   . Arthritis     knees  . Irregular menstrual cycle   . Migraines   . Hypertension     Consultants: None  Procedures: None  Antibiotics: Tamiflu  Subjective: Patient mentioned that when she woke up this morning, she experienced tingling and numbness in her left arm. Denies any such symptoms in the left leg. She continues to have the left-sided headache that she was having yesterday as well. Also complains of pain in the neck area. Chest area and Abdomen. No further diarrhea since yesterday.  Objective: Vital Signs  Filed Vitals:   08/15/15 0133 08/15/15 0217 08/15/15 0512 08/15/15 0959  BP: 120/64 118/64 108/58 131/78  Pulse: 86 85 86 83  Temp: 98.6 F (37 C) 98.7 F (37.1 C) 97.8 F (36.6 C)   TempSrc: Oral Oral Oral   Resp: 18 20 18    Height:      Weight:   110.814 kg (244 lb 4.8 oz)   SpO2: 92% 98% 100%     Intake/Output Summary (Last 24 hours) at 08/15/15 1352 Last data filed at 08/15/15 0159  Gross per 24 hour  Intake    627 ml  Output       0 ml  Net    627 ml   Filed Weights   08/14/15 1418 08/14/15 2115 08/15/15 0512  Weight: 105.235 kg (232 lb) 106.958 kg (235 lb 12.8 oz) 110.814 kg (244 lb 4.8 oz)    General appearance: alert, cooperative, appears stated age and no distress Resp: Coarse breath sounds bilaterally. No obvious wheezing or rhonchi. No crackles. Cardio: regular rate and rhythm, S1, S2 normal, no murmur, click, rub or gallop GI: soft, non-tender; bowel sounds normal; no masses,  no organomegaly Extremities: extremities normal, atraumatic, no cyanosis or edema Neurologic: Alert and oriented 3. No facial asymmetry. Cranial nerves II-12 intact. Motor strength equal bilateral upper and lower extremity. No pronator drift. Somewhat restricted range of motion of her neck.  Lab Results:  Basic Metabolic Panel:  Recent Labs Lab 08/14/15 1602 08/15/15 0529  NA 136 137  K 3.7 4.0  CL 104 107  CO2 24 25  GLUCOSE 149* 240*  BUN 13 10  CREATININE 1.21* 1.07*  CALCIUM 8.4* 8.1*   Liver Function Tests:  Recent Labs Lab 08/14/15 1918 08/15/15 0529  AST 22 17  ALT 13* 12*  ALKPHOS 71 82  BILITOT 0.2* 0.4  PROT 6.8 6.6  ALBUMIN 3.3* 3.0*    Recent Labs Lab 08/14/15 1918  LIPASE 44   CBC:  Recent Labs Lab 08/14/15  1602 08/15/15 0529  WBC 5.6 5.9  HGB 7.1* 8.4*  HCT 24.9* 28.2*  MCV 59.7* 64.8*  PLT 392 378   Cardiac Enzymes:  Recent Labs Lab 08/15/15 0854  TROPONINI <0.03   CBG:  Recent Labs Lab 08/14/15 2133 08/15/15 0817 08/15/15 1155  GLUCAP 161* 209* 177*    No results found for this or any previous visit (from the past 240 hour(s)).    Studies/Results: Dg Chest 2 View  08/15/2015  CLINICAL DATA:  Cough, flu-like symptoms for several days; 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 EXAM: CHEST  2 VIEW COMPARISON:  PA and lateral chest x-rays of August 13, 2008 and 27 July 2013 FINDINGS: The lungs are adequately inflated. There is no focal infiltrate. The interstitial markings are more  conspicuous bilaterally today. There is no pleural effusion. The heart and pulmonary vascularity are normal. The mediastinum is normal in width. The bony thorax is unremarkable. IMPRESSION: Increased interstitial markings suggestive of acute bronchitic change. There is no alveolar pneumonia nor CHF. Electronically Signed   By: David  Martinique M.D.   On: 08/15/2015 08:02   Dg Chest 2 View  08/14/2015  CLINICAL DATA:  New Syncope x 1 day, pt diagnosed with flu 2 days ago EXAM: CHEST  2 VIEW COMPARISON:  07/27/2013 FINDINGS: Mild enlargement of the cardiopericardial silhouette. Low lung volumes are present, causing crowding of the pulmonary vasculature. No definite edema. Lateral projection blurred by motion artifact, causing reduced sensitivity and specificity. No definite pleural effusion. IMPRESSION: 1. Mild enlargement of the cardiopericardial silhouette. 2. Low lung volumes are present, causing crowding of the pulmonary vasculature. Electronically Signed   By: Van Clines M.D.   On: 08/14/2015 15:24   Ct Head Wo Contrast  08/14/2015  CLINICAL DATA:  PT PASSED OUT TODAY APPARENTLY DUE TO LOW HEMAGLOBIN. SHE IS NOW RECEIVING BLOOD. DOES NOT THINK SHE HIT HER HEAD BUT DOES NOT REMEMBER ANYTHING. EXAM: CT HEAD WITHOUT CONTRAST TECHNIQUE: Contiguous axial images were obtained from the base of the skull through the vertex without intravenous contrast. COMPARISON:  02/22/2010 FINDINGS: The brainstem, cerebellum, cerebral peduncles, thalami, basal ganglia, basilar cisterns, and ventricular system appear within normal limits. No intracranial hemorrhage, mass lesion, or acute CVA. Mucosal thickening in the nasal cavity. Chronic ethmoid, sphenoid, and maxillary sinusitis. No middle ear fluid; mastoid air cells appear clear. IMPRESSION: 1. No acute intracranial findings. 2. Chronic ethmoid, sphenoid, and maxillary sinusitis. Electronically Signed   By: Van Clines M.D.   On: 08/14/2015 18:14    Medications:   Scheduled: . elvitegravir-cobicistat-emtricitabine-tenofovir  1 tablet Oral Q breakfast  . fluticasone  2 spray Each Nare Daily  . insulin aspart  0-15 Units Subcutaneous TID WC  . insulin aspart  0-5 Units Subcutaneous QHS  . insulin glargine  15 Units Subcutaneous Daily  . oseltamivir  75 mg Oral BID  . sodium chloride flush  3 mL Intravenous Q12H  . traZODone  50 mg Oral QHS   Continuous: . sodium chloride 125 mL/hr at 08/14/15 2210   [id number] **OR** acetaminophen, ondansetron **OR** ondansetron (ZOFRAN) IV, traMADol  Assessment/Plan:  Principal Problem:   Syncope Active Problems:   Human immunodeficiency virus (HIV) disease ([facility])   Insulin dependent type 2 diabetes mellitus, uncontrolled ([facility])   ANEMIA, IRON DEFICIENCY   Migraine headache   Flu-like symptoms   Acute diarrhea   RUQ abdominal pain    Left arm paresthesia Patient woke up with the symptoms. Neurological exam does not suggest acute CVA, but needs  to be ruled out. In any case, she woke up with these symptoms. This could be related to cervical spine issue. CT head done last night did not show any acute process. Proceed with MRI brain and cervical spine.  Syncope/orthostatic hypotension Most likely secondary to hypovolemia from recent illness and diarrhea. Patient does feel slightly better this morning. Still complains of generalized weakness and fatigue. Orthostatics were checked this morning and blood pressure did drop from sitting to standing position. Continue IV fluids, although lower the rate. At this time there is no clear indication to obtaining an echocardiogram.  Flu-like illness She was empirically started on Tamiflu by her primary care provider on 3/8. Continue for now. She remains afebrile. Chest x-ray does not show any infiltrates. WBC is normal. Hold off on antibacterial treatment for now. Chest x-ray repeated this morning also does not show any infiltrates. Lactic acid was normal.     Acute diarrhea Appears to have solved. Okay to discontinue enteric precautions. Probably related to the above.   Mildly elevated creatinine This is likely secondary to dehydration from recent diarrhea and flulike illness. Creatinine has improved with hydration.   Microcytic anemia Patient has a known history of iron deficiency. Hemoglobin has been trending down over the past many months. No overt bleeding noted. Could've contributed to her syncope. She was transfused 2 units of blood. Hemoglobin has improved. Some degree of hemodilution is also present due to hydration.  HIV She is followed closely by the ID clinic. Continue her antiretroviral treatment. Recent CD4 count was greater than 500.  Right upper quadrant abdominal pain Not tender this morning. LFTs are normal. Lipase is normal. Continue to monitor for now.  Insulin-dependent diabetes mellitus type 2 Continue with long-acting insulin along with sliding scale coverage. No recent HbA1c available in our system. This can be pursued as an outpatient.  DVT Prophylaxis: SCDs    Code Status: Full code  Family Communication: Discussed with the patient  Disposition Plan: Await MRI of brain and cervical spine. Anticipate discharge later today or tomorrow.      [facility]  [facility] Pager [phone] 08/15/2015, 1:52 PM  If 7PM-7AM, please contact night-coverage at [email or web address], password [facility]

## 2015-08-16 DIAGNOSIS — I951 Orthostatic hypotension: Secondary | ICD-10-CM | POA: Diagnosis not present

## 2015-08-16 DIAGNOSIS — R55 Syncope and collapse: Secondary | ICD-10-CM | POA: Diagnosis not present

## 2015-08-16 LAB — BASIC METABOLIC PANEL
ANION GAP: 7 (ref 5–15)
BUN: 8 mg/dL (ref 6–20)
CALCIUM: 8.3 mg/dL — AB (ref 8.9–10.3)
CO2: 26 mmol/L (ref 22–32)
Chloride: 106 mmol/L (ref 101–111)
Creatinine, Ser: 0.79 mg/dL (ref 0.44–1.00)
GFR calc Af Amer: >60 mL/min (ref >60)
GFR calc non Af Amer: >60 mL/min (ref >60)
GLUCOSE: 160 mg/dL — AB (ref 65–99)
Potassium: 4.1 mmol/L (ref 3.5–5.1)
Sodium: 139 mmol/L (ref 135–145)

## 2015-08-16 LAB — CBC
HEMATOCRIT: 28.6 % — AB (ref 36.0–46.0)
Hemoglobin: 8.1 g/dL — ABNORMAL LOW (ref 12.0–15.0)
MCH: 18.2 pg — ABNORMAL LOW (ref 26.0–34.0)
MCHC: 28.3 g/dL — ABNORMAL LOW (ref 30.0–36.0)
MCV: 64.3 fL — AB (ref 78.0–100.0)
Platelets: 383 10*3/uL (ref 150–400)
RBC: 4.45 MIL/uL (ref 3.87–5.11)
RDW: 22.5 % — AB (ref 11.5–15.5)
WBC: 5.6 10*3/uL (ref 4.0–10.5)

## 2015-08-16 LAB — GLUCOSE, CAPILLARY
Glucose-Capillary: 146 mg/dL — ABNORMAL HIGH (ref 65–99)
Glucose-Capillary: 157 mg/dL — ABNORMAL HIGH (ref 65–99)

## 2015-08-16 NOTE — Discharge Summary (Signed)
[facility]  Physician Discharge Summary   Patient ID: Brenda [name] MRN: [id number] DOB/AGE: 02/14/1971 45 y.o.  Admit date: 08/14/2015 Discharge date: 08/16/2015  PCP: Harvie Junior, MD  DISCHARGE DIAGNOSES:  Principal Problem:   Syncope Active Problems:   Human immunodeficiency virus (HIV) disease ([place])   Insulin dependent type 2 diabetes mellitus, uncontrolled ([place])   ANEMIA, IRON DEFICIENCY   Migraine headache   Flu-like symptoms   Acute diarrhea   RUQ abdominal pain   RECOMMENDATIONS FOR OUTPATIENT FOLLOW UP: 1. Patient instructed to follow-up with her primary care provider in one week   DISCHARGE CONDITION: fair  Diet recommendation: As before  [facility] Weights   08/14/15 2115 08/15/15 0512 08/16/15 0629  Weight: 106.958 kg (235 lb 12.8 oz) 110.814 kg (244 lb 4.8 oz) 112 kg (246 lb 14.6 oz)    INITIAL HISTORY: 45 year old African-American female with a past medical history of HIV, diabetes on insulin, migraine headaches, presented after a syncopal episode at home. For few days prior to this admission she had been having cough with greenish expectoration along with fever. She was diagnosed with influenza by her primary care provider and was started on Tamiflu. Syncope was thought to be secondary to acute illness and hypovolemia, as patient was also complaining of diarrhea. She was hospitalized for further management.   HOSPITAL COURSE:   Left arm paresthesia On 3/10 patient woke up with the symptoms of left arm paresthesia. Neurological exam did not suggest acute CVA. She also mentions some neck pain. MRI brain did not show any acute stroke. Cervical spine MRI did suggest some disc protrusion. This could be responsible for her symptoms. She was asked to discuss this further with her PCP. She may benefit from outpatient physical therapy.  Syncope/orthostatic hypotension Most likely secondary to hypovolemia from recent illness and diarrhea. Patient  continued to improve. Orthostatics were checked and her blood pressure was noted to drop from sitting to standing position. She was given IV fluids. Since the reason for her syncope was thought to be more related to volume status rather than cardiac etiology, echocardiogram was not done.   Flu-like illness She was empirically started on Tamiflu by her primary care provider on 3/8. She may continue the same at home and complete her course. She was afebrile during this hospitalization. Chest x-ray does not show any infiltrates. WBC is normal. Hold off on antibacterial treatment for now. Chest x-ray was repeated and also does not show any infiltrates. Lactic acid was normal.   Acute diarrhea Appears to have resolved. Probably related to the above.   Mildly elevated creatinine This is likely secondary to dehydration from recent diarrhea and flulike illness. Creatinine has improved with hydration.   Microcytic anemia Patient has a known history of iron deficiency. Hemoglobin has been trending down over the past many months. No overt bleeding noted. Could've contributed to her syncope. She was transfused 2 units of blood. Hemoglobin has improved. Some degree of hemodilution is also present due to hydration.  HIV She is followed closely by the ID clinic. Continue her antiretroviral treatment. Recent CD4 count was greater than 500.  Right upper quadrant abdominal pain Not tender this morning. LFTs are normal. Lipase is normal. She may address this further with her PCP if pain recurs.  Insulin-dependent diabetes mellitus type 2 Continue with long-acting insulin along with sliding scale coverage. No recent HbA1c available in our system. This can be pursued as an outpatient.  Overall improved. Patient has been noted  to have anxiety episodes during this hospitalization. She's had arguments with her family. There does appear to be some psychological stressors which could be contributing to at least some  of her symptoms. She was asked to discuss these issues with her PCP. Medications stable for discharge.   PERTINENT LABS:  The results of significant diagnostics from this hospitalization (including imaging, microbiology, ancillary and laboratory) are listed below for reference.     Labs: Basic Metabolic Panel:  Recent Labs Lab 08/14/15 1602 08/15/15 0529 08/16/15 0635  NA 136 137 139  K 3.7 4.0 4.1  CL 104 107 106  CO2 24 25 26   GLUCOSE 149* 240* 160*  BUN 13 10 8   CREATININE 1.21* 1.07* 0.79  CALCIUM 8.4* 8.1* 8.3*   Liver Function Tests:  Recent Labs Lab 08/14/15 1918 08/15/15 0529  AST 22 17  ALT 13* 12*  ALKPHOS 71 82  BILITOT 0.2* 0.4  PROT 6.8 6.6  ALBUMIN 3.3* 3.0*    Recent Labs Lab 08/14/15 1918  LIPASE 44   CBC:  Recent Labs Lab 08/14/15 1602 08/15/15 0529 08/16/15 0635  WBC 5.6 5.9 5.6  HGB 7.1* 8.4* 8.1*  HCT 24.9* 28.2* 28.6*  MCV 59.7* 64.8* 64.3*  PLT 392 378 383   Cardiac Enzymes:  Recent Labs Lab 08/15/15 0854  TROPONINI <0.03   CBG:  Recent Labs Lab 08/15/15 1155 08/15/15 1833 08/15/15 2225 08/16/15 0642 08/16/15 0758  GLUCAP 177* 146* 202* 146* 157*     IMAGING STUDIES Dg Chest 2 View  08/15/2015  CLINICAL DATA:  Cough, flu-like symptoms for several days; 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 EXAM: CHEST  2 VIEW COMPARISON:  PA and lateral chest x-rays of August 13, 2008 and 27 July 2013 FINDINGS: The lungs are adequately inflated. There is no focal infiltrate. The interstitial markings are more conspicuous bilaterally today. There is no pleural effusion. The heart and pulmonary vascularity are normal. The mediastinum is normal in width. The bony thorax is unremarkable. IMPRESSION: Increased interstitial markings suggestive of acute bronchitic change. There is no alveolar pneumonia nor CHF. Electronically Signed   By: David  Martinique M.D.   On: 08/15/2015 08:02   Dg Chest 2 View  08/14/2015  CLINICAL DATA:  New Syncope x 1 day, pt  diagnosed with flu 2 days ago EXAM: CHEST  2 VIEW COMPARISON:  07/27/2013 FINDINGS: Mild enlargement of the cardiopericardial silhouette. Low lung volumes are present, causing crowding of the pulmonary vasculature. No definite edema. Lateral projection blurred by motion artifact, causing reduced sensitivity and specificity. No definite pleural effusion. IMPRESSION: 1. Mild enlargement of the cardiopericardial silhouette. 2. Low lung volumes are present, causing crowding of the pulmonary vasculature. Electronically Signed   By: Van Clines M.D.   On: 08/14/2015 15:24   Ct Head Wo Contrast  08/14/2015  CLINICAL DATA:  PT PASSED OUT TODAY APPARENTLY DUE TO LOW HEMAGLOBIN. SHE IS NOW RECEIVING BLOOD. DOES NOT THINK SHE HIT HER HEAD BUT DOES NOT REMEMBER ANYTHING. EXAM: CT HEAD WITHOUT CONTRAST TECHNIQUE: Contiguous axial images were obtained from the base of the skull through the vertex without intravenous contrast. COMPARISON:  02/22/2010 FINDINGS: The brainstem, cerebellum, cerebral peduncles, thalami, basal ganglia, basilar cisterns, and ventricular system appear within normal limits. No intracranial hemorrhage, mass lesion, or acute CVA. Mucosal thickening in the nasal cavity. Chronic ethmoid, sphenoid, and maxillary sinusitis. No middle ear fluid; mastoid air cells appear clear. IMPRESSION: 1. No acute intracranial findings. 2. Chronic ethmoid, sphenoid, and maxillary sinusitis. Electronically Signed  By: Van Clines M.D.   On: 08/14/2015 18:14   Mr Brain Wo Contrast  08/15/2015  CLINICAL DATA:  HIV positive patient with history of hypertension. Worsening of dizziness. Blackout spell, found on the floor. EXAM: MRI HEAD WITHOUT CONTRAST TECHNIQUE: Multiplanar, multiecho pulse sequences of the brain and surrounding structures were obtained without intravenous contrast. COMPARISON:  Head CT 08/14/2015 FINDINGS: The brain does not show accelerated atrophy. Diffusion imaging does not show any acute  or subacute infarction. The brainstem and cerebellum are normal. Cerebral hemispheres are normal except for low level T2 signal in the white matter that can be seen in HIV infection. No evidence of mass lesion, hemorrhage, hydrocephalus or extra-axial collection. No pituitary mass. There are inflammatory changes throughout the paranasal sinuses. No fluid in the middle ears or mastoids. Major vessels at the base the brain show flow. IMPRESSION: No acute intracranial finding. Low level T2 signal of the white matter which can be seen in HIV infection. Mucosal inflammatory changes of the paranasal sinuses. Electronically Signed   By: Nelson Chimes M.D.   On: 08/15/2015 15:17   Mr Cervical Spine Wo Contrast  08/15/2015  CLINICAL DATA:  Neck pain beginning yesterday after a fall while in the kitchen. Patient blacked out. Persistent weakness. Left-sided headache. Initial encounter. EXAM: MRI CERVICAL SPINE WITHOUT CONTRAST TECHNIQUE: Multiplanar, multisequence MR imaging of the cervical spine was performed. No intravenous contrast was administered. COMPARISON:  Cervical spine CT 01/11/2008 FINDINGS: The study is mildly motion degraded throughout despite repeating some sequences. There is straightening of the normal cervical lordosis. There is no significant listhesis. Vertebral body heights are preserved. Vertebral bone marrow signal in the visualized spine is mildly diminished diffusely and may be related to patient's underlying anemia and/or HIV infection. No vertebral marrow edema is seen. No paraspinal soft tissue edema suggestive of acute soft tissue injury is seen. Subcentimeter T2 hyperintense foci in the submental region likely represent small lymph nodes. Paranasal sinus disease is more fully evaluated on recent head CT and head MRI. The cervical spinal cord is normal in signal. C2-3:  Negative. C3-4: Small left paracentral disc extrusion results in mild spinal stenosis with a slight impression on the ventral  spinal cord. No neural foraminal stenosis. C4-5: Small central disc extrusion results in mild spinal stenosis without significant spinal cord mass effect. No significant neural foraminal stenosis. C5-6:  Negative. C6-7: Left paracentral disc protrusion results in mild spinal stenosis with at most a minimal impression on the left ventral spinal cord. There is mild left neural foraminal narrowing due to disc and osteophyte. C7-T1:  Negative. 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 Electronically Signed   By: Logan Bores M.D.   On: 08/15/2015 15:46    DISCHARGE EXAMINATION: Filed Vitals:   08/15/15 1616 08/15/15 2228 08/16/15 0156 08/16/15 0629  BP: 144/104 125/68 117/68 133/77  Pulse: 89 82 77 79  Temp: 97.7 F (36.5 C) 98.9 F (37.2 C) 98 F (36.7 C) 99.1 F (37.3 C)  TempSrc: Oral Oral Oral Oral  Resp: 16 16 16 16   Height:      Weight:    112 kg (246 lb 14.6 oz)  SpO2: 99% 98% 100% 95%   General appearance: alert, cooperative, appears stated age and no distress Resp: clear to auscultation bilaterally Cardio: regular rate and rhythm, S1, S2 normal, no murmur, click, rub or gallop GI: soft, non-tender; bowel sounds normal; no masses,  no organomegaly Extremities: extremities  normal, atraumatic, no cyanosis or edema Awake and alert. Oriented 3. No focal neurological deficits.  DISPOSITION: Home  Discharge Instructions    Call MD for:  difficulty breathing, headache or visual disturbances    Complete by:  As directed      Call MD for:  extreme fatigue    Complete by:  As directed      Call MD for:  persistant dizziness or light-headedness    Complete by:  As directed      Call MD for:  persistant nausea and vomiting    Complete by:  As directed      Call MD for:  severe uncontrolled pain    Complete by:  As directed      Call MD for:  temperature >100.4    Complete by:  As directed      Diet - low sodium heart healthy    Complete  by:  As directed      Discharge instructions    Complete by:  As directed   Please follow up with your PCP next week to further discuss the arm numbness. Finish the course of Tamiflu prescribed to you by your PCP.  You were cared for by a hospitalist during your hospital stay. If you have any questions about your discharge medications or the care you received while you were in the hospital after you are discharged, you can call the unit and asked to speak with the hospitalist on call if the hospitalist that took care of you is not available. Once you are discharged, your primary care physician will handle any further medical issues. Please note that NO REFILLS for any discharge medications will be authorized once you are discharged, as it is imperative that you return to your primary care physician (or establish a relationship with a primary care physician if you do not have one) for your aftercare needs so that they can reassess your need for medications and monitor your lab values. If you do not have a primary care physician, you can call [phone] for a physician referral.     Increase activity slowly    Complete by:  As directed            ALLERGIES:  Allergies  Allergen Reactions  . Cortisone Itching    REACTION: "nerves"  . Hydrocodone-Acetaminophen Itching    REACTION: "nerves"  . Morphine Itching    REACTION: "nerves"     Discharge Medication List as of 08/16/2015 11:17 AM    START taking these medications   Details  fluticasone (FLONASE) 50 MCG/ACT nasal spray Place 2 sprays into both nostrils daily., Starting 08/15/2015, Until Discontinued, Print    oseltamivir (TAMIFLU) 75 MG capsule Take 1 capsule (75 mg total) by mouth 2 (two) times daily., Starting 08/15/2015, Until Discontinued, Print      CONTINUE these medications which have NOT CHANGED   Details  elvitegravir-cobicistat-emtricitabine-tenofovir (GENVOYA) 150-150-200-10 MG TABS tablet Take 1 tablet by mouth daily with  breakfast., Starting 07/30/2015, Until Discontinued, Normal    ibuprofen (ADVIL,MOTRIN) 800 MG tablet Take 1 tablet (800 mg total) by mouth every 8 (eight) hours as needed., Starting 01/10/2014, Until Discontinued, Print    Insulin Glargine (TOUJEO SOLOSTAR [place]) Inject 34 Units into the skin every morning., Until Discontinued, Historical Med    metFORMIN (GLUCOPHAGE-XR) 500 MG 24 hr tablet Take 500 mg by mouth 2 (two) times daily. , Starting 05/22/2014, Until Discontinued, Historical Med    permethrin (ELIMITE) 5 % cream Apply  1 application topically daily. , Starting 05/22/2014, Until Discontinued, Historical Med    promethazine (PHENERGAN) 25 MG tablet Take 25 mg by mouth every 6 (six) hours as needed for nausea or vomiting. , Starting 06/14/2014, Until Discontinued, Historical Med    traMADol (ULTRAM) 50 MG tablet Take 2 tablets (100 mg total) by mouth every 6 (six) hours as needed for severe pain., Starting 01/10/2014, Until Discontinued, Print    traZODone (DESYREL) 50 MG tablet Take 50 mg by mouth at bedtime. , Starting 06/14/2014, Until Discontinued, Historical Med      STOP taking these medications     minocycline (MINOCIN,DYNACIN) 100 MG capsule      naproxen (NAPROSYN) 500 MG tablet      ondansetron (ZOFRAN ODT) 8 MG disintegrating tablet      sulfamethoxazole-trimethoprim (BACTRIM DS,SEPTRA DS) 800-160 MG per tablet      SUMAtriptan (IMITREX) 100 MG tablet        Follow-up Information    Follow up with Harvie Junior, MD. Schedule an appointment as soon as possible for a visit in 1 week.   Specialty:  Family Medicine   Why:  post hospitalization follow up   Contact information:   [place] [address] [phone]       [place]: 69 minutes  [facility] Pager [phone]  08/16/2015, 3:23 PM

## 2015-08-16 NOTE — Discharge Instructions (Signed)

## 2015-09-08 DIAGNOSIS — M25562 Pain in left knee: Secondary | ICD-10-CM | POA: Diagnosis not present

## 2015-09-08 DIAGNOSIS — M25561 Pain in right knee: Secondary | ICD-10-CM | POA: Diagnosis not present

## 2015-09-08 DIAGNOSIS — M545 Low back pain: Secondary | ICD-10-CM | POA: Diagnosis not present

## 2015-10-24 DIAGNOSIS — M545 Low back pain: Secondary | ICD-10-CM | POA: Diagnosis not present

## 2015-10-29 ENCOUNTER — Encounter: Payer: Self-pay | Admitting: Infectious Diseases

## 2015-10-29 ENCOUNTER — Ambulatory Visit (INDEPENDENT_AMBULATORY_CARE_PROVIDER_SITE_OTHER): Payer: Medicare Other | Admitting: Infectious Diseases

## 2015-10-29 VITALS — BP 131/77 | HR 90 | Temp 98.2°F | Ht 69.0 in | Wt 235.0 lb

## 2015-10-29 DIAGNOSIS — R21 Rash and other nonspecific skin eruption: Secondary | ICD-10-CM | POA: Diagnosis not present

## 2015-10-29 DIAGNOSIS — Z794 Long term (current) use of insulin: Secondary | ICD-10-CM | POA: Diagnosis not present

## 2015-10-29 DIAGNOSIS — IMO0002 Reserved for concepts with insufficient information to code with codable children: Secondary | ICD-10-CM

## 2015-10-29 DIAGNOSIS — E1165 Type 2 diabetes mellitus with hyperglycemia: Secondary | ICD-10-CM

## 2015-10-29 DIAGNOSIS — B20 Human immunodeficiency virus [HIV] disease: Secondary | ICD-10-CM | POA: Diagnosis not present

## 2015-10-29 DIAGNOSIS — N920 Excessive and frequent menstruation with regular cycle: Secondary | ICD-10-CM | POA: Diagnosis not present

## 2015-10-29 DIAGNOSIS — R1011 Right upper quadrant pain: Secondary | ICD-10-CM

## 2015-10-29 NOTE — Assessment & Plan Note (Signed)
The etiology of this is unclear- she states she will f/u with derm.

## 2015-10-29 NOTE — Assessment & Plan Note (Signed)
Last glc 160.  She will f/u with PCP

## 2015-10-29 NOTE — Progress Notes (Signed)
   Subjective:    Patient ID: Brenda [name], female    DOB: 12-Jan-1971, 45 y.o.   MRN: [id number]  HPI 45 yo F with hx of HIV+, DM2 (on insulin), adm 08-2015 after flu like illness, developed dehydration.  Today complains of abd pain. Has not had BM for 4 days.  Taking genvoya without problems.  Is using permethrin for her skin changes. Was told her skin changes are due to DM and mosquito bites. Has been seen by Derm.   HIV 1 RNA QUANT (copies/mL)  Date Value  07/30/2015 <20  05/29/2015 <20  06/11/2014 <20   CD4 T CELL ABS (/uL)  Date Value  07/30/2015 570  05/29/2015 680  06/11/2014 760   Has not had ophtho yet this year. occas blurriness L eye.  occas L leg tingling when she urinates.  Has not had PAP this year. Sees Dr Raphael Gibney.    Review of Systems  Constitutional: Negative for appetite change and unexpected weight change.  Eyes: Positive for visual disturbance.  Gastrointestinal: Positive for abdominal pain and constipation.  Genitourinary: Negative for difficulty urinating.  Neurological: Negative for numbness.       Objective:   Physical Exam  Constitutional: She appears well-developed and well-nourished.  HENT:  Mouth/Throat: No oropharyngeal exudate.  Eyes: EOM are normal. Pupils are equal, round, and reactive to light.  Neck: Neck supple.  Cardiovascular: Normal rate, regular rhythm and normal heart sounds.   Pulmonary/Chest: Effort normal and breath sounds normal.  Abdominal: Soft. Bowel sounds are normal. There is no tenderness. There is no rebound.  Musculoskeletal: She exhibits no edema.  Lymphadenopathy:    She has no cervical adenopathy.  Skin:          Assessment & Plan:

## 2015-10-29 NOTE — Assessment & Plan Note (Addendum)
She is constipated. Will try MgCitrate OTC.

## 2015-10-29 NOTE — Assessment & Plan Note (Signed)
States she is still having heavy periods. I encouraged her to f/u with her Gyn for repeat shot or even hysterectomy.

## 2015-10-29 NOTE — Assessment & Plan Note (Signed)
She is doing very well. Will continue her genvoya.  She is given condoms today.  Will see her back in 6 months.

## 2015-11-27 DIAGNOSIS — E0865 Diabetes mellitus due to underlying condition with hyperglycemia: Secondary | ICD-10-CM | POA: Diagnosis not present

## 2015-11-27 DIAGNOSIS — H2511 Age-related nuclear cataract, right eye: Secondary | ICD-10-CM | POA: Diagnosis not present

## 2015-11-27 DIAGNOSIS — H538 Other visual disturbances: Secondary | ICD-10-CM | POA: Diagnosis not present

## 2015-12-02 DIAGNOSIS — M545 Low back pain: Secondary | ICD-10-CM | POA: Diagnosis not present

## 2015-12-16 DIAGNOSIS — R87610 Atypical squamous cells of undetermined significance on cytologic smear of cervix (ASC-US): Secondary | ICD-10-CM | POA: Diagnosis not present

## 2015-12-16 DIAGNOSIS — Z01411 Encounter for gynecological examination (general) (routine) with abnormal findings: Secondary | ICD-10-CM | POA: Diagnosis not present

## 2015-12-16 DIAGNOSIS — Z124 Encounter for screening for malignant neoplasm of cervix: Secondary | ICD-10-CM | POA: Diagnosis not present

## 2015-12-16 DIAGNOSIS — R102 Pelvic and perineal pain: Secondary | ICD-10-CM | POA: Diagnosis not present

## 2015-12-16 DIAGNOSIS — N736 Female pelvic peritoneal adhesions (postinfective): Secondary | ICD-10-CM | POA: Diagnosis not present

## 2015-12-16 DIAGNOSIS — Z6835 Body mass index (BMI) 35.0-35.9, adult: Secondary | ICD-10-CM | POA: Diagnosis not present

## 2015-12-16 DIAGNOSIS — Z1231 Encounter for screening mammogram for malignant neoplasm of breast: Secondary | ICD-10-CM | POA: Diagnosis not present

## 2015-12-22 DIAGNOSIS — M5106 Intervertebral disc disorders with myelopathy, lumbar region: Secondary | ICD-10-CM | POA: Diagnosis not present

## 2016-01-02 DIAGNOSIS — M5116 Intervertebral disc disorders with radiculopathy, lumbar region: Secondary | ICD-10-CM | POA: Diagnosis not present

## 2016-01-02 DIAGNOSIS — M545 Low back pain: Secondary | ICD-10-CM | POA: Diagnosis not present

## 2016-01-02 DIAGNOSIS — M5106 Intervertebral disc disorders with myelopathy, lumbar region: Secondary | ICD-10-CM | POA: Diagnosis not present

## 2016-01-15 DIAGNOSIS — G894 Chronic pain syndrome: Secondary | ICD-10-CM | POA: Diagnosis not present

## 2016-02-13 DIAGNOSIS — G894 Chronic pain syndrome: Secondary | ICD-10-CM | POA: Diagnosis not present

## 2016-03-11 ENCOUNTER — Other Ambulatory Visit: Payer: Self-pay | Admitting: Obstetrics and Gynecology

## 2016-04-20 DIAGNOSIS — H2511 Age-related nuclear cataract, right eye: Secondary | ICD-10-CM | POA: Diagnosis not present

## 2016-04-20 DIAGNOSIS — H538 Other visual disturbances: Secondary | ICD-10-CM | POA: Diagnosis not present

## 2016-04-20 DIAGNOSIS — E0865 Diabetes mellitus due to underlying condition with hyperglycemia: Secondary | ICD-10-CM | POA: Diagnosis not present

## 2016-06-05 ENCOUNTER — Encounter (HOSPITAL_COMMUNITY): Payer: Self-pay

## 2016-06-05 ENCOUNTER — Emergency Department (HOSPITAL_COMMUNITY)
Admission: EM | Admit: 2016-06-05 | Discharge: 2016-06-05 | Disposition: A | Discharge status: Home / Self Care | Payer: Medicare Other | Attending: Emergency Medicine | Admitting: Emergency Medicine

## 2016-06-05 DIAGNOSIS — E119 Type 2 diabetes mellitus without complications: Secondary | ICD-10-CM | POA: Diagnosis not present

## 2016-06-05 DIAGNOSIS — Z79899 Other long term (current) drug therapy: Secondary | ICD-10-CM | POA: Diagnosis not present

## 2016-06-05 DIAGNOSIS — Y999 Unspecified external cause status: Secondary | ICD-10-CM | POA: Diagnosis not present

## 2016-06-05 DIAGNOSIS — T300 Burn of unspecified body region, unspecified degree: Secondary | ICD-10-CM | POA: Diagnosis not present

## 2016-06-05 DIAGNOSIS — Y9384 Activity, sleeping: Secondary | ICD-10-CM | POA: Insufficient documentation

## 2016-06-05 DIAGNOSIS — T2103XA Burn of unspecified degree of upper back, initial encounter: Secondary | ICD-10-CM | POA: Diagnosis not present

## 2016-06-05 DIAGNOSIS — I10 Essential (primary) hypertension: Secondary | ICD-10-CM | POA: Diagnosis not present

## 2016-06-05 DIAGNOSIS — X19XXXA Contact with other heat and hot substances, initial encounter: Secondary | ICD-10-CM | POA: Insufficient documentation

## 2016-06-05 DIAGNOSIS — Y929 Unspecified place or not applicable: Secondary | ICD-10-CM | POA: Insufficient documentation

## 2016-06-05 DIAGNOSIS — Z794 Long term (current) use of insulin: Secondary | ICD-10-CM | POA: Insufficient documentation

## 2016-06-05 DIAGNOSIS — S39012D Strain of muscle, fascia and tendon of lower back, subsequent encounter: Secondary | ICD-10-CM | POA: Insufficient documentation

## 2016-06-05 MED ORDER — HYDROMORPHONE HCL 2 MG/ML IJ SOLN
1.0000 mg | Freq: Once | INTRAMUSCULAR | Status: AC
  Administered 2016-06-05: 1 mg via INTRAMUSCULAR
  Filled 2016-06-05: qty 1

## 2016-06-05 MED ORDER — ONDANSETRON 4 MG PO TBDP
4.0000 mg | ORAL_TABLET | Freq: Once | ORAL | Status: AC
  Administered 2016-06-05: 4 mg via ORAL
  Filled 2016-06-05: qty 1

## 2016-06-05 NOTE — ED Provider Notes (Signed)
[facility] DEPT Provider Note   CSN: [id number] Arrival date & time: 06/05/16  1708     History   Chief Complaint Chief Complaint  Patient presents with  . Back Pain    HPI   Blood pressure 152/85, pulse 100, temperature 97.7 F (36.5 C), temperature source Oral, resp. rate 20, SpO2 99 %.  Brenda [name] is a 45 y.o. female with past medical history significant for HIV (last CD4 counts 10 months ago is over 500) complaining of right-sided low back pain onset several weeks ago after twisting and lifting boxes at work. She states she is left-handed and she has to twist onto the right side to get the boxes. She saw her primary care doctor who gave her a shot in the office and this was particularly helpful. She returned to work and in the same twisting motion irritated her back again, pain is severe, she is taking Percocet at home with little relief. She denies fevers, chills, numbness, weakness, incontinence. States pain is worse with position in certain movements. She has a burn on her mid back which she states is from sleeping on a heating pad. States her last tetanus shot was within the last year.  Past Medical History:  Diagnosis Date  . Arthritis    knees  . Chronic folliculitis   . Diabetes mellitus   . Hemorrhoids   . HIV disease ([place])   . Hypertension   . Iron deficiency anemia due to chronic blood loss   . Irregular menstrual cycle   . 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 Migraines   . Obesity   . Pneumothorax, traumatic     Patient Active Problem List   Diagnosis Date Noted  . Syncope 08/14/2015  . Flu-like symptoms 08/14/2015  . Acute diarrhea 08/14/2015  . RUQ abdominal pain 08/14/2015  . Rash and nonspecific skin eruption 06/30/2011  . Obesity   . MENORRHAGIA 09/04/2008  . ANEMIA, IRON DEFICIENCY 01/02/2008  . HEMORRHOIDS, WITH BLEEDING 11/28/2007  . Insulin dependent type 2 diabetes mellitus, uncontrolled ([place]) 02/20/2007  . Human immunodeficiency  virus (HIV) disease ([place]) 03/11/2006  . 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 Migraine headache 03/11/2006  . FOLLICULITIS 123456    Past Surgical History:  Procedure Laterality Date  . CHEST TUBE INSERTION    . HYSTEROSCOPY W/D&C  Removal of Endometrial Polyp   Dr. Raphael Gibney 2011  . LAPAROSCOPIC LYSIS OF ADHESIONS  01/10/2014   Procedure: LAPAROSCOPIC LYSIS OF ADHESIONS;  Surgeon: Ena Dawley, MD;  Location: [facility];  Service: Gynecology;;  . LAPAROSCOPY N/A 01/10/2014   Procedure: LAPAROSCOPY OPERATIVE;  Surgeon: Ena Dawley, MD;  Location: [facility];  Service: Gynecology;  Laterality: N/A;  . TUBAL LIGATION    . UMBILICAL HERNIA REPAIR  01/10/2014   Procedure: HERNIA REPAIR UMBILICAL ADULT;  Surgeon: Ena Dawley, MD;  Location: [facility];  Service: Gynecology;;    OB History    Gravida Para Term Preterm AB Living   8 4 4   4 4    SAB TAB Ectopic Multiple Live Births   4               Home Medications    Prior to Admission medications   Medication Sig Start Date End Date Taking? Authorizing Provider  elvitegravir-cobicistat-emtricitabine-tenofovir (GENVOYA) 150-150-200-10 MG TABS tablet Take 1 tablet by mouth daily with breakfast. 07/30/15   Campbell Riches, MD  fluticasone [facility]) 50 MCG/ACT nasal spray Place 2 sprays into both nostrils daily. 08/15/15  Bonnielee Haff, MD  Insulin Glargine (TOUJEO SOLOSTAR [place]) Inject 34 Units into the skin every morning.    Historical Provider, MD  metFORMIN (GLUCOPHAGE-XR) 500 MG 24 hr tablet Take 500 mg by mouth 2 (two) times daily.  05/22/14   Historical Provider, MD  permethrin (ELIMITE) 5 % cream Apply 1 application topically daily.  05/22/14   Historical Provider, MD  promethazine (PHENERGAN) 25 MG tablet Take 25 mg by mouth every 6 (six) hours as needed for nausea or vomiting.  06/14/14   Historical Provider, MD    Family History Family History  Problem Relation Age of Onset  . Diabetes Mother   . Heart failure Father   .  Diabetes Father   . Heart failure Brother   . Colon cancer      neg hx.    Social History Social History  Substance Use Topics  . Smoking status: Never Smoker  . Smokeless tobacco: Never Used  . Alcohol use No     Allergies   Cortisone; Hydrocodone-acetaminophen; and Morphine   Review of Systems Review of Systems  10 systems reviewed and found to be negative, except as noted in the HPI.   Physical Exam Updated Vital Signs BP 157/92 (BP Location: Left Arm)   Pulse 90   Temp 97.7 F (36.5 C) (Oral)   Resp 20   SpO2 99%   Physical Exam  Constitutional: She appears well-developed and well-nourished.  HENT:  Head: Normocephalic.  Eyes: Conjunctivae are normal.  Neck: Normal range of motion.  Cardiovascular: Normal rate, regular rhythm and intact distal pulses.   Pulmonary/Chest: Effort normal.  Abdominal: Soft. There is no tenderness.  Neurological: She is alert.  No point tenderness to percussion of lumbar spinal processes.  No TTP or paraspinal muscular spasm. Strength is 5 out of 5 to bilateral lower extremities at hip and knee; extensor hallucis longus 5 out of 5. Ankle strength 5 out of 5, no clonus, neurovascularly intact. No saddle anaesthesia. Patellar reflexes are 2+ bilaterally.      Psychiatric: She has a normal mood and affect.  Nursing note and vitals reviewed.    ED Treatments / Results  Labs (all labs ordered are listed, but only abnormal results are displayed) Labs Reviewed - No data to display  EKG  EKG Interpretation None       Radiology No results found.  Procedures Procedures (including critical care time)  Medications Ordered in ED Medications  HYDROmorphone (DILAUDID) injection 1 mg (1 mg Intramuscular Given 06/05/16 1938)  ondansetron (ZOFRAN-ODT) disintegrating tablet 4 mg (4 mg Oral Given 06/05/16 1937)     Initial Impression / Assessment and Plan / ED Course  I have reviewed the triage vital signs and the nursing  notes.  Pertinent labs & imaging results that were available during my care of the patient were reviewed by me and considered in my medical decision making (see chart for details).  Clinical Course     Vitals:   06/05/16 1714 06/05/16 2008  BP: 152/85 157/92  Pulse: 100 90  Resp: 20   Temp: 97.7 F (36.5 C)   TempSrc: Oral   SpO2: 99% 99%    Medications  HYDROmorphone (DILAUDID) injection 1 mg (1 mg Intramuscular Given 06/05/16 1938)  ondansetron (ZOFRAN-ODT) disintegrating tablet 4 mg (4 mg Oral Given 06/05/16 1937)    Brenda [name] is 45 y.o. female presenting with  back pain.  No neurological deficits and normal neuro exam.  Patient can walk but states  is painful.  No loss of bowel or bladder control.  No concern for cauda equina.  No fever, night sweats, weight loss, h/o cancer, IVDU.  RICE protocol and pain medicine indicated and discussed with patient.  Evaluation does not show pathology that would require ongoing emergent intervention or inpatient treatment. Pt is hemodynamically stable and mentating appropriately. Discussed findings and plan with patient/guardian, who agrees with care plan. All questions answered. Return precautions discussed and outpatient follow up given.      Final Clinical Impressions(s) / ED Diagnoses   Final diagnoses:  Lumbar strain, subsequent encounter  Thermal burn    New Prescriptions Discharge Medication List as of 06/05/2016  7:52 PM       Monico Blitz, PA-C 06/05/16 2029    Davonna Belling, MD 06/05/16 2310

## 2016-06-05 NOTE — Discharge Instructions (Signed)
For pain control please take ibuprofen (also known as Motrin or Advil) 800mg  (this is normally 4 over the counter pills) 3 times a day  for 5 days. Take with food to minimize stomach irritation.  Wash the affected area with soap and water and apply a thin layer of topical antibiotic ointment. Do this every 12 hours.   Do not use rubbing alcohol or hydrogen peroxide.                        Look for signs of infection: if you see redness, if the area becomes warm, if pain increases sharply, there is discharge (pus), if red streaks appear or you develop fever or vomiting, RETURN immediately to the Emergency Department  for a recheck.   Please follow with your primary care doctor in the next 2 days for a check-up. They must obtain records for further management.   Do not hesitate to return to the Emergency Department for any new, worsening or concerning symptoms.

## 2016-06-05 NOTE — ED Triage Notes (Signed)
Onset several weeks pt lower hurt back lifting box, pain radiates into right buttock.  Pt received shot at Dr. Fredderick Erb office that helped initially. Unable to sit or stand for long periods.  Next appt with Dr. Oneita Kras 06-08-16.

## 2016-06-30 ENCOUNTER — Emergency Department (HOSPITAL_COMMUNITY): Payer: Medicare Other

## 2016-06-30 ENCOUNTER — Encounter (HOSPITAL_COMMUNITY): Payer: Self-pay | Admitting: Emergency Medicine

## 2016-06-30 ENCOUNTER — Emergency Department (HOSPITAL_COMMUNITY)
Admission: EM | Admit: 2016-06-30 | Discharge: 2016-06-30 | Disposition: A | Discharge status: Home / Self Care | Payer: Medicare Other | Attending: Emergency Medicine | Admitting: Emergency Medicine

## 2016-06-30 DIAGNOSIS — Z7982 Long term (current) use of aspirin: Secondary | ICD-10-CM | POA: Diagnosis not present

## 2016-06-30 DIAGNOSIS — J069 Acute upper respiratory infection, unspecified: Secondary | ICD-10-CM

## 2016-06-30 DIAGNOSIS — E119 Type 2 diabetes mellitus without complications: Secondary | ICD-10-CM | POA: Insufficient documentation

## 2016-06-30 DIAGNOSIS — I10 Essential (primary) hypertension: Secondary | ICD-10-CM | POA: Insufficient documentation

## 2016-06-30 DIAGNOSIS — R404 Transient alteration of awareness: Secondary | ICD-10-CM | POA: Diagnosis not present

## 2016-06-30 DIAGNOSIS — Z794 Long term (current) use of insulin: Secondary | ICD-10-CM | POA: Insufficient documentation

## 2016-06-30 DIAGNOSIS — R531 Weakness: Secondary | ICD-10-CM | POA: Diagnosis not present

## 2016-06-30 DIAGNOSIS — R05 Cough: Secondary | ICD-10-CM | POA: Diagnosis present

## 2016-06-30 LAB — CBC WITH DIFFERENTIAL/PLATELET
BASOS ABS: 0.1 10*3/uL (ref 0.0–0.1)
Basophils Relative: 1 %
EOS ABS: 0.4 10*3/uL (ref 0.0–0.7)
Eosinophils Relative: 6 %
HEMATOCRIT: 30.5 % — AB (ref 36.0–46.0)
Hemoglobin: 9.1 g/dL — ABNORMAL LOW (ref 12.0–15.0)
LYMPHS ABS: 1.9 10*3/uL (ref 0.7–4.0)
LYMPHS PCT: 26 %
MCH: 20.1 pg — ABNORMAL LOW (ref 26.0–34.0)
MCHC: 29.8 g/dL — ABNORMAL LOW (ref 30.0–36.0)
MCV: 67.3 fL — ABNORMAL LOW (ref 78.0–100.0)
MONOS PCT: 8 %
Monocytes Absolute: 0.6 10*3/uL (ref 0.1–1.0)
Neutro Abs: 4.3 10*3/uL (ref 1.7–7.7)
Neutrophils Relative %: 59 %
Platelets: 452 10*3/uL — ABNORMAL HIGH (ref 150–400)
RBC: 4.53 MIL/uL (ref 3.87–5.11)
RDW: 17.6 % — AB (ref 11.5–15.5)
WBC: 7.3 10*3/uL (ref 4.0–10.5)

## 2016-06-30 LAB — URINALYSIS, ROUTINE W REFLEX MICROSCOPIC
Bilirubin Urine: NEGATIVE
Glucose, UA: 50 mg/dL — AB
Hgb urine dipstick: NEGATIVE
KETONES UR: NEGATIVE mg/dL
LEUKOCYTES UA: NEGATIVE
NITRITE: NEGATIVE
PH: 5 (ref 5.0–8.0)
PROTEIN: NEGATIVE mg/dL
Specific Gravity, Urine: 1.03 (ref 1.005–1.030)

## 2016-06-30 LAB — COMPREHENSIVE METABOLIC PANEL
ALBUMIN: 4.3 g/dL (ref 3.5–5.0)
ALK PHOS: 100 U/L (ref 38–126)
ALT: 14 U/L (ref 14–54)
AST: 17 U/L (ref 15–41)
Anion gap: 6 (ref 5–15)
BUN: 21 mg/dL — ABNORMAL HIGH (ref 6–20)
CHLORIDE: 101 mmol/L (ref 101–111)
CO2: 29 mmol/L (ref 22–32)
Calcium: 8.9 mg/dL (ref 8.9–10.3)
Creatinine, Ser: 0.83 mg/dL (ref 0.44–1.00)
GFR calc non Af Amer: >60 mL/min (ref >60)
GLUCOSE: 197 mg/dL — AB (ref 65–99)
POTASSIUM: 3.9 mmol/L (ref 3.5–5.1)
SODIUM: 136 mmol/L (ref 135–145)
Total Bilirubin: 0.5 mg/dL (ref 0.3–1.2)
Total Protein: 8.3 g/dL — ABNORMAL HIGH (ref 6.5–8.1)

## 2016-06-30 LAB — I-STAT CG4 LACTIC ACID, ED: LACTIC ACID, VENOUS: 0.54 mmol/L (ref 0.5–1.9)

## 2016-06-30 MED ORDER — FENTANYL CITRATE (PF) 100 MCG/2ML IJ SOLN
100.0000 ug | Freq: Once | INTRAMUSCULAR | Status: AC
Start: 2016-06-30 — End: 2016-06-30
  Administered 2016-06-30: 100 ug via INTRAVENOUS
  Filled 2016-06-30: qty 2

## 2016-06-30 MED ORDER — KETOROLAC TROMETHAMINE 30 MG/ML IJ SOLN
30.0000 mg | Freq: Once | INTRAMUSCULAR | Status: AC
  Administered 2016-06-30: 30 mg via INTRAVENOUS
  Filled 2016-06-30: qty 1

## 2016-06-30 MED ORDER — ACETAMINOPHEN-CODEINE 120-12 MG/5ML PO SOLN: 10.0000 mL | ORAL | 0 refills | Status: DC | PRN

## 2016-06-30 MED ORDER — SODIUM CHLORIDE 0.9 % IV BOLUS (SEPSIS)
1000.0000 mL | Freq: Once | INTRAVENOUS | Status: AC
  Administered 2016-06-30: 1000 mL via INTRAVENOUS

## 2016-06-30 MED ORDER — GUAIFENESIN ER 1200 MG PO TB12: 1.0000 | ORAL_TABLET | Freq: Two times a day (BID) | ORAL | 0 refills | Status: DC

## 2016-06-30 NOTE — Discharge Instructions (Signed)
Return here as needed.  Follow-up with your primary care doctor, increase your fluid intake and rest as much as possible °

## 2016-06-30 NOTE — ED Triage Notes (Signed)
Per EMS, patient is complaining of chills, generalized weakness, and productive cough x1 day.  Denies fever, nausea, vomiting, diarrhea.

## 2016-06-30 NOTE — ED Provider Notes (Signed)
MSE was initiated and I personally evaluated the patient and placed orders (if any) at  5:54 PM on June 30, 2016.  PMHx of HIV (Last CD4 count is 31) and DM who presents to the Emergency Department complaining of headache, chills, nausea, generalized body aches, leg cramps. Denies sore throat, cough.  Wants to be checked for anemia.   Labs, CXR, UA ordered, pt to move to main ED.  The patient appears stable so that the remainder of the MSE may be completed by another provider.   Clayton Bibles, PA-C 06/30/16 [place], MD 07/01/16 Benancio Deeds

## 2016-07-07 NOTE — ED Provider Notes (Signed)
[facility] DEPT Provider Note   CSN: [id number] Arrival date & time: 06/30/16  1533     History   Chief Complaint Chief Complaint  Patient presents with  . Flu Like Symptoms    HPI Brenda [name] is a 46 y.o. female.  HPI Patient presents to the emergency department with cough, body aches, sore throat, nasal congestion, chills and nausea.  The patient states that she has been taking over-the-counter medications without significant relief of her symptoms.  Patient states that she does have some mild headache, but does not feel similar to when she had meningitis.  Patient states nothing seems make her condition better or worse. The patient denies chest pain, shortness of breath, headache,blurred vision, neck pain, fever,  weakness, numbness, dizziness, anorexia, edema, abdominal pain, vomiting, diarrhea, rash, back pain, dysuria, hematemesis, bloody stool, near syncope, or syncope. Past Medical History:  Diagnosis Date  . Arthritis    knees  . Chronic folliculitis   . Diabetes mellitus   . Hemorrhoids   . HIV disease ([place])   . Hypertension   . Iron deficiency anemia due to chronic blood loss   . Irregular menstrual cycle   . 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 Migraines   . Obesity   . Pneumothorax, traumatic     Patient Active Problem List   Diagnosis Date Noted  . Syncope 08/14/2015  . Flu-like symptoms 08/14/2015  . Acute diarrhea 08/14/2015  . RUQ abdominal pain 08/14/2015  . Rash and nonspecific skin eruption 06/30/2011  . Obesity   . MENORRHAGIA 09/04/2008  . ANEMIA, IRON DEFICIENCY 01/02/2008  . HEMORRHOIDS, WITH BLEEDING 11/28/2007  . Insulin dependent type 2 diabetes mellitus, uncontrolled ([place]) 02/20/2007  . Human immunodeficiency virus (HIV) disease ([place]) 03/11/2006  . 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 Migraine headache 03/11/2006  . FOLLICULITIS 123456    Past Surgical History:  Procedure Laterality Date  . CHEST TUBE INSERTION    .  HYSTEROSCOPY W/D&C  Removal of Endometrial Polyp   Dr. Raphael Gibney 2011  . LAPAROSCOPIC LYSIS OF ADHESIONS  01/10/2014   Procedure: LAPAROSCOPIC LYSIS OF ADHESIONS;  Surgeon: Ena Dawley, MD;  Location: [facility];  Service: Gynecology;;  . LAPAROSCOPY N/A 01/10/2014   Procedure: LAPAROSCOPY OPERATIVE;  Surgeon: Ena Dawley, MD;  Location: [facility];  Service: Gynecology;  Laterality: N/A;  . TUBAL LIGATION    . UMBILICAL HERNIA REPAIR  01/10/2014   Procedure: HERNIA REPAIR UMBILICAL ADULT;  Surgeon: Ena Dawley, MD;  Location: [facility];  Service: Gynecology;;    OB History    Gravida Para Term Preterm AB Living   8 4 4   4 4    SAB TAB Ectopic Multiple Live Births   4               Home Medications    Prior to Admission medications   Medication Sig Start Date End Date Taking? Authorizing Provider  aspirin 325 MG tablet Take 325 mg by mouth daily.   Yes Historical Provider, MD  elvitegravir-cobicistat-emtricitabine-tenofovir (GENVOYA) 150-150-200-10 MG TABS tablet Take 1 tablet by mouth daily with breakfast. 07/30/15  Yes Campbell Riches, MD  glimepiride (AMARYL) 2 MG tablet Take 2 mg by mouth daily. 05/23/16  Yes Historical Provider, MD  Insulin Glargine (TOUJEO SOLOSTAR [place]) Inject 34 Units into the skin every morning.   Yes Historical Provider, MD  lisinopril-hydrochlorothiazide (PRINZIDE,ZESTORETIC) 20-25 MG tablet Take 1 tablet by mouth daily. 04/07/16  Yes Historical Provider, MD  metFORMIN (GLUCOPHAGE-XR) 500  MG 24 hr tablet Take 500 mg by mouth 2 (two) times daily.  05/22/14  Yes Historical Provider, MD  oxyCODONE-acetaminophen (PERCOCET) 10-325 MG tablet Take 1 tablet by mouth daily. 06/11/16  Yes Historical Provider, MD  promethazine (PHENERGAN) 25 MG tablet Take 25 mg by mouth every 6 (six) hours as needed for nausea/vomiting. 06/11/16  Yes Historical Provider, MD  zolpidem (AMBIEN) 10 MG tablet Take 10 mg by mouth at bedtime as needed for sleep. 06/11/16  Yes Historical Provider, MD    acetaminophen-codeine 120-12 MG/5ML solution Take 10 mLs by mouth every 4 (four) hours as needed for moderate pain. 06/30/16   Khalee Mazo, PA-C  fluticasone (FLONASE) 50 MCG/ACT nasal spray Place 2 sprays into both nostrils daily. Patient not taking: Reported on 06/30/2016 08/15/15   Bonnielee Haff, MD  Guaifenesin 1200 MG TB12 Take 1 tablet (1,200 mg total) by mouth 2 (two) times daily. 06/30/16   Dalia Heading, PA-C    Family History Family History  Problem Relation Age of Onset  . Diabetes Mother   . Heart failure Father   . Diabetes Father   . Heart failure Brother   . Colon cancer      neg hx.    Social History Social History  Substance Use Topics  . Smoking status: Never Smoker  . Smokeless tobacco: Never Used  . Alcohol use No     Allergies   Cortisone; Hydrocodone-acetaminophen; and Morphine   Review of Systems Review of Systems All other systems negative except as documented in the HPI. All pertinent positives and negatives as reviewed in the HPI.  Physical Exam Updated Vital Signs BP 118/77 (BP Location: Right Arm)   Pulse 89   Temp 97.8 F (36.6 C) (Oral)   Resp 20   SpO2 100%   Physical Exam  Constitutional: She is oriented to person, place, and time. She appears well-developed and well-nourished. No distress.  HENT:  Head: Normocephalic and atraumatic.  Mouth/Throat: Oropharynx is clear and moist.  Eyes: Pupils are equal, round, and reactive to light.  Neck: Normal range of motion. Neck supple. No neck rigidity. Normal range of motion present.  Cardiovascular: Normal rate, regular rhythm and normal heart sounds.  Exam reveals no gallop and no friction rub.   No murmur heard. Pulmonary/Chest: Effort normal and breath sounds normal. No respiratory distress. She has no wheezes.  Abdominal: Soft. Bowel sounds are normal. She exhibits no distension. There is no tenderness.  Neurological: She is alert and oriented to person, place, and time. She  exhibits normal muscle tone. Coordination normal.  Skin: Skin is warm and dry. No rash noted. No erythema.  Psychiatric: She has a normal mood and affect. Her behavior is normal.  Nursing note and vitals reviewed.    ED Treatments / Results  Labs (all labs ordered are listed, but only abnormal results are displayed) Labs Reviewed  CBC WITH DIFFERENTIAL/PLATELET - Abnormal; Notable for the following:       Result Value   Hemoglobin 9.1 (*)    HCT 30.5 (*)    MCV 67.3 (*)    MCH 20.1 (*)    MCHC 29.8 (*)    RDW 17.6 (*)    Platelets 452 (*)    All other components within normal limits  URINALYSIS, ROUTINE W REFLEX MICROSCOPIC - Abnormal; Notable for the following:    Glucose, UA 50 (*)    All other components within normal limits  COMPREHENSIVE METABOLIC PANEL - Abnormal; Notable for the following:  Glucose, Bld 197 (*)    BUN 21 (*)    Total Protein 8.3 (*)    All other components within normal limits  I-STAT CG4 LACTIC ACID, ED    EKG  EKG Interpretation None       Radiology No results found.  Procedures Procedures (including critical care time)  Medications Ordered in ED Medications  sodium chloride 0.9 % bolus 1,000 mL (0 mLs Intravenous Stopped 06/30/16 2335)  ketorolac (TORADOL) 30 MG/ML injection 30 mg (30 mg Intravenous Given 06/30/16 2059)  fentaNYL (SUBLIMAZE) injection 100 mcg (100 mcg Intravenous Given 06/30/16 2101)     Initial Impression / Assessment and Plan / ED Course  I have reviewed the triage vital signs and the nursing notes.  Pertinent labs & imaging results that were available during my care of the patient were reviewed by me and considered in my medical decision making (see chart for details).     Patient be treated for insulin-like illness.  Told to return here as needed.  Patient agrees the plan and all questions were answered.  Did advise her to follow-up with her primary care Dr. told to increase her fluid intake and rest as much as  possible  Final Clinical Impressions(s) / ED Diagnoses   Final diagnoses:  Upper respiratory tract infection, unspecified type    New Prescriptions Discharge Medication List as of 06/30/2016 11:00 PM    START taking these medications   Details  acetaminophen-codeine 120-12 MG/5ML solution Take 10 mLs by mouth every 4 (four) hours as needed for moderate pain., Starting Wed 06/30/2016, Print    Guaifenesin 1200 MG TB12 Take 1 tablet (1,200 mg total) by mouth 2 (two) times daily., Starting Wed 06/30/2016, Print         [place], PA-C 07/07/16 [id number]    Leo Grosser, MD 07/07/16 1013

## 2016-08-03 ENCOUNTER — Other Ambulatory Visit: Payer: Self-pay | Admitting: Infectious Diseases

## 2016-08-03 DIAGNOSIS — B20 Human immunodeficiency virus [HIV] disease: Secondary | ICD-10-CM

## 2016-08-11 ENCOUNTER — Ambulatory Visit (INDEPENDENT_AMBULATORY_CARE_PROVIDER_SITE_OTHER): Payer: Medicare Other | Admitting: Adult Health

## 2016-08-11 ENCOUNTER — Encounter: Payer: Self-pay | Admitting: Adult Health

## 2016-08-11 VITALS — BP 142/82 | HR 106 | Temp 97.7°F | Ht 67.0 in | Wt 237.0 lb

## 2016-08-11 DIAGNOSIS — R3915 Urgency of urination: Secondary | ICD-10-CM | POA: Diagnosis not present

## 2016-08-11 DIAGNOSIS — Z794 Long term (current) use of insulin: Secondary | ICD-10-CM

## 2016-08-11 DIAGNOSIS — R1032 Left lower quadrant pain: Secondary | ICD-10-CM

## 2016-08-11 DIAGNOSIS — Z7689 Persons encountering health services in other specified circumstances: Secondary | ICD-10-CM

## 2016-08-11 DIAGNOSIS — E1165 Type 2 diabetes mellitus with hyperglycemia: Secondary | ICD-10-CM

## 2016-08-11 DIAGNOSIS — IMO0002 Reserved for concepts with insufficient information to code with codable children: Secondary | ICD-10-CM

## 2016-08-11 DIAGNOSIS — K921 Melena: Secondary | ICD-10-CM | POA: Diagnosis not present

## 2016-08-11 LAB — BASIC METABOLIC PANEL
BUN: 10 mg/dL (ref 6–23)
CO2: 30 mEq/L (ref 19–32)
Calcium: 9.7 mg/dL (ref 8.4–10.5)
Chloride: 97 mEq/L (ref 96–112)
Creatinine, Ser: 1.07 mg/dL (ref 0.40–1.20)
GFR: 70.62 mL/min (ref >60.00)
Glucose, Bld: 394 mg/dL — ABNORMAL HIGH (ref 70–99)
POTASSIUM: 3.9 meq/L (ref 3.5–5.1)
SODIUM: 134 meq/L — AB (ref 135–145)

## 2016-08-11 LAB — POC URINALSYSI DIPSTICK (AUTOMATED)
BILIRUBIN UA: NEGATIVE
Ketones, UA: NEGATIVE
LEUKOCYTES UA: NEGATIVE
NITRITE UA: NEGATIVE
PH UA: 6
PROTEIN UA: NEGATIVE
RBC UA: NEGATIVE
Spec Grav, UA: 1.01
Urobilinogen, UA: 0.2

## 2016-08-11 LAB — CBC WITH DIFFERENTIAL/PLATELET
BASOS ABS: 0.1 10*3/uL (ref 0.0–0.1)
Basophils Relative: 0.9 % (ref 0.0–3.0)
EOS ABS: 0.5 10*3/uL (ref 0.0–0.7)
Eosinophils Relative: 7.4 % — ABNORMAL HIGH (ref 0.0–5.0)
Hemoglobin: 8.2 g/dL — ABNORMAL LOW (ref 12.0–15.0)
LYMPHS ABS: 2 10*3/uL (ref 0.7–4.0)
LYMPHS PCT: 30.3 % (ref 12.0–46.0)
MCHC: 30.8 g/dL (ref 30.0–36.0)
MCV: 64.2 fl — ABNORMAL LOW (ref 78.0–100.0)
Monocytes Absolute: 0.4 10*3/uL (ref 0.1–1.0)
Monocytes Relative: 6.5 % (ref 3.0–12.0)
NEUTROS ABS: 3.7 10*3/uL (ref 1.4–7.7)
Neutrophils Relative %: 54.9 % (ref 43.0–77.0)
PLATELETS: 495 10*3/uL — AB (ref 150.0–400.0)
RBC: 4.17 Mil/uL (ref 3.87–5.11)
RDW: 18.7 % — ABNORMAL HIGH (ref 11.5–15.5)
WBC: 6.7 10*3/uL (ref 4.0–10.5)

## 2016-08-11 LAB — HEMOGLOBIN A1C: Hgb A1c MFr Bld: 10.7 % — ABNORMAL HIGH (ref 4.6–6.5)

## 2016-08-11 NOTE — Progress Notes (Signed)
Patient presents to clinic today to establish care. 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   Acute Concerns: Establish Care   Chronic Issues: HIV - Takes Genyoya daily. Is followed by ID every 6 months.   Type 2 DM - she takes Metformin 531m XR, Amaryl 2 mg, and Toujeo 34 units daily. She feels as though her blood sugars are well controlled. She does not check her blood sugars at home because she does not have a meter  Rectal Bleeding - she reports on again off again rectal bleeding over last multiple years. She reports being seen by GI and having colonoscopies in the past which resulted in "nothing being found except for hemorrhoids". Reports that over the last 2 months she had both bright red blood and dark red blood noted in her stool when she wipes after using the bathroom. She denies seeing any clots. She feels as though she is becoming more fatigued and weak due to chronic blood loss.  Her bowel movements are not hard   Health Maintenance: Dental -- Routine - Dr. GCarlota RaspberryVision -- Routine  Immunizations -- UTD  Colonoscopy -- 2012 for rectal bleeding - hemorrhoids  Mammogram --2017  PAP -- 2017  Diet: She reports "I eat fairly well, I try and follow a diabetic diet". Exercise: She does not exercise  She is followed by:  - GYN - ID    Past Medical History:  Diagnosis Date  . Arthritis    knees  . Chronic folliculitis   . Diabetes mellitus   . Hemorrhoids   . [place] disease [place]   . Hypertension   . Iron deficiency anemia due to chronic blood loss   . Irregular menstrual cycle   . Meningitis [place] to cryptococcus ([place]   . Migraines   . Obesity   . Pneumothorax, traumatic      Past Surgical History:  Procedure Laterality Date  . CHEST TUBE INSERTION    . HYSTEROSCOPY W/D&C  Removal of Endometrial Polyp   Dr. sRaphael Gibney2011  . LAPAROSCOPIC LYSIS OF ADHESIONS  01/10/2014   Procedure: LAPAROSCOPIC LYSIS OF ADHESIONS;  Surgeon: AEna Dawley MD;  [facility];  Service: Gynecology;;  . LAPAROSCOPY N/A 01/10/2014   Procedure: LAPAROSCOPY OPERATIVE;  Surgeon: AEna Dawley MD;  [facility];  Service: Gynecology;  Laterality: N/A;  . TUBAL LIGATION    . UMBILICAL HERNIA REPAIR  01/10/2014   Procedure: HERNIA REPAIR UMBILICAL ADULT;  Surgeon: AEna Dawley MD;  [facility];  Service: Gynecology;;    Current Outpatient Prescriptions on File Prior to Visit  Medication Sig Dispense Refill  . acetaminophen-codeine 120-12 MG/5ML solution Take 10 mLs by mouth every 4 (four) hours as needed for moderate pain. 120 mL 0  . GENVOYA 150-150-200-10 MG TABS tablet take 1 tablet by mouth once daily WITH BREAKFAST 90 tablet 3  . glimepiride (AMARYL) 2 MG tablet Take 2 mg by mouth daily.  0  . Insulin Glargine (TOUJEO SOLOSTAR [place]) Inject 34 Units into the skin every morning.    .Marland Kitchenlisinopril-hydrochlorothiazide (PRINZIDE,ZESTORETIC) 20-25 MG tablet Take 1 tablet by mouth daily.    . metFORMIN (GLUCOPHAGE-XR) 500 MG 24 hr tablet Take 500 mg by mouth 2 (two) times daily.   0  .  oxyCODONE-acetaminophen (PERCOCET) 10-325 MG tablet Take 1 tablet by mouth daily.    . promethazine (PHENERGAN) 25 MG tablet Take 25 mg by mouth every 6 (six) hours as needed for nausea/vomiting.    Marland Kitchen zolpidem (AMBIEN) 10 MG tablet Take 10 mg by mouth at bedtime as needed for sleep.     No current facility-administered medications on file prior to visit.     Allergies  Allergen Reactions  . Cortisone Itching    REACTION: "nerves"  . Hydrocodone-Acetaminophen Itching    REACTION: "nerves"  . Morphine Itching    REACTION: "nerves"    Family History  Problem Relation Age of Onset  .  Diabetes Mother   . Heart failure Mother   . Heart failure Father   . Diabetes Father   . Heart failure Brother   . Diabetes Brother   . Colon cancer      neg hx.  . Diabetes Maternal Grandmother   . Alzheimer's disease Paternal Grandmother     Social History   Social History  . Marital status: Single    Spouse name: N/A  . Number of children: 4  . Years of education: N/A   Occupational History  . [employer]    Social History Main Topics  . Smoking status: Never Smoker  . Smokeless tobacco: Never Used  . Alcohol use No  . Drug use: No  . Sexual activity: No     Comment: refuses condoms/ BTL    Other Topics Concern  . 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  HENT: Negative.   Eyes: Negative.   Respiratory: Negative.   Cardiovascular: Negative.   Gastrointestinal: Positive for abdominal pain and blood in stool. Negative for constipation, diarrhea, nausea and vomiting.  Genitourinary: Negative.   Musculoskeletal: Negative.   Skin: Negative.   Neurological: Negative.   Endo/Heme/Allergies: Negative.   Psychiatric/Behavioral: Negative.   All other systems reviewed and are negative.   BP (!) 142/82 (BP Location: Left Arm, Patient Position: Sitting, Cuff Size: Normal)   Pulse (!) 106   Temp 97.7 F (36.5 C)   Ht _0  (1.702 m)   Wt 237 lb (107.5 kg)   BMI 37.12 kg/m   Physical Exam  Constitutional: She is oriented to person, place, and time and well-developed, well-nourished, and in no distress. No distress.  Neck: Normal range of motion. Neck supple. No thyromegaly present.  Cardiovascular: Normal rate, regular rhythm, normal heart sounds and intact distal pulses.  Exam reveals no gallop and no friction rub.     No murmur heard. Pulmonary/Chest: Effort normal and breath sounds normal. No respiratory distress. She has no wheezes. She has no rales. She exhibits no tenderness.  Abdominal: Soft. Normal appearance and bowel sounds are normal. She exhibits no distension and no mass. There is tenderness in the left lower quadrant. There is no rigidity, no rebound, no guarding, no tenderness at McBurney's point and negative Murphy's sign.  Genitourinary: Rectal exam shows external hemorrhoid, internal hemorrhoid and guaiac positive stool.  Lymphadenopathy:    She has no cervical adenopathy.  Neurological: She is alert and oriented to person, place, and time. She has normal  reflexes. She displays normal reflexes. No cranial nerve deficit. She exhibits normal muscle tone. Gait normal. Coordination normal. GCS score is 15.  Skin: Skin is warm and dry. No rash noted. She is not diaphoretic. No erythema. No pallor.  Psychiatric: Mood, memory, affect and judgment normal.  Nursing note and vitals reviewed.   Recent Results (from the past 2160 hour(s))  Urinalysis, Routine w reflex microscopic     Status: Abnormal   Collection Time: 06/30/16  5:53 PM  Result Value Ref Range   Color, Urine YELLOW YELLOW   APPearance CLEAR CLEAR   Specific Gravity, Urine 1.030 1.005 - 1.030   pH 5.0 5.0 - 8.0   Glucose, UA 50 (A) NEGATIVE mg/dL   Hgb urine dipstick NEGATIVE NEGATIVE   Bilirubin Urine NEGATIVE NEGATIVE   Ketones, ur NEGATIVE NEGATIVE mg/dL   Protein, ur NEGATIVE NEGATIVE mg/dL   Nitrite NEGATIVE NEGATIVE   Leukocytes, UA NEGATIVE NEGATIVE  CBC with Differential     Status: Abnormal   Collection Time: 06/30/16  6:07 PM  Result Value Ref Range   WBC 7.3 4.0 - 10.5 K/uL   RBC 4.53 3.87 - 5.11 MIL/uL   Hemoglobin 9.1 (L) 12.0 - 15.0 g/dL   HCT 30.5 (L) 36.0 - 46.0 %   MCV 67.3 (L) 78.0 - 100.0 fL   MCH 20.1 (L) 26.0 - 34.0 pg   MCHC 29.8 (L) 30.0 - 36.0 g/dL   RDW 17.6 (H) 11.5 - 15.5 %   Platelets 452 (H)  150 - 400 K/uL   Neutrophils Relative % 59 %   Lymphocytes Relative 26 %   Monocytes Relative 8 %   Eosinophils Relative 6 %   Basophils Relative 1 %   Neutro Abs 4.3 1.7 - 7.7 K/uL   Lymphs Abs 1.9 0.7 - 4.0 K/uL   Monocytes Absolute 0.6 0.1 - 1.0 K/uL   Eosinophils Absolute 0.4 0.0 - 0.7 K/uL   Basophils Absolute 0.1 0.0 - 0.1 K/uL   Smear Review MORPHOLOGY UNREMARKABLE   Comprehensive metabolic panel     Status: Abnormal   Collection Time: 06/30/16  6:07 PM  Result Value Ref Range   Sodium 136 135 - 145 mmol/L   Potassium 3.9 3.5 - 5.1 mmol/L   Chloride 101 101 - 111 mmol/L   CO2 29 22 - 32 mmol/L   Glucose, Bld 197 (H) 65 - 99 mg/dL   BUN 21 (H) 6 - 20 mg/dL   Creatinine, Ser 0.83 0.44 - 1.00 mg/dL   Calcium 8.9 8.9 - 10.3 mg/dL   Total Protein 8.3 (H) 6.5 - 8.1 g/dL   Albumin 4.3 3.5 - 5.0 g/dL   AST 17 15 - 41 U/L   ALT 14 14 - 54 U/L   Alkaline Phosphatase 100 38 - 126 U/L   Total Bilirubin 0.5 0.3 - 1.2 mg/dL   GFR calc non Af Amer >60 >60 mL/min   GFR calc Af Amer >60 >60 mL/min    Comment: (NOTE) The eGFR has been calculated using the CKD EPI equation. This calculation has not been validated in all clinical situations. eGFR's persistently <60 mL/min signify possible Chronic Kidney Disease.    Anion gap 6 5 - 15  I-Stat CG4 Lactic Acid, ED     Status: None   Collection Time: 06/30/16  6:18 PM  Result Value Ref Range   Lactic Acid, Venous 0.54 0.5 - 1.9 mmol/L  POCT Urinalysis Dipstick (Automated)     Status: None  Collection Time: 08/11/16  1:21 PM  Result Value Ref Range   Color, UA yellow    Clarity, UA clear    Glucose, UA 3+    Bilirubin, UA n    Ketones, UA n    Spec Grav, UA 1.010    Blood, UA n    pH, UA 6.0    Protein, UA n    Urobilinogen, UA 0.2    Nitrite, UA n    Leukocytes, UA Negative Negative  CBC with Differential/Platelet     Status: Abnormal   Collection Time: 08/11/16  2:03 PM  Result Value Ref Range   WBC 6.7 4.0 - 10.5 K/uL    RBC 4.17 3.87 - 5.11 Mil/uL   Hemoglobin 8.2 Repeated and verified X2. (L) 12.0 - 15.0 g/dL   HCT 26.8 Repeated and verified X2. (L) 36.0 - 46.0 %   MCV 64.2 (L) 78.0 - 100.0 fl   MCHC 30.8 30.0 - 36.0 g/dL   RDW 18.7 (H) 11.5 - 15.5 %   Platelets 495.0 (H) 150.0 - 400.0 K/uL   Neutrophils Relative % 54.9 43.0 - 77.0 %   Lymphocytes Relative 30.3 12.0 - 46.0 %   Monocytes Relative 6.5 3.0 - 12.0 %   Eosinophils Relative 7.4 (H) 0.0 - 5.0 %   Basophils Relative 0.9 0.0 - 3.0 %   Neutro Abs 3.7 1.4 - 7.7 K/uL   Lymphs Abs 2.0 0.7 - 4.0 K/uL   Monocytes Absolute 0.4 0.1 - 1.0 K/uL   Eosinophils Absolute 0.5 0.0 - 0.7 K/uL   Basophils Absolute 0.1 0.0 - 0.1 K/uL  Hemoglobin A1c     Status: Abnormal   Collection Time: 08/11/16  2:03 PM  Result Value Ref Range   Hgb A1c MFr Bld 10.7 (H) 4.6 - 6.5 %    Comment: Glycemic Control Guidelines for People with Diabetes:Non Diabetic:  <6%Goal of Therapy: <7%Additional Action Suggested:  >4%   Basic metabolic panel     Status: Abnormal   Collection Time: 08/11/16  2:03 PM  Result Value Ref Range   Sodium 134 (L) 135 - 145 mEq/L   Potassium 3.9 3.5 - 5.1 mEq/L   Chloride 97 96 - 112 mEq/L   CO2 30 19 - 32 mEq/L   Glucose, Bld 394 (H) 70 - 99 mg/dL   BUN 10 6 - 23 mg/dL   Creatinine, Ser 1.07 0.40 - 1.20 mg/dL   Calcium 9.7 8.4 - 10.5 mg/dL   GFR 70.62 >60.00 mL/min    Assessment/Plan: 1. Encounter to establish care - Follow up for CPE - Encouraged diabetic diet and heart healthy diet  2. Left lower quadrant pain - possible diverticulitis  - POCT Urinalysis Dipstick (Automated) - Basic metabolic panel; Future - CBC with Differential/Platelet - CT Abdomen Pelvis W Contrast; Future - Basic metabolic panel  3. Blood in stool - Hemorrhoids vs diverticulitis vs malignancy? - CT Abdomen Pelvis W Contrast; Future - Will get CT and likely refer back to GI  4. Insulin dependent type 2 diabetes mellitus, uncontrolled ([place]) - Meter given    - Basic metabolic panel; Future - Hemoglobin A1c - Likely increase Metformin and Insulin dose    Dorothyann Peng, NP

## 2016-08-13 ENCOUNTER — Other Ambulatory Visit: Payer: Self-pay | Admitting: Adult Health

## 2016-08-13 ENCOUNTER — Ambulatory Visit (INDEPENDENT_AMBULATORY_CARE_PROVIDER_SITE_OTHER)
Admission: RE | Admit: 2016-08-13 | Discharge: 2016-08-13 | Disposition: A | Discharge status: Home / Self Care | Payer: Medicare Other | Source: Ambulatory Visit | Attending: Adult Health | Admitting: Adult Health

## 2016-08-13 ENCOUNTER — Telehealth: Payer: Self-pay | Admitting: Adult Health

## 2016-08-13 DIAGNOSIS — R1032 Left lower quadrant pain: Secondary | ICD-10-CM

## 2016-08-13 DIAGNOSIS — K921 Melena: Secondary | ICD-10-CM

## 2016-08-13 MED ORDER — IOPAMIDOL (ISOVUE-300) INJECTION 61%
100.0000 mL | Freq: Once | INTRAVENOUS | Status: AC | PRN
  Administered 2016-08-13: 100 mL via INTRAVENOUS

## 2016-08-13 NOTE — Telephone Encounter (Signed)
Attempted to call to show with her lab results as well as her CT results. Left voicemail to call back when she got the message

## 2016-08-16 ENCOUNTER — Telehealth: Payer: Self-pay | Admitting: Adult Health

## 2016-08-16 DIAGNOSIS — K625 Hemorrhage of anus and rectum: Secondary | ICD-10-CM

## 2016-08-16 NOTE — Telephone Encounter (Signed)
Patient came in to get the phone number to contact [facility] tomorrow. I gave her his card and advised her that we will send a message to have [facility] call her Tuesday

## 2016-08-17 MED ORDER — METFORMIN HCL ER (MOD) 1000 MG PO TB24: 1000.0000 mg | ORAL_TABLET | Freq: Two times a day (BID) | ORAL | 3 refills | Status: DC

## 2016-08-17 MED ORDER — GLUCOSE BLOOD VI STRP: ORAL_STRIP | 12 refills | Status: DC

## 2016-08-17 MED ORDER — ONETOUCH ULTRASOFT LANCETS MISC: 12 refills | Status: DC

## 2016-08-17 NOTE — Telephone Encounter (Signed)
See below

## 2016-08-17 NOTE — Telephone Encounter (Signed)
Updated Brenda [name] on her blood work and CT scan   Her Hemoglobin has dropped about 1 point in the last month from rectal bleeding. I am going to send her back to GI for further evaluation  Her diabetes is uncontrolled.  Lab Results  Component Value Date   HGBA1C 10.7 (H) 08/11/2016   I am going to increase her Metformin to 1000mg  XR daily.   She is to eat healthy and exercise   Will redraw in three months

## 2016-09-04 ENCOUNTER — Encounter (HOSPITAL_COMMUNITY): Payer: Self-pay

## 2016-09-04 ENCOUNTER — Emergency Department (HOSPITAL_COMMUNITY): Payer: Medicare Other

## 2016-09-04 ENCOUNTER — Emergency Department (HOSPITAL_COMMUNITY)
Admission: EM | Admit: 2016-09-04 | Discharge: 2016-09-04 | Disposition: A | Discharge status: Home / Self Care | Payer: Medicare Other | Attending: Emergency Medicine | Admitting: Emergency Medicine

## 2016-09-04 DIAGNOSIS — E119 Type 2 diabetes mellitus without complications: Secondary | ICD-10-CM | POA: Insufficient documentation

## 2016-09-04 DIAGNOSIS — Z79899 Other long term (current) drug therapy: Secondary | ICD-10-CM | POA: Diagnosis not present

## 2016-09-04 DIAGNOSIS — J0100 Acute maxillary sinusitis, unspecified: Secondary | ICD-10-CM | POA: Diagnosis not present

## 2016-09-04 DIAGNOSIS — R05 Cough: Secondary | ICD-10-CM

## 2016-09-04 DIAGNOSIS — Z7982 Long term (current) use of aspirin: Secondary | ICD-10-CM | POA: Diagnosis not present

## 2016-09-04 DIAGNOSIS — I10 Essential (primary) hypertension: Secondary | ICD-10-CM | POA: Diagnosis not present

## 2016-09-04 DIAGNOSIS — R52 Pain, unspecified: Secondary | ICD-10-CM | POA: Diagnosis present

## 2016-09-04 DIAGNOSIS — Z7984 Long term (current) use of oral hypoglycemic drugs: Secondary | ICD-10-CM | POA: Insufficient documentation

## 2016-09-04 DIAGNOSIS — R059 Cough, unspecified: Secondary | ICD-10-CM

## 2016-09-04 LAB — COMPREHENSIVE METABOLIC PANEL
ALK PHOS: 88 U/L (ref 38–126)
ALT: 11 U/L — AB (ref 14–54)
AST: 15 U/L (ref 15–41)
Albumin: 3.7 g/dL (ref 3.5–5.0)
Anion gap: 7 (ref 5–15)
BILIRUBIN TOTAL: 0.3 mg/dL (ref 0.3–1.2)
BUN: 9 mg/dL (ref 6–20)
CALCIUM: 8.7 mg/dL — AB (ref 8.9–10.3)
CHLORIDE: 101 mmol/L (ref 101–111)
CO2: 25 mmol/L (ref 22–32)
CREATININE: 0.86 mg/dL (ref 0.44–1.00)
GFR calc non Af Amer: >60 mL/min (ref >60)
Glucose, Bld: 265 mg/dL — ABNORMAL HIGH (ref 65–99)
Potassium: 3.7 mmol/L (ref 3.5–5.1)
Sodium: 133 mmol/L — ABNORMAL LOW (ref 135–145)
Total Protein: 8 g/dL (ref 6.5–8.1)

## 2016-09-04 LAB — CBC WITH DIFFERENTIAL/PLATELET
Basophils Absolute: 0 10*3/uL (ref 0.0–0.1)
Basophils Relative: 0 %
EOS PCT: 2 %
Eosinophils Absolute: 0.2 10*3/uL (ref 0.0–0.7)
HCT: 27 % — ABNORMAL LOW (ref 36.0–46.0)
Hemoglobin: 8 g/dL — ABNORMAL LOW (ref 12.0–15.0)
LYMPHS ABS: 1.3 10*3/uL (ref 0.7–4.0)
Lymphocytes Relative: 14 %
MCH: 20.2 pg — ABNORMAL LOW (ref 26.0–34.0)
MCHC: 29.6 g/dL — ABNORMAL LOW (ref 30.0–36.0)
MCV: 68.2 fL — ABNORMAL LOW (ref 78.0–100.0)
MONO ABS: 0.6 10*3/uL (ref 0.1–1.0)
Monocytes Relative: 6 %
Neutro Abs: 7.2 10*3/uL (ref 1.7–7.7)
Neutrophils Relative %: 78 %
PLATELETS: 405 10*3/uL — AB (ref 150–400)
RBC: 3.96 MIL/uL (ref 3.87–5.11)
RDW: 18.8 % — ABNORMAL HIGH (ref 11.5–15.5)
WBC: 9.3 10*3/uL (ref 4.0–10.5)

## 2016-09-04 LAB — URINALYSIS, ROUTINE W REFLEX MICROSCOPIC
Bacteria, UA: NONE SEEN
Bilirubin Urine: NEGATIVE
Hgb urine dipstick: NEGATIVE
KETONES UR: NEGATIVE mg/dL
Nitrite: NEGATIVE
PH: 6 (ref 5.0–8.0)
Protein, ur: NEGATIVE mg/dL
SPECIFIC GRAVITY, URINE: 1.025 (ref 1.005–1.030)

## 2016-09-04 LAB — CK: CK TOTAL: 123 U/L (ref 38–234)

## 2016-09-04 MED ORDER — AMOXICILLIN-POT CLAVULANATE 875-125 MG PO TABS: 1.0000 | ORAL_TABLET | Freq: Two times a day (BID) | ORAL | 0 refills | Status: DC

## 2016-09-04 MED ORDER — METHOCARBAMOL 500 MG PO TABS: 500.0000 mg | ORAL_TABLET | Freq: Two times a day (BID) | ORAL | 0 refills | Status: DC | PRN

## 2016-09-04 MED ORDER — METHOCARBAMOL 500 MG PO TABS
500.0000 mg | ORAL_TABLET | Freq: Once | ORAL | Status: AC
  Administered 2016-09-04: 500 mg via ORAL
  Filled 2016-09-04: qty 1

## 2016-09-04 MED ORDER — SODIUM CHLORIDE 0.9 % IV BOLUS (SEPSIS)
1000.0000 mL | Freq: Once | INTRAVENOUS | Status: AC
  Administered 2016-09-04: 1000 mL via INTRAVENOUS

## 2016-09-04 MED ORDER — KETOROLAC TROMETHAMINE 30 MG/ML IJ SOLN
30.0000 mg | Freq: Once | INTRAMUSCULAR | Status: AC
  Administered 2016-09-04: 30 mg via INTRAVENOUS
  Filled 2016-09-04: qty 1

## 2016-09-04 NOTE — ED Triage Notes (Signed)
She c/o generalized h/a plus boldy aches since yesterday. She also c/o mild, non-productive cough.

## 2016-09-04 NOTE — ED Notes (Signed)
Patient transported to X-ray 

## 2016-09-04 NOTE — Discharge Instructions (Signed)
It was my pleasure taking care of you today!   Please take all of your antibiotics until finished!   Robaxin as needed for muscle aches / spasms.   Increase hydration.   Please follow up with your primary care provider for recheck of symptoms - call Monday morning to schedule follow up appointment if you are not feeling better.   Please return to ER for new or worsening symptoms, any additional concerns.

## 2016-09-04 NOTE — ED Provider Notes (Signed)
[facility] DEPT Provider Note   CSN: [id number] Arrival date & time: 09/04/16  1110     History   Chief Complaint Chief Complaint  Patient presents with  . Generalized Body Aches    HPI Brenda [name] is a 46 y.o. female.  The history is provided by the patient and medical records. No language interpreter was used.    Brenda [name] is a 46 y.o. female  with a PMH of HIV on Genvoya, DM, HTN who presents to the Emergency Department complaining of constant generalized body aches which started yesterday. She notes associated cough productive of yellow/green sputum, nasal congestion, sinus pressure, nausea and headache which started this morning. No medications taken prior to arrival for symptoms. No alleviating or aggravating factors noted. Patient states her blood sugars have been running in the 400s, however they're typically in the 200s. She denies abdominal pain, shortness breath, chest pain, fevers, or chills.     Past Medical History:  Diagnosis Date  . Arthritis    knees  . Chronic folliculitis   . Diabetes mellitus   . Hemorrhoids   . HIV disease ([place])   . Hypertension   . Iron deficiency anemia due to chronic blood loss   . Irregular menstrual cycle   . 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 Migraines   . Obesity   . Pneumothorax, traumatic     Patient Active Problem List   Diagnosis Date Noted  . Syncope 08/14/2015  . Rash and nonspecific skin eruption 06/30/2011  . Obesity   . MENORRHAGIA 09/04/2008  . ANEMIA, IRON DEFICIENCY 01/02/2008  . HEMORRHOIDS, WITH BLEEDING 11/28/2007  . Insulin dependent type 2 diabetes mellitus, uncontrolled ([place]) 02/20/2007  . Human immunodeficiency virus (HIV) disease ([place]) 03/11/2006  . 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 Migraine headache 03/11/2006  . FOLLICULITIS 15/17/6160    Past Surgical History:  Procedure Laterality Date  . CHEST TUBE INSERTION    . HYSTEROSCOPY W/D&C  Removal of  Endometrial Polyp   Dr. Raphael Gibney 2011  . LAPAROSCOPIC LYSIS OF ADHESIONS  01/10/2014   Procedure: LAPAROSCOPIC LYSIS OF ADHESIONS;  Surgeon: Ena Dawley, MD;  Location: [facility];  Service: Gynecology;;  . LAPAROSCOPY N/A 01/10/2014   Procedure: LAPAROSCOPY OPERATIVE;  Surgeon: Ena Dawley, MD;  Location: [facility];  Service: Gynecology;  Laterality: N/A;  . TUBAL LIGATION    . UMBILICAL HERNIA REPAIR  01/10/2014   Procedure: HERNIA REPAIR UMBILICAL ADULT;  Surgeon: Ena Dawley, MD;  Location: [facility];  Service: Gynecology;;    OB History    Gravida Para Term Preterm AB Living   8 4 4   4 4    SAB TAB Ectopic Multiple Live Births   4               Home Medications    Prior to Admission medications   Medication Sig Start Date End Date Taking? Authorizing Provider  amitriptyline (ELAVIL) 25 MG tablet Take 25 mg by mouth at bedtime.  09/03/16  Yes Historical Provider, MD  aspirin EC 81 MG tablet Take 81 mg by mouth daily.   Yes Historical Provider, MD  furosemide (LASIX) 20 MG tablet Take 20 mg by mouth daily as needed for edema.    Yes Historical Provider, MD  GENVOYA 150-150-200-10 MG TABS tablet take 1 tablet by mouth once daily WITH BREAKFAST 08/03/16  Yes Campbell Riches, MD  glimepiride (AMARYL) 2 MG tablet Take 2 mg by mouth daily. 05/23/16  Yes Historical  Provider, MD  glucose blood test strip Use as instructed 08/17/16  Yes Dorothyann Peng, NP  Insulin Glargine (TOUJEO SOLOSTAR [place]) Inject 45 Units into the skin every morning.    Yes Historical Provider, MD  Lancets Glory Rosebush ULTRASOFT) lancets Use as instructed 08/17/16  Yes Dorothyann Peng, NP  lisinopril-hydrochlorothiazide (PRINZIDE,ZESTORETIC) 20-25 MG tablet Take 1 tablet by mouth daily. 04/07/16  Yes Historical Provider, MD  metFORMIN (GLUMETZA) 1000 MG (MOD) 24 hr tablet Take 1 tablet (1,000 mg total) by mouth 2 (two) times daily. 08/17/16 11/15/16 Yes Dorothyann Peng, NP  oxyCODONE-acetaminophen (PERCOCET) 10-325 MG tablet Take 1  tablet by mouth daily. 06/11/16  Yes Historical Provider, MD  promethazine (PHENERGAN) 25 MG tablet Take 25 mg by mouth every 6 (six) hours as needed for nausea/vomiting. 06/11/16  Yes Historical Provider, MD  VOLTAREN 1 % GEL Apply 2 g topically daily as needed (for knee pain).  08/03/16  Yes Historical Provider, MD  zolpidem (AMBIEN) 10 MG tablet Take 10 mg by mouth at bedtime as needed for sleep. 06/11/16  Yes Historical Provider, MD  acetaminophen-codeine 120-12 MG/5ML solution Take 10 mLs by mouth every 4 (four) hours as needed for moderate pain. Patient not taking: Reported on 09/04/2016 06/30/16   Dalia Heading, PA-C  amoxicillin-clavulanate (AUGMENTIN) 875-125 MG tablet Take 1 tablet by mouth every 12 (twelve) hours. 09/04/16   Ozella Almond Amsi Grimley, PA-C  methocarbamol (ROBAXIN) 500 MG tablet Take 1 tablet (500 mg total) by mouth 2 (two) times daily as needed for muscle spasms. 09/04/16   [place], PA-C    Family History Family History  Problem Relation Age of Onset  . Diabetes Mother   . Heart failure Mother   . Heart failure Father   . Diabetes Father   . Heart failure Brother   . Diabetes Brother   . Colon cancer      neg hx.  . Diabetes Maternal Grandmother   . Alzheimer's disease Paternal Grandmother     Social History Social History  Substance Use Topics  . Smoking status: Never Smoker  . Smokeless tobacco: Never Used  . Alcohol use No     Allergies   Cortisone; Hydrocodone-acetaminophen; and Morphine   Review of Systems Review of Systems  Constitutional: Negative for chills and fever.  HENT: Positive for congestion and sinus pressure.   Eyes: Negative for visual disturbance.  Respiratory: Positive for cough. Negative for shortness of breath and wheezing.   Cardiovascular: Negative for chest pain.  Gastrointestinal: Positive for nausea. Negative for abdominal pain, blood in stool, constipation, diarrhea and vomiting.  Genitourinary: Negative for dysuria.    Musculoskeletal: Positive for myalgias.  Skin: Negative for rash.  Neurological: Positive for headaches. Negative for dizziness, syncope and weakness.     Physical Exam Updated Vital Signs BP 119/71 (BP Location: Left Arm)   Pulse 87   Temp 99.5 F (37.5 C) (Oral)   Resp 16   SpO2 94%   Physical Exam  Constitutional: She is oriented to person, place, and time. She appears well-developed and well-nourished. No distress.  HENT:  Head: Normocephalic and atraumatic.  OP with erythema, no exudates or tonsillar hypertrophy. + nasal congestion with mucosal edema. Tenderness to palpation of maxillary sinuses, L>R.   Neck: Normal range of motion. Neck supple.  No meningeal signs / nuchal rigidity.   Cardiovascular: Normal rate, regular rhythm and normal heart sounds.   Pulmonary/Chest: Effort normal.  Lungs are clear to auscultation bilaterally - no w/r/r  Abdominal: Soft.  She exhibits no distension. There is no tenderness.  Musculoskeletal: Normal range of motion.  Neurological: She is alert and oriented to person, place, and time.  Skin: Skin is warm and dry. She is not diaphoretic.  Nursing note and vitals reviewed.   ED Treatments / Results  Labs (all labs ordered are listed, but only abnormal results are displayed) Labs Reviewed  URINALYSIS, ROUTINE W REFLEX MICROSCOPIC - Abnormal; Notable for the following:       Result Value   APPearance CLOUDY (*)    Glucose, UA >=500 (*)    Leukocytes, UA MODERATE (*)    Squamous Epithelial / LPF TOO NUMEROUS TO COUNT (*)    All other components within normal limits  CBC WITH DIFFERENTIAL/PLATELET - Abnormal; Notable for the following:    Hemoglobin 8.0 (*)    HCT 27.0 (*)    MCV 68.2 (*)    MCH 20.2 (*)    MCHC 29.6 (*)    RDW 18.8 (*)    Platelets 405 (*)    All other components within normal limits  COMPREHENSIVE METABOLIC PANEL - Abnormal; Notable for the following:    Sodium 133 (*)    Glucose, Bld 265 (*)    Calcium 8.7  (*)    ALT 11 (*)    All other components within normal limits  CK  POC URINE PREG, ED    EKG  EKG Interpretation None       Radiology Dg Chest 2 View  Result Date: 09/04/2016 CLINICAL DATA:  Body aches and productive cough. EXAM: CHEST  2 VIEW COMPARISON:  June 30, 2016 FINDINGS: Mild cardiomegaly. The hila and mediastinum are unchanged. No pulmonary nodules, masses, focal infiltrates, or overt edema. IMPRESSION: No active cardiopulmonary disease. Electronically Signed   By: Dorise Bullion III M.D   On: 09/04/2016 12:06    Procedures Procedures (including critical care time)  Medications Ordered in ED Medications  sodium chloride 0.9 % bolus 1,000 mL (0 mLs Intravenous Stopped 09/04/16 1257)  ketorolac (TORADOL) 30 MG/ML injection 30 mg (30 mg Intravenous Given 09/04/16 1304)  methocarbamol (ROBAXIN) tablet 500 mg (500 mg Oral Given 09/04/16 1301)     Initial Impression / Assessment and Plan / ED Course  I have reviewed the triage vital signs and the nursing notes.  Pertinent labs & imaging results that were available during my care of the patient were reviewed by me and considered in my medical decision making (see chart for details).    Zyrah Elynore Dolinski is a 46 y.o. female who presents to ED for generalized body aches, nasal congestion, headaches, sinus pressure and productive cough. On exam, patient is afebrile, hemodynamically stable with clear lung exam. She does have tenderness to maxillary sinuses, L>R. No nuchal rigidity / meningeal signs appreciated. Labs reviewed and notable for anemia which appears to be baseline. Glucose of 265 - normal CO2 and anion gap. Doubt DKA. CXR negative for acute abnormality. Symptomatic management including fluids, robaxin and toradol provided in ED. Will treat likely maxillary sinusitis with augmentin and strongly encouraged patient to follow up with PCP or ID for recheck if symptoms not improving by Monday. Reasons to return to the  ER were discussed and all questions answered.   Patient seen by and discussed with Dr. Lita Mains who agrees with treatment plan.   Final Clinical Impressions(s) / ED Diagnoses   Final diagnoses:  Cough  Acute non-recurrent maxillary sinusitis    New Prescriptions Discharge Medication List as of 09/04/2016  2:20 PM    START taking these medications   Details  amoxicillin-clavulanate (AUGMENTIN) 875-125 MG tablet Take 1 tablet by mouth every 12 (twelve) hours., Starting Sat 09/04/2016, Print    methocarbamol (ROBAXIN) 500 MG tablet Take 1 tablet (500 mg total) by mouth 2 (two) times daily as needed for muscle spasms., Starting Sat 09/04/2016, Print         [employer] Perl Folmar, PA-C 09/04/16 1524    Julianne Rice, MD 09/05/16 1113

## 2016-09-21 ENCOUNTER — Ambulatory Visit (INDEPENDENT_AMBULATORY_CARE_PROVIDER_SITE_OTHER): Payer: Medicare Other | Admitting: Internal Medicine

## 2016-09-21 ENCOUNTER — Other Ambulatory Visit: Payer: Self-pay | Admitting: Internal Medicine

## 2016-09-21 ENCOUNTER — Other Ambulatory Visit (HOSPITAL_COMMUNITY): Payer: Self-pay | Admitting: *Deleted

## 2016-09-21 ENCOUNTER — Encounter: Payer: Self-pay | Admitting: Internal Medicine

## 2016-09-21 VITALS — BP 150/84 | HR 88 | Ht 67.0 in | Wt 238.0 lb

## 2016-09-21 DIAGNOSIS — D500 Iron deficiency anemia secondary to blood loss (chronic): Secondary | ICD-10-CM | POA: Diagnosis not present

## 2016-09-21 DIAGNOSIS — K642 Third degree hemorrhoids: Secondary | ICD-10-CM

## 2016-09-21 DIAGNOSIS — R109 Unspecified abdominal pain: Secondary | ICD-10-CM

## 2016-09-21 DIAGNOSIS — R1032 Left lower quadrant pain: Secondary | ICD-10-CM | POA: Diagnosis not present

## 2016-09-21 DIAGNOSIS — G8929 Other chronic pain: Secondary | ICD-10-CM | POA: Diagnosis not present

## 2016-09-21 DIAGNOSIS — D508 Other iron deficiency anemias: Secondary | ICD-10-CM

## 2016-09-21 MED ORDER — SODIUM CHLORIDE 0.9 % IV SOLN: 510.0000 mg | INTRAVENOUS | Status: DC

## 2016-09-21 MED ORDER — FERUMOXYTOL INJECTION 510 MG/17 ML: 510.0000 mg | Freq: Once | INTRAVENOUS | 0 refills | Status: DC

## 2016-09-21 MED ORDER — FERUMOXYTOL INJECTION 510 MG/17 ML: 510.0000 mg | INTRAVENOUS | Status: DC

## 2016-09-21 NOTE — Progress Notes (Signed)
Brenda [name] 46 y.o. December 22, 1970 [id number]  Assessment & Plan:   Encounter Diagnoses  Name Primary?  . Iron deficiency anemia due to chronic blood loss - hemorhoids and menses Yes  . Prolapsed internal hemorrhoids, grade 3 - bleeding   . Chronic LLQ pain   . Chronic left flank pain     I banded her right anterior hemorrhoid today and we anticipate banding of the other columns. Should help control hemorrhoidal bleeding.  Feraheme injections will be scheduled due to the significant iron deficiency anemia in the intolerance of oral iron.  I think her left lower quadrant and flank symptoms or musculoskeletal, perhaps spinal in origin and not GI in origin. Could be neuropathic otherwise she does have diabetes. I've been present for a long time and a colonoscopy, a laparoscopy, and multiple CT scans have failed to show any dangerous pathology. She will discuss for her orthopedic physician tomorrow. Pending upon what they can off her, could consider something like an SSRI or other agents designed to help chronic pain such as a tricyclic agent perhaps.  I appreciate the opportunity to care for this patient. CC:   Subjective:   Chief Complaint: rectal bleeding, LLQ pain  HPI The patient was seen several years ago, more than 3 for bleeding hemorrhoids and referred to surgery but did not have those repaired. She has a chronic recurrent iron deficiency anemia due to rectal bleeding and menorrhagia it is thought. She still suffers from menorrhagia. Not as bad as in the past. She had a dilation and curettage several years ago which helped transiently. She also has a chronic left flank and left lower quadrant almost inguinal pain. Recent CT scanning was unrevealing on this, abdomen and pelvis with contrast. She's had other CT scans. We are both in will help these pains. She cannot tolerate muscle relaxers are normal back as she gets severe cramps with these.  There is mild improvement  in this left lower quadrant pain with defecation but in general is there constantly, it hurts to move in bed and in twist etc. There is no buttock or leg radiation.  Still has symptomatic hemorrhoids, they prolapse and she has to manually reduce them at times. Bowel habits are regular. I did a colonoscopy in 2012 showing hemorrhoids and diverticulosis. The symptoms really are the same.  She has taken oral iron but could only take it every other day due to significant constipation.  Allergies  Allergen Reactions  . Cortisone Itching    REACTION: "nerves"  . Hydrocodone-Acetaminophen Itching    REACTION: "nerves"  . Morphine Itching    REACTION: "nerves"   Current Meds  Medication Sig  . aspirin EC 81 MG tablet Take 81 mg by mouth daily.  . GENVOYA 150-150-200-10 MG TABS tablet take 1 tablet by mouth once daily WITH BREAKFAST  . glimepiride (AMARYL) 2 MG tablet Take 2 mg by mouth daily.  Marland Kitchen glucose blood test strip Use as instructed  . Insulin Glargine (TOUJEO SOLOSTAR [place]) Inject 45 Units into the skin every morning.   . Lancets (ONETOUCH ULTRASOFT) lancets Use as instructed  . lisinopril-hydrochlorothiazide (PRINZIDE,ZESTORETIC) 20-25 MG tablet Take 1 tablet by mouth daily.  . metFORMIN (GLUMETZA) 1000 MG (MOD) 24 hr tablet Take 1 tablet (1,000 mg total) by mouth 2 (two) times daily.  Marland Kitchen oxyCODONE-acetaminophen (PERCOCET) 10-325 MG tablet Take 1 tablet by mouth daily.  . promethazine (PHENERGAN) 25 MG tablet Take 25 mg by mouth every 6 (six) hours as needed  for nausea/vomiting.  Marland Kitchen VOLTAREN 1 % GEL Apply 2 g topically daily as needed (for knee pain).   Marland Kitchen zolpidem (AMBIEN) 10 MG tablet Take 10 mg by mouth at bedtime as needed for sleep.     Past Medical History:  Diagnosis Date  . Arthritis    knees  . Chronic folliculitis   . Diabetes mellitus   . Hemorrhoids   . HIV disease ([place])   . Hypertension   . Iron deficiency anemia due to chronic blood loss   . Irregular menstrual cycle    . 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 Migraines   . Obesity   . Pneumothorax, traumatic    Past Surgical History:  Procedure Laterality Date  . CHEST TUBE INSERTION    . HYSTEROSCOPY W/D&C  Removal of Endometrial Polyp   Dr. Raphael Gibney 2011  . LAPAROSCOPIC LYSIS OF ADHESIONS  01/10/2014   Procedure: LAPAROSCOPIC LYSIS OF ADHESIONS;  Surgeon: Ena Dawley, MD;  Location: [facility];  Service: Gynecology;;  . LAPAROSCOPY N/A 01/10/2014   Procedure: LAPAROSCOPY OPERATIVE;  Surgeon: Ena Dawley, MD;  Location: [facility];  Service: Gynecology;  Laterality: N/A;  . TUBAL LIGATION    . UMBILICAL HERNIA REPAIR  01/10/2014   Procedure: HERNIA REPAIR UMBILICAL ADULT;  Surgeon: Ena Dawley, MD;  Location: [facility];  Service: Gynecology;;   family history includes Alzheimer's disease in her paternal grandmother; Diabetes in her brother, father, maternal grandmother, and mother; Heart failure in her brother, father, and mother. Social History   Social History  . Marital status: Single    Spouse name: N/A  . Number of children: 4  . Years of education: N/A   Occupational History  . [employer]    Social History Main Topics  . Smoking status: Never Smoker  . Smokeless tobacco: Never Used  . Alcohol use No  . Drug use: No  . Sexual activity: No     Comment: refuses condoms/ BTL    Other Topics Concern  . 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 She had an acute lumbar strain and hip problem the other month treated with an injection in the hip with relief. She is due to see her orthopedist Dr. Sharlet Salina tomorrow. He is also fatigued. Chronically. All other review of systems negative. Or as per history of present illness.  Objective:   Physical Exam BP (!) 150/84   Pulse 88   Ht  5\' 7"  (1.702 m)   Wt 238 lb (108 kg)   BMI 37.28 kg/m  Obese no acute distress Eyes are anicteric Lungs are clear to auscultation bilaterally Heart Sounds S1-S2 no rubs murmurs or gallops Abdomen is soft, there is some deep left lower quadrant tenderness almost in the groin without mass. There is no rebound. Her left flank is tender as well. There is no change in this pain with hip flexion and rotation or extension.  Rectal exam with June McMurray CMA present shows a right anterior fleshy tag. Anoderm otherwise normal. There is normal resting tone. There is brown formed stool nontender digital exam. Anal canal is moderately long, nontender no fissure.  Anoscopy is performed with CMA present, and demonstrates  grade 2 internal hemorrhoids all positions on this exam very inflamed. It might of bleeding present.  She is alert ordered 3 and has an appropriate mood and affect Skin exam shows diffuse annular type hyperpigmented macular lesions throughout her body.  Data reviewed include prior surgery notes, gynecologic notes and laparoscopic lysis of adhesions up note 2015, prior colonoscopy recent CT scan abdomen and pelvis in March ED visits.  CBC Latest Ref Rng & Units 09/04/2016 08/11/2016 06/30/2016  WBC 4.0 - 10.5 K/uL 9.3 6.7 7.3  Hemoglobin 12.0 - 15.0 g/dL 8.0(L) 8.2 Repeated and verified X2.(L) 9.1(L)  Hematocrit 36.0 - 46.0 % 27.0(L) 26.8 Repeated and verified X2.(L) 30.5(L)  Platelets 150 - 400 K/uL 405(H) 495.0(H) 452(H)   Lab Results  Component Value Date   FERRITIN 12.0 01/02/2008     PROCEDURE NOTE: The patient presents with symptomatic grade 3  hemorrhoids, requesting rubber band ligation of his/her hemorrhoidal disease.  All risks, benefits and alternative forms of therapy were described and informed consent was obtained.   The anorectum was pre-medicated with 0.125% nitroglycerin 5% lidocaine topical The decision was made to band the RA internal hemorrhoid, and the Crowder was used to perform band ligation without complication.  Digital anorectal examination was then performed to assure proper positioning of the band, and to adjust the banded tissue as required.  The patient was discharged home without pain or other issues.  Dietary and behavioral recommendations were given and along with follow-up instructions.      The patient will return in about 2 weeks for  follow-up and possible additional banding as required. No complications were encountered and the patient tolerated the procedure well.

## 2016-09-21 NOTE — Patient Instructions (Addendum)
You have been scheduled for a Feraheme infusion at [facility] on 09/27/16 9:00.  Please go to admitting to register.  You will schedule your second infusion for 2 weeks later at this appointment.   We will see you for more bandings on : 10/07/16 at 10:15AM.          HEMORRHOID BANDING PROCEDURE    FOLLOW-UP CARE   1. The procedure you have had should have been relatively painless since the banding of the area involved does not have nerve endings and there is no pain sensation.  The rubber band cuts off the blood supply to the hemorrhoid and the band may fall off as soon as 48 hours after the banding (the band may occasionally be seen in the toilet bowl following a bowel movement). You may notice a temporary feeling of fullness in the rectum which should respond adequately to plain Tylenol or Motrin.  2. Following the banding, avoid strenuous exercise that evening and resume full activity the next day.  A sitz bath (soaking in a warm tub) or bidet is soothing, and can be useful for cleansing the area after bowel movements.     3. To avoid constipation, take two tablespoons of natural wheat bran, natural oat bran, flax, Benefiber or any over the counter fiber supplement and increase your water intake to 7-8 glasses daily.    4. Unless you have been prescribed anorectal medication, do not put anything inside your rectum for two weeks: No suppositories, enemas, fingers, etc.  5. Occasionally, you may have more bleeding than usual after the banding procedure.  This is often from the untreated hemorrhoids rather than the treated one.  Don't be concerned if there is a tablespoon or so of blood.  If there is more blood than this, lie flat with your bottom higher than your head and apply an ice pack to the area. If the bleeding does not stop within a half an hour or if you feel faint, call our office at [phone] or go to the emergency room.  6. Problems are not common; however, if there  is a substantial amount of bleeding, severe pain, chills, fever or difficulty passing urine (very rare) or other problems, you should call us at [phone] or report to the nearest emergency room.  7. Do not stay seated continuously for more than 2-3 hours for a day or two after the procedure.  Tighten your buttock muscles 10-15 times every two hours and take 10-15 deep breaths every 1-2 hours.  Do not spend more than a few minutes on the toilet if you cannot empty your bowel; instead re-visit the toilet at a later time.

## 2016-09-27 ENCOUNTER — Ambulatory Visit (HOSPITAL_COMMUNITY): Admission: RE | Admit: 2016-09-27 | Payer: Medicare Other | Source: Ambulatory Visit

## 2016-09-30 ENCOUNTER — Telehealth: Payer: Self-pay | Admitting: Internal Medicine

## 2016-10-01 NOTE — Telephone Encounter (Signed)
Patient has been rescheduled at [facility] short Stay for 10/07/16 11:00. Patient notified of  appt details

## 2016-10-07 ENCOUNTER — Encounter: Payer: Medicare Other | Admitting: Internal Medicine

## 2016-10-07 ENCOUNTER — Ambulatory Visit (HOSPITAL_COMMUNITY)
Admission: RE | Admit: 2016-10-07 | Discharge: 2016-10-07 | Disposition: A | Discharge status: Home / Self Care | Payer: Medicare Other | Source: Ambulatory Visit | Attending: Internal Medicine | Admitting: Internal Medicine

## 2016-10-07 DIAGNOSIS — D500 Iron deficiency anemia secondary to blood loss (chronic): Secondary | ICD-10-CM

## 2016-10-07 MED ORDER — SODIUM CHLORIDE 0.9 % IV SOLN
510.0000 mg | INTRAVENOUS | Status: DC
  Administered 2016-10-07: 510 mg via INTRAVENOUS
  Filled 2016-10-07: qty 17

## 2016-10-08 ENCOUNTER — Other Ambulatory Visit (HOSPITAL_COMMUNITY): Payer: Self-pay | Admitting: *Deleted

## 2016-10-11 ENCOUNTER — Ambulatory Visit (HOSPITAL_COMMUNITY): Payer: Medicare Other | Attending: Internal Medicine

## 2016-10-12 ENCOUNTER — Other Ambulatory Visit: Payer: Self-pay

## 2016-10-12 ENCOUNTER — Telehealth: Payer: Self-pay | Admitting: Adult Health

## 2016-10-12 MED ORDER — ONETOUCH ULTRASOFT LANCETS MISC: 12 refills | Status: DC

## 2016-10-12 NOTE — Telephone Encounter (Signed)
Rx has been refilled to preferred pharmacy.

## 2016-10-12 NOTE — Telephone Encounter (Signed)
Pt request refill  Lancets (ONETOUCH ULTRASOFT) lancets  Pt states she did not pick up from [facility] because  they were not the right ones. But  [facility] has the right ones .  Pt states Tommi Rumps gave her the machine.  [place], [place] - [address]   Please take [facility] off pharmacy list

## 2016-10-14 ENCOUNTER — Ambulatory Visit: Payer: Medicare Other | Admitting: Adult Health

## 2016-10-14 ENCOUNTER — Other Ambulatory Visit: Payer: Medicare Other

## 2016-10-14 DIAGNOSIS — B20 Human immunodeficiency virus [HIV] disease: Secondary | ICD-10-CM

## 2016-10-14 DIAGNOSIS — Z79899 Other long term (current) drug therapy: Secondary | ICD-10-CM

## 2016-10-14 DIAGNOSIS — Z113 Encounter for screening for infections with a predominantly sexual mode of transmission: Secondary | ICD-10-CM

## 2016-10-14 LAB — COMPREHENSIVE METABOLIC PANEL
ALT: 10 U/L (ref 6–29)
AST: 12 U/L (ref 10–35)
Albumin: 4.2 g/dL (ref 3.6–5.1)
Alkaline Phosphatase: 109 U/L (ref 33–115)
BILIRUBIN TOTAL: 0.2 mg/dL (ref 0.2–1.2)
BUN: 18 mg/dL (ref 7–25)
CO2: 28 mmol/L (ref 20–31)
CREATININE: 0.91 mg/dL (ref 0.50–1.10)
Calcium: 9.5 mg/dL (ref 8.6–10.2)
Chloride: 100 mmol/L (ref 98–110)
GLUCOSE: 177 mg/dL — AB (ref 65–99)
Potassium: 3.7 mmol/L (ref 3.5–5.3)
SODIUM: 137 mmol/L (ref 135–146)
Total Protein: 7.7 g/dL (ref 6.1–8.1)

## 2016-10-14 LAB — CBC
HCT: 33.3 % — ABNORMAL LOW (ref 35.0–45.0)
HEMOGLOBIN: 10.1 g/dL — AB (ref 11.7–15.5)
MCH: 21 pg — ABNORMAL LOW (ref 27.0–33.0)
MCHC: 30.3 g/dL — AB (ref 32.0–36.0)
MCV: 69.4 fL — AB (ref 80.0–100.0)
MPV: 9 fL (ref 7.5–12.5)
Platelets: 416 10*3/uL — ABNORMAL HIGH (ref 140–400)
RBC: 4.8 MIL/uL (ref 3.80–5.10)
RDW: 20.1 % — AB (ref 11.0–15.0)
WBC: 8.3 10*3/uL (ref 3.8–10.8)

## 2016-10-14 LAB — LIPID PANEL
Cholesterol: 207 mg/dL — ABNORMAL HIGH (ref <200)
HDL: 51 mg/dL (ref >50)
LDL CALC: 128 mg/dL — AB (ref <100)
Total CHOL/HDL Ratio: 4.1 Ratio (ref <5.0)
Triglycerides: 138 mg/dL (ref <150)
VLDL: 28 mg/dL (ref <30)

## 2016-10-15 LAB — T-HELPER CELL (CD4) - (RCID CLINIC ONLY)
CD4 T CELL HELPER: 29 % — AB (ref 33–55)
CD4 T Cell Abs: 670 /uL (ref 400–2700)

## 2016-10-15 LAB — RPR

## 2016-10-16 LAB — HIV-1 RNA QUANT-NO REFLEX-BLD
HIV 1 RNA Quant: <20 copies/mL
HIV-1 RNA QUANT, LOG: NOT DETECTED {Log_copies}/mL

## 2016-10-28 ENCOUNTER — Ambulatory Visit: Payer: Self-pay | Admitting: Infectious Diseases

## 2016-11-13 ENCOUNTER — Emergency Department (HOSPITAL_COMMUNITY)
Admission: EM | Admit: 2016-11-13 | Discharge: 2016-11-13 | Disposition: A | Discharge status: Home / Self Care | Payer: Medicare Other | Attending: Emergency Medicine | Admitting: Emergency Medicine

## 2016-11-13 ENCOUNTER — Encounter (HOSPITAL_COMMUNITY): Payer: Self-pay

## 2016-11-13 ENCOUNTER — Emergency Department (HOSPITAL_COMMUNITY): Payer: Medicare Other

## 2016-11-13 DIAGNOSIS — M5431 Sciatica, right side: Secondary | ICD-10-CM | POA: Diagnosis not present

## 2016-11-13 DIAGNOSIS — M79604 Pain in right leg: Secondary | ICD-10-CM | POA: Diagnosis not present

## 2016-11-13 DIAGNOSIS — E119 Type 2 diabetes mellitus without complications: Secondary | ICD-10-CM | POA: Insufficient documentation

## 2016-11-13 DIAGNOSIS — R51 Headache: Secondary | ICD-10-CM | POA: Diagnosis not present

## 2016-11-13 DIAGNOSIS — Z7982 Long term (current) use of aspirin: Secondary | ICD-10-CM | POA: Diagnosis not present

## 2016-11-13 DIAGNOSIS — R519 Headache, unspecified: Secondary | ICD-10-CM

## 2016-11-13 DIAGNOSIS — Z79899 Other long term (current) drug therapy: Secondary | ICD-10-CM | POA: Insufficient documentation

## 2016-11-13 DIAGNOSIS — Z794 Long term (current) use of insulin: Secondary | ICD-10-CM | POA: Insufficient documentation

## 2016-11-13 DIAGNOSIS — Z7984 Long term (current) use of oral hypoglycemic drugs: Secondary | ICD-10-CM | POA: Insufficient documentation

## 2016-11-13 DIAGNOSIS — M545 Low back pain: Secondary | ICD-10-CM | POA: Diagnosis present

## 2016-11-13 DIAGNOSIS — I10 Essential (primary) hypertension: Secondary | ICD-10-CM | POA: Insufficient documentation

## 2016-11-13 LAB — COMPREHENSIVE METABOLIC PANEL
ALBUMIN: 3.9 g/dL (ref 3.5–5.0)
ALK PHOS: 85 U/L (ref 38–126)
ALT: 14 U/L (ref 14–54)
ANION GAP: 7 (ref 5–15)
AST: 18 U/L (ref 15–41)
BILIRUBIN TOTAL: 0.4 mg/dL (ref 0.3–1.2)
BUN: 19 mg/dL (ref 6–20)
CALCIUM: 8.6 mg/dL — AB (ref 8.9–10.3)
CO2: 26 mmol/L (ref 22–32)
CREATININE: 1.27 mg/dL — AB (ref 0.44–1.00)
Chloride: 106 mmol/L (ref 101–111)
GFR calc non Af Amer: 49 mL/min — ABNORMAL LOW (ref >60)
GFR, EST AFRICAN AMERICAN: 57 mL/min — AB (ref >60)
GLUCOSE: 225 mg/dL — AB (ref 65–99)
Potassium: 4.1 mmol/L (ref 3.5–5.1)
SODIUM: 139 mmol/L (ref 135–145)
TOTAL PROTEIN: 7.7 g/dL (ref 6.5–8.1)

## 2016-11-13 LAB — CBC
HEMATOCRIT: 32.3 % — AB (ref 36.0–46.0)
HEMOGLOBIN: 10 g/dL — AB (ref 12.0–15.0)
MCH: 22.9 pg — ABNORMAL LOW (ref 26.0–34.0)
MCHC: 31 g/dL (ref 30.0–36.0)
MCV: 73.9 fL — ABNORMAL LOW (ref 78.0–100.0)
Platelets: 303 10*3/uL (ref 150–400)
RBC: 4.37 MIL/uL (ref 3.87–5.11)
RDW: 22.4 % — AB (ref 11.5–15.5)
WBC: 8.2 10*3/uL (ref 4.0–10.5)

## 2016-11-13 MED ORDER — PREDNISONE 20 MG PO TABS: 40.0000 mg | ORAL_TABLET | Freq: Every day | ORAL | 0 refills | Status: AC

## 2016-11-13 MED ORDER — KETOROLAC TROMETHAMINE 30 MG/ML IJ SOLN
30.0000 mg | Freq: Once | INTRAMUSCULAR | Status: AC
  Administered 2016-11-13: 30 mg via INTRAVENOUS
  Filled 2016-11-13: qty 1

## 2016-11-13 MED ORDER — DIAZEPAM 5 MG PO TABS
5.0000 mg | ORAL_TABLET | Freq: Once | ORAL | Status: AC
  Administered 2016-11-13: 5 mg via ORAL
  Filled 2016-11-13: qty 1

## 2016-11-13 MED ORDER — ONDANSETRON HCL 4 MG/2ML IJ SOLN
4.0000 mg | Freq: Four times a day (QID) | INTRAMUSCULAR | Status: DC | PRN
  Administered 2016-11-13: 4 mg via INTRAVENOUS
  Filled 2016-11-13: qty 2

## 2016-11-13 MED ORDER — METHOCARBAMOL 500 MG PO TABS: 500.0000 mg | ORAL_TABLET | Freq: Three times a day (TID) | ORAL | 0 refills | Status: DC | PRN

## 2016-11-13 MED ORDER — PREDNISONE 20 MG PO TABS
60.0000 mg | ORAL_TABLET | Freq: Once | ORAL | Status: AC
  Administered 2016-11-13: 60 mg via ORAL
  Filled 2016-11-13: qty 3

## 2016-11-13 MED ORDER — HYDROMORPHONE HCL 1 MG/ML IJ SOLN
1.0000 mg | Freq: Once | INTRAMUSCULAR | Status: AC
  Administered 2016-11-13: 1 mg via INTRAVENOUS
  Filled 2016-11-13: qty 1

## 2016-11-13 MED ORDER — TRAMADOL HCL 50 MG PO TABS: 50.0000 mg | ORAL_TABLET | Freq: Four times a day (QID) | ORAL | 0 refills | Status: DC | PRN

## 2016-11-13 MED ORDER — ACETAMINOPHEN 325 MG PO TABS: 650.0000 mg | ORAL_TABLET | Freq: Once | ORAL | Status: DC

## 2016-11-13 MED ORDER — SODIUM CHLORIDE 0.9 % IV BOLUS (SEPSIS)
1000.0000 mL | Freq: Once | INTRAVENOUS | Status: AC
  Administered 2016-11-13: 1000 mL via INTRAVENOUS

## 2016-11-13 NOTE — ED Triage Notes (Signed)
Per EMS, pt from home.  Pt c/o rt back pain radiating down leg.  New onset.  Worse with movement.  Pt ambulated to door with assistance.  Pt also c/o headache.  Vitals:  120/70, hr 81, resp 16, 97% with oxygen at 2l per [place].  cbg 333.  Pt has high cbg chronic.  Has not had meds today.

## 2016-11-13 NOTE — ED Notes (Signed)
Bed: CX50 Expected date: 11/13/16 Expected time:  Means of arrival:  Comments: 46 yo Leg pain, back pain

## 2016-11-13 NOTE — ED Provider Notes (Signed)
[facility] DEPT Provider Note   CSN: [id number] Arrival date & time: 11/13/16  0955     History   Chief Complaint Chief Complaint  Patient presents with  . Leg Pain  . Headache    HPI Brenda [name] is a 46 y.o. female.  HPI Patient presents to the emergency department with complaints of right low back pain with radiation down her right leg to her right buttock region.  She states this is worse with movement.  She's never had sciatica before.  She denies weakness of her arms or legs.  She does report mild headache at this time.  Denies head trauma.  No nausea or vomiting.  She works as a Curator.  Pain is moderate to severe in severity.   Past Medical History:  Diagnosis Date  . Arthritis    knees  . Chronic folliculitis   . Diabetes mellitus   . Hemorrhoids   . HIV disease ([place])   . Hypertension   . Iron deficiency anemia due to chronic blood loss   . Irregular menstrual cycle   . 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 Migraines   . Obesity   . Pneumothorax, traumatic     Patient Active Problem List   Diagnosis Date Noted  . Iron deficiency anemia due to chronic blood loss - hemorhoids and menses 09/21/2016  . Prolapsed internal hemorrhoids, grade 3 - bleeding 09/21/2016  . Syncope 08/14/2015  . Rash and nonspecific skin eruption 06/30/2011  . Obesity   . MENORRHAGIA 09/04/2008  . Insulin dependent type 2 diabetes mellitus, uncontrolled ([place]) 02/20/2007  . Human immunodeficiency virus (HIV) disease ([place]) 03/11/2006  . 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 Migraine headache 03/11/2006  . FOLLICULITIS 23/53/6144    Past Surgical History:  Procedure Laterality Date  . CHEST TUBE INSERTION    . HEMORRHOID BANDING    . HYSTEROSCOPY W/D&C  Removal of Endometrial Polyp   Dr. Raphael Gibney 2011  . LAPAROSCOPIC LYSIS OF ADHESIONS  01/10/2014   Procedure: LAPAROSCOPIC LYSIS OF ADHESIONS;  Surgeon: Ena Dawley, MD;  Location: [facility];  Service:  Gynecology;;  . LAPAROSCOPY N/A 01/10/2014   Procedure: LAPAROSCOPY OPERATIVE;  Surgeon: Ena Dawley, MD;  Location: [facility];  Service: Gynecology;  Laterality: N/A;  . TUBAL LIGATION    . UMBILICAL HERNIA REPAIR  01/10/2014   Procedure: HERNIA REPAIR UMBILICAL ADULT;  Surgeon: Ena Dawley, MD;  Location: [facility];  Service: Gynecology;;    OB History    Gravida Para Term Preterm AB Living   8 4 4   4 4    SAB TAB Ectopic Multiple Live Births   4               Home Medications    Prior to Admission medications   Medication Sig Start Date End Date Taking? Authorizing Provider  aspirin EC 81 MG tablet Take 81 mg by mouth daily.   Yes [provider]  GENVOYA 150-150-200-10 MG TABS tablet take 1 tablet by mouth once daily WITH BREAKFAST 08/03/16  Yes Campbell Riches, MD  glipiZIDE (GLUCOTROL) 10 MG tablet Take 10 mg by mouth daily.   Yes [provider]  glucose blood test strip Use as instructed 08/17/16  Yes Nafziger, Tommi Rumps, NP  hydrOXYzine (ATARAX/VISTARIL) 25 MG tablet Take 25 mg by mouth at bedtime. 11/05/16  Yes [provider]  ibuprofen (ADVIL,MOTRIN) 800 MG tablet Take 800 mg by mouth daily as needed for pain. 10/08/16  Yes [provider]  Insulin Glargine (TOUJEO SOLOSTAR ) Inject 50 Units into the skin every morning.    Yes [provider]  Lancets Glory Rosebush ULTRASOFT) lancets Use as instructed 10/12/16  Yes Nafziger, Tommi Rumps, NP  lisinopril-hydrochlorothiazide (PRINZIDE,ZESTORETIC) 20-25 MG tablet Take 1 tablet by mouth daily. 04/07/16  Yes [provider]  LYRICA 75 MG capsule Take 75 mg by mouth daily. 11/05/16  Yes [provider]  metFORMIN (GLUMETZA) 1000 MG (MOD) 24 hr tablet Take 1 tablet (1,000 mg total) by mouth 2 (two) times daily. 08/17/16 11/15/16 Yes Nafziger, Tommi Rumps, NP  ondansetron (ZOFRAN-ODT) 8 MG disintegrating tablet Take 8 mg by mouth every 8 (eight) hours as needed for nausea/vomiting.   Yes [provider]  promethazine (PHENERGAN) 25 MG tablet Take 25 mg by mouth every 6 (six) hours as needed for nausea/vomiting. 06/11/16  Yes [provider]  traZODone (DESYREL) 100 MG tablet Take 100 mg by mouth at bedtime. 11/05/16  Yes [provider]  VOLTAREN 1 % GEL Apply 2 g topically daily as needed (for knee pain).  08/03/16  Yes [provider]  zolpidem (AMBIEN) 10 MG tablet Take 10 mg by mouth at bedtime as needed for sleep. 06/11/16  Yes [provider]  ferumoxytol (FERAHEME) 510 MG/17ML SOLN injection Inject 17 mLs (510 mg total) into the vein once. 09/21/16 09/21/16  Gatha Mayer, MD  methocarbamol (ROBAXIN) 500 MG tablet Take 1 tablet (500 mg total) by mouth every 8 (eight) hours as needed for muscle spasms. 11/13/16   Jola Schmidt, MD  predniSONE (DELTASONE) 20 MG tablet Take 2 tablets (40 mg total) by mouth daily. 11/13/16 11/17/16  Jola Schmidt, MD  traMADol (ULTRAM) 50 MG tablet Take 1 tablet (50 mg total) by mouth every 6 (six) hours as needed. 11/13/16   Jola Schmidt, MD    Family History Family History  Problem Relation Age of Onset  . Diabetes Mother   . Heart failure Mother   . Heart failure Father   . Diabetes Father   . Heart failure Brother   . Diabetes Brother   . Colon cancer Unknown        neg hx.  . Diabetes Maternal Grandmother   . Alzheimer's disease Paternal Grandmother     Social History Social History  Substance Use Topics  . Smoking status: Never Smoker  . Smokeless tobacco: Never Used  . Alcohol use No     Allergies   Cortisone; Hydrocodone-acetaminophen; and Morphine   Review of Systems Review of Systems  All other systems reviewed and are negative.    Physical Exam Updated Vital Signs BP 128/79 (BP Location: Left Wrist)   Pulse 80   Temp 98.1 F (36.7 C)   Resp 18   SpO2 97%   Physical Exam  Constitutional: She is oriented to person, place, and time. She appears well-developed and well-nourished.    HENT:  Head: Normocephalic.  Eyes: EOM are normal.  Neck: Normal range of motion.  Pulmonary/Chest: Effort normal.  Abdominal: She exhibits no distension.  Musculoskeletal: Normal range of motion.  Full range of motion bilateral hips, knees, ankles.  Normal PT and DP pulses bilaterally.  Mild tenderness in the right sciatic groove.  No thoracic or lumbar tenderness.  Neurological: She is alert and oriented to person, place, and time.  Skin: No rash noted.  Psychiatric: She has a normal mood and affect.  Nursing note and vitals reviewed.    ED Treatments / Results  Labs (all  labs ordered are listed, but only abnormal results are displayed) Labs Reviewed  CBC - Abnormal; Notable for the following:       Result Value   Hemoglobin 10.0 (*)    HCT 32.3 (*)    MCV 73.9 (*)    MCH 22.9 (*)    RDW 22.4 (*)    All other components within normal limits  COMPREHENSIVE METABOLIC PANEL - Abnormal; Notable for the following:    Glucose, Bld 225 (*)    Creatinine, Ser 1.27 (*)    Calcium 8.6 (*)    GFR calc non Af Amer 49 (*)    GFR calc Af Amer 57 (*)    All other components within normal limits    EKG  EKG Interpretation  Date/Time:  Saturday November 13 2016 13:01:01 EDT Ventricular Rate:  66 PR Interval:    QRS Duration: 99 QT Interval:  417 QTC Calculation: 437 R Axis:   71 Text Interpretation:  Sinus rhythm No significant change was found Confirmed by Jola Schmidt [phone]) on 11/13/2016 2:59:15 PM       Radiology Dg Chest 2 View  Result Date: 11/13/2016 CLINICAL DATA:  Back pain radiating to right leg EXAM: CHEST  2 VIEW COMPARISON:  09/04/2016 FINDINGS: Mild cardiomegaly. Normal vascularity. Lungs under aerated and grossly clear. No pneumothorax. No pleural effusion. IMPRESSION: Cardiomegaly without decompensation. Electronically Signed   By: Marybelle Killings M.D.   On: 11/13/2016 13:46    Procedures Procedures (including critical care time)  Medications Ordered in  ED Medications  ondansetron (ZOFRAN) injection 4 mg (4 mg Intravenous Given 11/13/16 1031)  HYDROmorphone (DILAUDID) injection 1 mg (1 mg Intravenous Given 11/13/16 1031)  diazepam (VALIUM) tablet 5 mg (5 mg Oral Given 11/13/16 1031)  ketorolac (TORADOL) 30 MG/ML injection 30 mg (30 mg Intravenous Given 11/13/16 1031)  sodium chloride 0.9 % bolus 1,000 mL (1,000 mLs Intravenous New Bag/Given 11/13/16 1031)  predniSONE (DELTASONE) tablet 60 mg (60 mg Oral Given 11/13/16 1316)     Initial Impression / Assessment and Plan / ED Course  I have reviewed the triage vital signs and the nursing notes.  Pertinent labs & imaging results that were available during my care of the patient were reviewed by me and considered in my medical decision making (see chart for details).     Patient with what appears to be sciatic of the right lower extremity.  Pain treated.  Home with muscle relaxants, steroids, short course of tramadol.  Primary care follow-up.  Doubt stroke.  No weakness in the lower extremities.  No trauma or injury to suggest need for x-ray.   Final Clinical Impressions(s) / ED Diagnoses   Final diagnoses:  Sciatica, right side  Nonintractable headache, unspecified chronicity pattern, unspecified headache type    New Prescriptions New Prescriptions   METHOCARBAMOL (ROBAXIN) 500 MG TABLET    Take 1 tablet (500 mg total) by mouth every 8 (eight) hours as needed for muscle spasms.   PREDNISONE (DELTASONE) 20 MG TABLET    Take 2 tablets (40 mg total) by mouth daily.   TRAMADOL (ULTRAM) 50 MG TABLET    Take 1 tablet (50 mg total) by mouth every 6 (six) hours as needed.     Jola Schmidt, MD 11/13/16 [phone]

## 2016-11-15 ENCOUNTER — Ambulatory Visit: Payer: Self-pay | Admitting: Infectious Diseases

## 2017-02-25 ENCOUNTER — Encounter: Payer: Self-pay | Admitting: Adult Health

## 2017-04-29 IMAGING — CT CT ABD-PELV W/ CM
2 of 5 series · 16 of 46 positions shown, 18 images · IV contrast (ISOVUE 300)
Comparison: CT scan of October 30, 2013.

CLINICAL DATA: Chronic rectal bleeding, left lower quadrant
abdominal pain for 2 months.

EXAM:
CT ABDOMEN AND PELVIS WITH CONTRAST
TECHNIQUE: Multidetector CT imaging of the abdomen and pelvis was performed
using the standard protocol following bolus administration of
intravenous contrast.
CONTRAST:  100mL OEKTHK-VPP IOPAMIDOL (OEKTHK-VPP) INJECTION 61%

[Series 2: abd/pel w · axial · 0.83mm/px · z∈[-486,-41]mm · 13 of 101 slices shown, 15 images]
[im 6/101  soft-tissue]
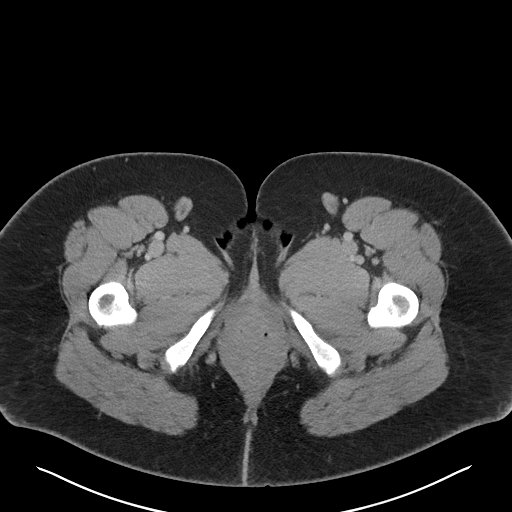
[im 6/101  bone]
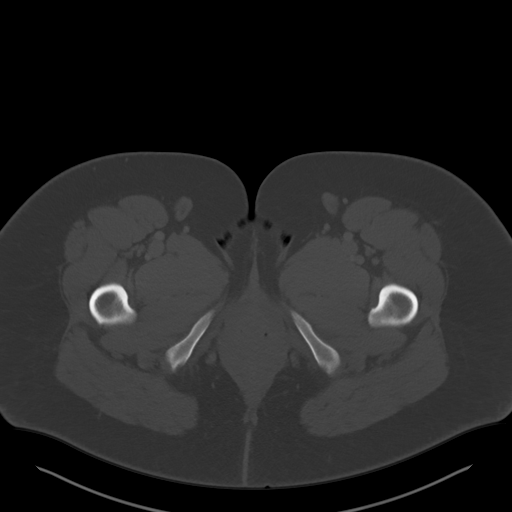
[im 16/101  soft-tissue]
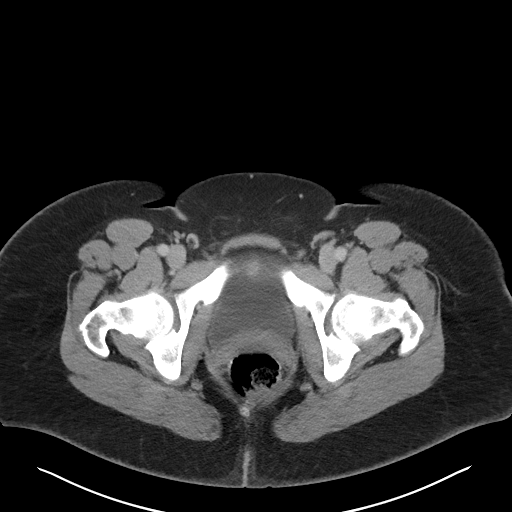
[im 22/101  soft-tissue]
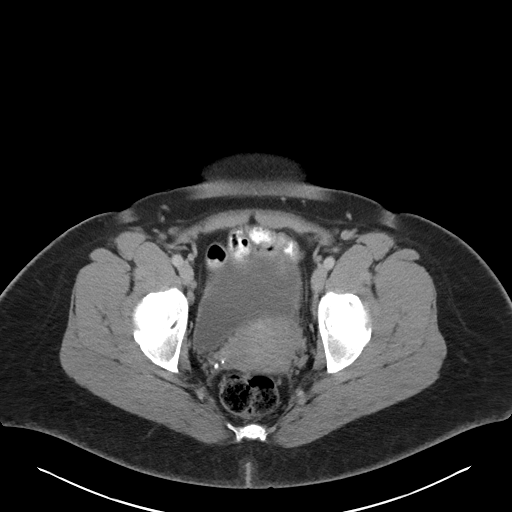
[im 27/101  soft-tissue]
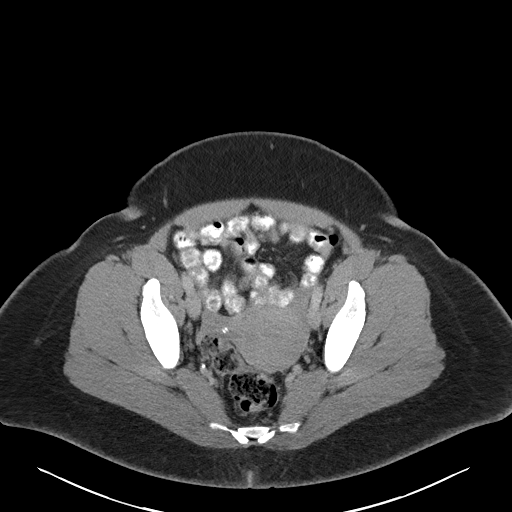
[im 37/101  soft-tissue]
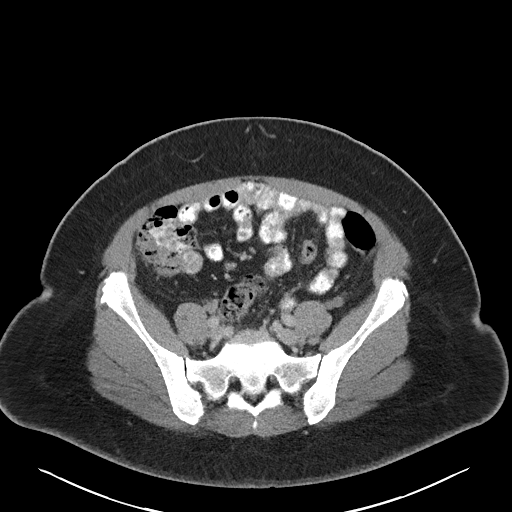
[im 43/101  soft-tissue]
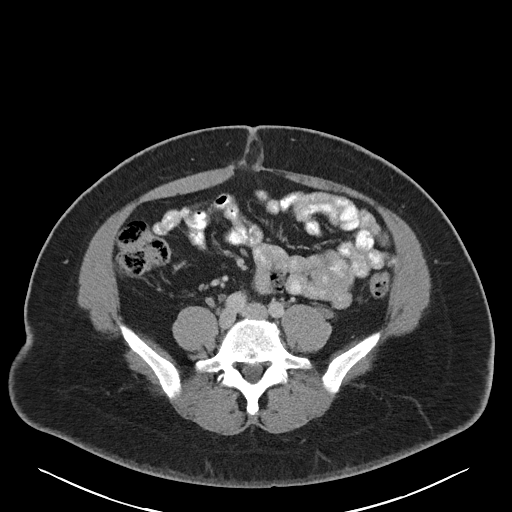
[im 53/101  soft-tissue]
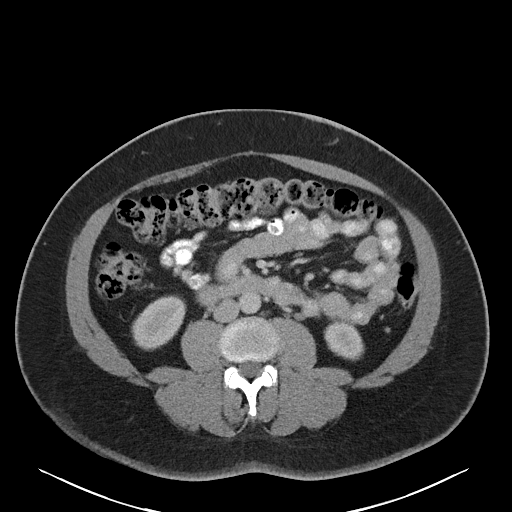
[im 58/101  soft-tissue]
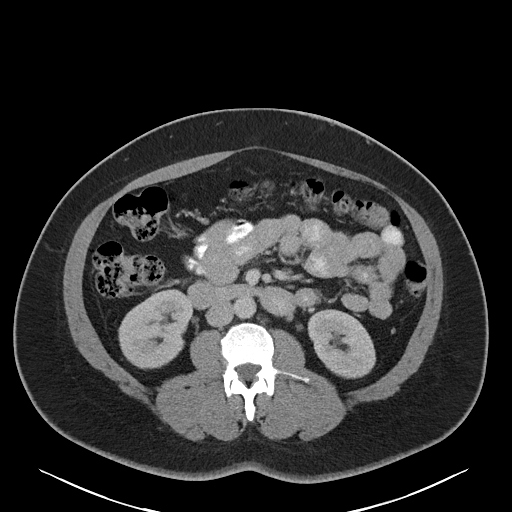
[im 64/101  soft-tissue]
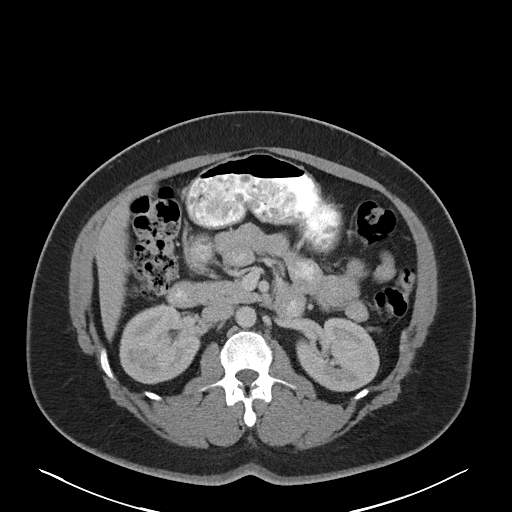
[im 64/101  bone]
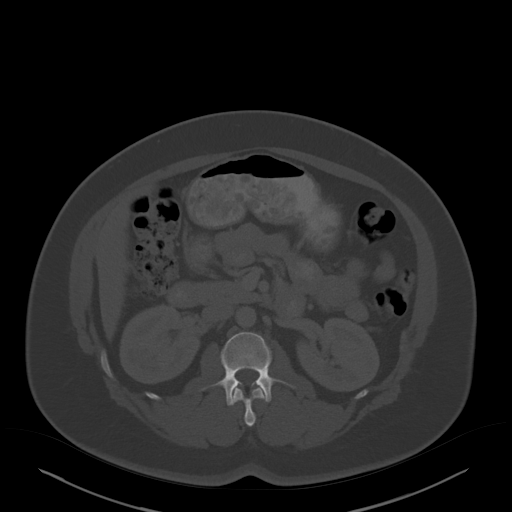
[im 74/101  soft-tissue]
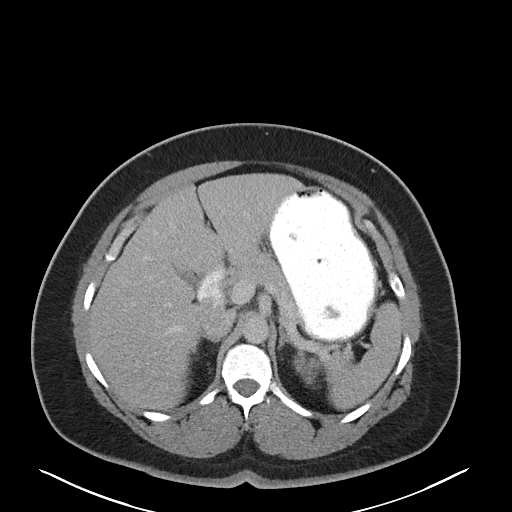
[im 79/101  soft-tissue]
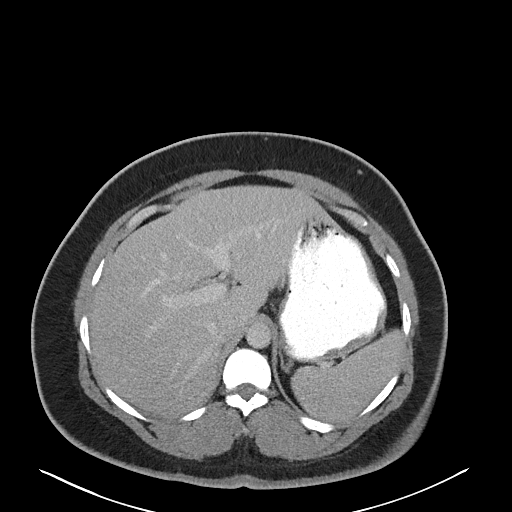
[im 85/101  soft-tissue]
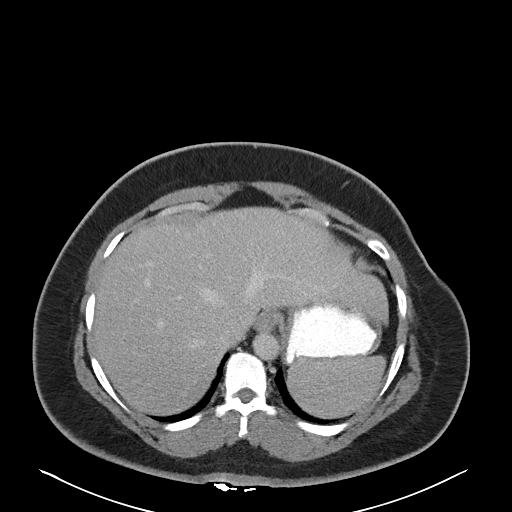
[im 95/101  soft-tissue]
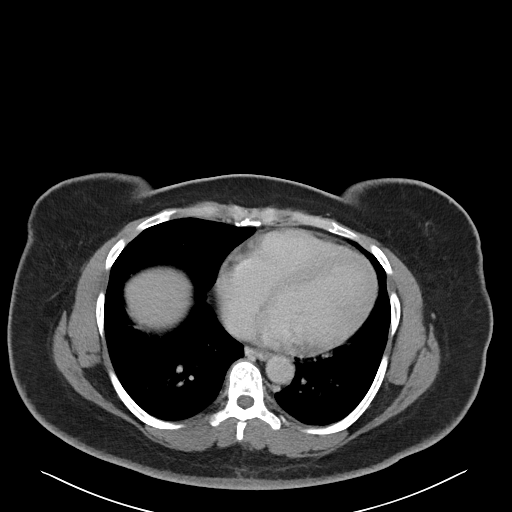

[Series 6: abd/pel w st · coronal · 0.67mm/px · 3 of 89 slices shown]
[im 30/89  soft-tissue]
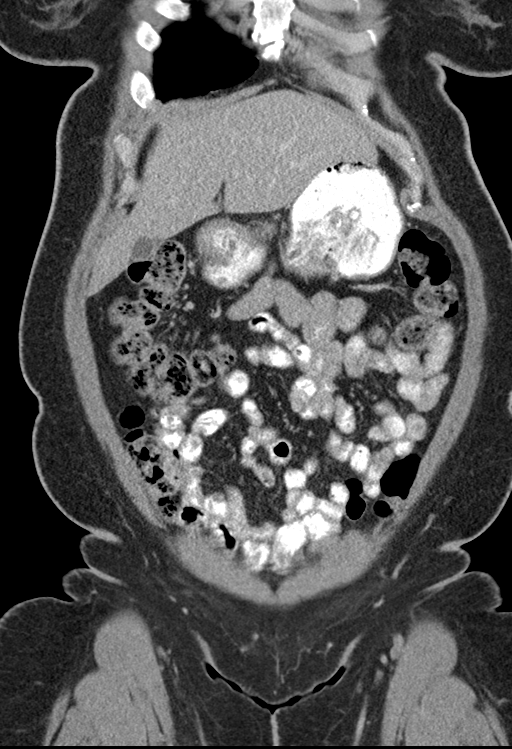
[im 40/89  soft-tissue]
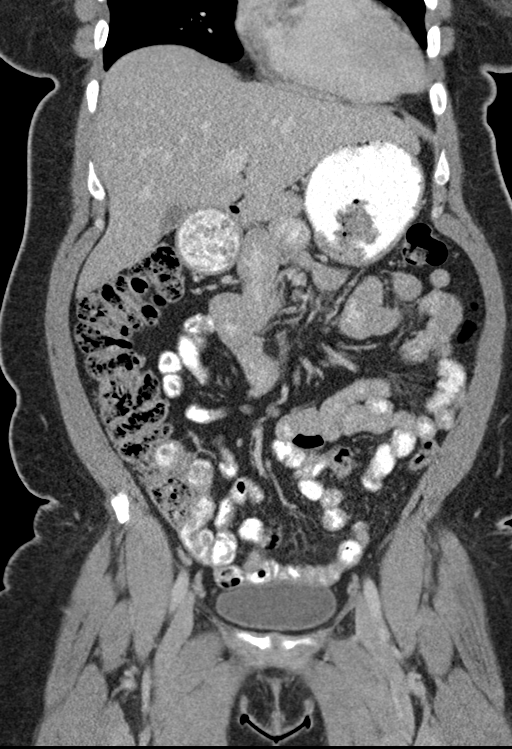
[im 49/89  soft-tissue]
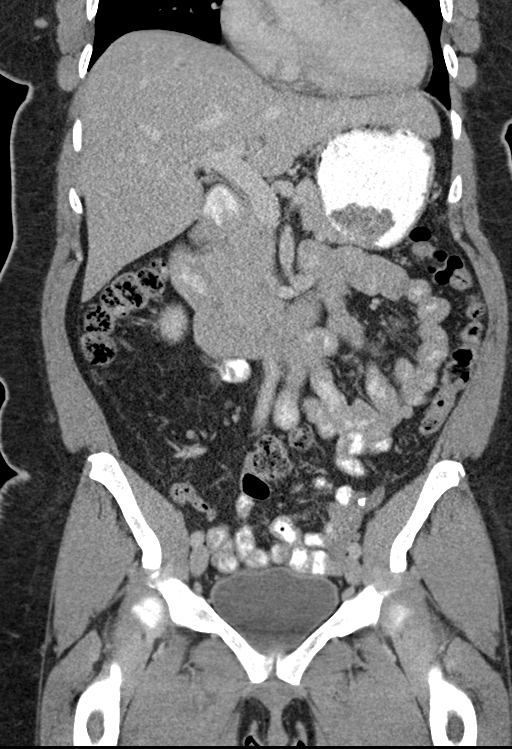

[16 of 46 positions shown; findings below may reference images not displayed]

FINDINGS: Lower chest: No acute abnormality.

Hepatobiliary: No focal liver abnormality is seen. No gallstones,
gallbladder wall thickening, or biliary dilatation.

Pancreas: Unremarkable. No pancreatic ductal dilatation or
surrounding inflammatory changes.

Spleen: Normal in size without focal abnormality.

Adrenals/Urinary Tract: Adrenal glands are unremarkable. Kidneys are
normal, without renal calculi, focal lesion, or hydronephrosis.
Bladder is unremarkable.

Stomach/Bowel: Stomach is within normal limits. Appendix appears
normal. No evidence of bowel wall thickening, distention, or
inflammatory changes.

Vascular/Lymphatic: No significant vascular findings are present. No
enlarged abdominal or pelvic lymph nodes.

Reproductive: Uterus and ovaries are unremarkable. Bilateral
fallopian tube ligation clips are noted.

Other: Small fat containing periumbilical hernia is noted. No
abnormal fluid collection is noted.

Musculoskeletal: No acute or significant osseous findings.
IMPRESSION: Small fat containing periumbilical hernia. No other abnormality seen
in the abdomen or pelvis.

## 2017-05-05 ENCOUNTER — Emergency Department (HOSPITAL_COMMUNITY)
Admission: EM | Admit: 2017-05-05 | Discharge: 2017-05-06 | Disposition: A | Discharge status: Home / Self Care | Payer: Medicare Other | Attending: Emergency Medicine | Admitting: Emergency Medicine

## 2017-05-05 ENCOUNTER — Encounter (HOSPITAL_COMMUNITY): Payer: Self-pay | Admitting: Emergency Medicine

## 2017-05-05 ENCOUNTER — Other Ambulatory Visit: Payer: Self-pay

## 2017-05-05 ENCOUNTER — Ambulatory Visit: Payer: Self-pay

## 2017-05-05 ENCOUNTER — Emergency Department (HOSPITAL_COMMUNITY): Payer: Medicare Other

## 2017-05-05 DIAGNOSIS — R202 Paresthesia of skin: Secondary | ICD-10-CM | POA: Diagnosis not present

## 2017-05-05 DIAGNOSIS — R51 Headache: Secondary | ICD-10-CM | POA: Diagnosis present

## 2017-05-05 DIAGNOSIS — Z79899 Other long term (current) drug therapy: Secondary | ICD-10-CM | POA: Insufficient documentation

## 2017-05-05 DIAGNOSIS — Z794 Long term (current) use of insulin: Secondary | ICD-10-CM | POA: Insufficient documentation

## 2017-05-05 DIAGNOSIS — E1165 Type 2 diabetes mellitus with hyperglycemia: Secondary | ICD-10-CM | POA: Diagnosis not present

## 2017-05-05 DIAGNOSIS — R739 Hyperglycemia, unspecified: Secondary | ICD-10-CM

## 2017-05-05 DIAGNOSIS — B20 Human immunodeficiency virus [HIV] disease: Secondary | ICD-10-CM | POA: Insufficient documentation

## 2017-05-05 DIAGNOSIS — I10 Essential (primary) hypertension: Secondary | ICD-10-CM | POA: Insufficient documentation

## 2017-05-05 DIAGNOSIS — R519 Headache, unspecified: Secondary | ICD-10-CM

## 2017-05-05 LAB — I-STAT TROPONIN, ED: TROPONIN I, POC: 0 ng/mL (ref 0.00–0.08)

## 2017-05-05 LAB — TYPE AND SCREEN
ABO/RH(D): O POS
Antibody Screen: NEGATIVE

## 2017-05-05 LAB — URINALYSIS, ROUTINE W REFLEX MICROSCOPIC
BILIRUBIN URINE: NEGATIVE
Hgb urine dipstick: NEGATIVE
KETONES UR: NEGATIVE mg/dL
LEUKOCYTES UA: NEGATIVE
NITRITE: NEGATIVE
PH: 5 (ref 5.0–8.0)
Protein, ur: NEGATIVE mg/dL
SPECIFIC GRAVITY, URINE: 1.027 (ref 1.005–1.030)

## 2017-05-05 LAB — CBC WITH DIFFERENTIAL/PLATELET
Basophils Absolute: 0 10*3/uL (ref 0.0–0.1)
Basophils Relative: 0 %
Eosinophils Absolute: 0.3 10*3/uL (ref 0.0–0.7)
Eosinophils Relative: 4 %
HEMATOCRIT: 36.4 % (ref 36.0–46.0)
Hemoglobin: 11.5 g/dL — ABNORMAL LOW (ref 12.0–15.0)
LYMPHS PCT: 39 %
Lymphs Abs: 2.3 10*3/uL (ref 0.7–4.0)
MCH: 24.9 pg — ABNORMAL LOW (ref 26.0–34.0)
MCHC: 31.6 g/dL (ref 30.0–36.0)
MCV: 78.8 fL (ref 78.0–100.0)
MONO ABS: 0.3 10*3/uL (ref 0.1–1.0)
Monocytes Relative: 5 %
NEUTROS ABS: 3 10*3/uL (ref 1.7–7.7)
Neutrophils Relative %: 52 %
Platelets: 327 10*3/uL (ref 150–400)
RBC: 4.62 MIL/uL (ref 3.87–5.11)
RDW: 13.7 % (ref 11.5–15.5)
WBC: 5.9 10*3/uL (ref 4.0–10.5)

## 2017-05-05 LAB — BASIC METABOLIC PANEL
Anion gap: 8 (ref 5–15)
BUN: 12 mg/dL (ref 6–20)
CO2: 27 mmol/L (ref 22–32)
Calcium: 9.4 mg/dL (ref 8.9–10.3)
Chloride: 100 mmol/L — ABNORMAL LOW (ref 101–111)
Creatinine, Ser: 0.96 mg/dL (ref 0.44–1.00)
GFR calc Af Amer: >60 mL/min (ref >60)
GLUCOSE: 343 mg/dL — AB (ref 65–99)
Potassium: 4 mmol/L (ref 3.5–5.1)
Sodium: 135 mmol/L (ref 135–145)

## 2017-05-05 LAB — POC URINE PREG, ED: Preg Test, Ur: NEGATIVE

## 2017-05-05 LAB — CBG MONITORING, ED: GLUCOSE-CAPILLARY: 346 mg/dL — AB (ref 65–99)

## 2017-05-05 MED ORDER — DIPHENHYDRAMINE HCL 50 MG/ML IJ SOLN
25.0000 mg | Freq: Once | INTRAMUSCULAR | Status: AC
  Administered 2017-05-05: 25 mg via INTRAVENOUS
  Filled 2017-05-05: qty 1

## 2017-05-05 MED ORDER — KETOROLAC TROMETHAMINE 15 MG/ML IJ SOLN
15.0000 mg | Freq: Once | INTRAMUSCULAR | Status: AC
  Administered 2017-05-05: 15 mg via INTRAVENOUS
  Filled 2017-05-05: qty 1

## 2017-05-05 MED ORDER — METOCLOPRAMIDE HCL 5 MG/ML IJ SOLN
10.0000 mg | Freq: Once | INTRAMUSCULAR | Status: AC
  Administered 2017-05-05: 10 mg via INTRAVENOUS
  Filled 2017-05-05: qty 2

## 2017-05-05 MED ORDER — INSULIN ASPART 100 UNIT/ML ~~LOC~~ SOLN
10.0000 [IU] | Freq: Once | SUBCUTANEOUS | Status: AC
  Administered 2017-05-05: 10 [IU] via SUBCUTANEOUS
  Filled 2017-05-05: qty 1

## 2017-05-05 MED ORDER — SODIUM CHLORIDE 0.9 % IV BOLUS (SEPSIS)
1000.0000 mL | Freq: Once | INTRAVENOUS | Status: AC
  Administered 2017-05-05: 1000 mL via INTRAVENOUS

## 2017-05-05 NOTE — ED Triage Notes (Signed)
Pt c/o HA and numbness on her left side since this morning when she woke up getting worse tonight, pt states LSW last night prior to go to bed, pt able to ambulate to the room, AO x 4 , no neuro deficit noticed. Pt states she is been bleeding on her stool for the past few weeks.

## 2017-05-05 NOTE — Telephone Encounter (Signed)
Called patient to follow-up as she has not been to ED. Patient stated she is currently en route to [facility] ED.

## 2017-05-05 NOTE — Telephone Encounter (Signed)
Pt. called and reported she got up at 8:00 AM with numbness in her hands, then started having numbness in left side of her face, neck and left side of body.  Reported her left hand had been weak over past 7 days.  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  Able to speak clearly and articulate her complaints.  C/o left sided headache; rates at 6/10.  Reported that she had temporary episode of seeing stars and wavy lines in her left eye.  Reported that vision is clear at this time. Per protocol, advised to call 911 at this time.  The pt. stated she prefers to have her daughter take her to the ER, because she is concerned it will scare her younger children. Stated she lives very near [facility].  Pt. confirmed she will go directly to the hospital.      Reason for Disposition . [1] Numbness (i.e., loss of sensation) of the face, arm / hand, or leg / foot on one side of the body AND [2] sudden onset AND [3] present now  Answer Assessment - Initial Assessment Questions 1. SYMPTOM: "What is the main symptom you are concerned about?" (e.g., weakness, numbness)   Hands tingling; left side numb; headache on left side of head / neck; has numbness in left side of face and neck.  2. ONSET: "When did this start?" (minutes, hours, days; while sleeping)     8:00 AM 3. LAST NORMAL: "When was the last time you were normal (no symptoms)?"     Last night she felt okay when she went to bed 4. PATTERN "Does this come and go, or has it been constant since it started?"  "Is it present now?"     Left sided tingling usually comes and goes since she had Meningitis in 1998, but this morning it has been constant.  5. CARDIAC SYMPTOMS: "Have you had any of the following symptoms: chest pain, difficulty breathing, palpitations?"     Denies all the above cardiac symptoms 6. NEUROLOGIC SYMPTOMS: "Have you had any of the following  symptoms: headache, dizziness, vision loss, double vision, changes in speech, unsteady on your feet?"     C/o headache @ 6/10; was seeing stars and wavy lines (L) eye ; left hand weak over past 7 days 7. OTHER SYMPTOMS: "Do you have any other symptoms?"     No  8. PREGNANCY: "Is there any chance you are pregnant?" "When was your last menstrual period?"     No; had tubal ligation  Protocols used: NEUROLOGIC DEFICIT-A-AH

## 2017-05-05 NOTE — ED Provider Notes (Signed)
Medical screening exam  46 year old female presents today with complaints of headache, weakness and tingling.  Patient reports last night she went to bed in her usual state of health.  She woke up this morning with a headache, tingling throughout her bilateral upper and lower extremities and numbness on the left side of her face.  Patient notes that the tingling has localized to the left upper and lower extremity, right asymptomatic at this time.  Patient denies any fever, neck stiffness, she denies any slurred speech, confusion, or any other neurological deficits other than those noted.  Patient denies any history of stroke.  She is not on any anticoagulation.   Brief neuro exam shows left-sided facial sensory changes, no distal changes, strength normal throughout.   Patient will have basic labs, head CT, low suspicion for acute stroke given her history.    Large Artery Stroke Screening     Weakness:  Patient shows no weakness.    Vision:  NONE  Aphasia:  NONE  Neglect:  NONE:  VAN =  Negative  If patient has any weakness PLUS any one of the below: Visual Disturbance (field cut, double, or blind vision) Aphasia (inability to speak or understand) Neglect (gaze to one side or ignoring one side) This is likely a large artery clot (cortical symptoms) = VAN Positive  Vitals:   05/05/17 2002  BP: 111/64  Pulse: 85  Resp: 18  Temp: 97.8 F (36.6 C)  SpO2: 97%     Okey Regal, PA-C 05/05/17 2015    Okey Regal, PA-C 05/05/17 2017    Sherwood Gambler, MD 05/05/17 [phone]

## 2017-05-05 NOTE — ED Notes (Signed)
PA at the bedside.

## 2017-05-05 NOTE — ED Provider Notes (Signed)
[facility] EMERGENCY DEPARTMENT Provider Note   CSN: [id number] Arrival date & time: 05/05/17  1845     History   Chief Complaint Chief Complaint  Patient presents with  . Headache  . Numbness    HPI Brenda [name] is a 46 y.o. female.  Patient presents to the emergency department with a chief complaint of headache and tingling in her hands and feet.  She states that she awakened with the symptoms this morning.  She states that the symptoms have been intermittent throughout the day.  She states that she has the tingling in bilateral hands and bilateral feet.  She denies any weakness.  She denies any vision changes or speech changes.  She states that her headache is moderate.  She has not taken anything for her symptoms.  She states that she has felt the symptoms before when her blood sugar has been high.   The history is provided by the patient. No language interpreter was used.    Past Medical History:  Diagnosis Date  . Arthritis    knees  . Chronic folliculitis   . Diabetes mellitus   . Hemorrhoids   . HIV disease ([place])   . Hypertension   . Iron deficiency anemia due to chronic blood loss   . Irregular menstrual cycle   . 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 Migraines   . Obesity   . Pneumothorax, traumatic     Patient Active Problem List   Diagnosis Date Noted  . Iron deficiency anemia due to chronic blood loss - hemorhoids and menses 09/21/2016  . Prolapsed internal hemorrhoids, grade 3 - bleeding 09/21/2016  . Syncope 08/14/2015  . Rash and nonspecific skin eruption 06/30/2011  . Obesity   . MENORRHAGIA 09/04/2008  . Insulin dependent type 2 diabetes mellitus, uncontrolled ([place]) 02/20/2007  . Human immunodeficiency virus (HIV) disease ([place]) 03/11/2006  . 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 Migraine headache 03/11/2006  . FOLLICULITIS 96/29/5284    Past Surgical History:  Procedure Laterality Date  . CHEST TUBE  INSERTION    . HEMORRHOID BANDING    . HYSTEROSCOPY W/D&C  Removal of Endometrial Polyp   Dr. Raphael Gibney 2011  . LAPAROSCOPIC LYSIS OF ADHESIONS  01/10/2014   Procedure: LAPAROSCOPIC LYSIS OF ADHESIONS;  Surgeon: Ena Dawley, MD;  Location: [facility];  Service: Gynecology;;  . LAPAROSCOPY N/A 01/10/2014   Procedure: LAPAROSCOPY OPERATIVE;  Surgeon: Ena Dawley, MD;  Location: [facility];  Service: Gynecology;  Laterality: N/A;  . TUBAL LIGATION    . UMBILICAL HERNIA REPAIR  01/10/2014   Procedure: HERNIA REPAIR UMBILICAL ADULT;  Surgeon: Ena Dawley, MD;  Location: [facility];  Service: Gynecology;;    OB History    Gravida Para Term Preterm AB Living   8 4 4   4 4    SAB TAB Ectopic Multiple Live Births   4               Home Medications    Prior to Admission medications   Medication Sig Start Date End Date Taking? Authorizing Provider  GENVOYA 150-150-200-10 MG TABS tablet take 1 tablet by mouth once daily WITH BREAKFAST 08/03/16  Yes Campbell Riches, MD  glipiZIDE (GLUCOTROL) 10 MG tablet Take 10 mg by mouth daily.   Yes [provider]  hydrOXYzine (ATARAX/VISTARIL) 25 MG tablet Take 25 mg by mouth at bedtime. 11/05/16  Yes [provider]  ibuprofen (ADVIL,MOTRIN) 800 MG tablet Take 800 mg by mouth  daily as needed for pain. 10/08/16  Yes [provider]  Insulin Glargine (TOUJEO SOLOSTAR [place]) Inject 50 Units into the skin every morning.    Yes [provider]  lisinopril-hydrochlorothiazide (PRINZIDE,ZESTORETIC) 20-25 MG tablet Take 1 tablet by mouth daily. 04/07/16  Yes [provider]  LYRICA 75 MG capsule Take 75 mg by mouth 2 (two) times daily.  11/05/16  Yes [provider]  metFORMIN (GLUMETZA) 1000 MG (MOD) 24 hr tablet Take 1 tablet (1,000 mg total) by mouth 2 (two) times daily. 08/17/16 05/05/17 Yes Nafziger, Tommi Rumps, NP  methocarbamol (ROBAXIN) 500 MG tablet Take 1 tablet (500 mg total) by mouth every 8 (eight) hours as needed for  muscle spasms. 11/13/16  Yes Jola Schmidt, MD  ondansetron (ZOFRAN-ODT) 8 MG disintegrating tablet Take 8 mg by mouth every 8 (eight) hours as needed for nausea/vomiting.   Yes [provider]  Oxycodone HCl 10 MG TABS Take 10 mg by mouth 2 (two) times daily. 04/08/17  Yes [provider]  promethazine (PHENERGAN) 25 MG tablet Take 25 mg by mouth every 6 (six) hours as needed for nausea/vomiting. 06/11/16  Yes [provider]  traZODone (DESYREL) 100 MG tablet Take 100 mg by mouth at bedtime. 11/05/16  Yes [provider]  VOLTAREN 1 % GEL Apply 2 g topically daily as needed (for knee pain).  08/03/16  Yes [provider]  zolpidem (AMBIEN) 10 MG tablet Take 10 mg by mouth at bedtime as needed for sleep. 06/11/16  Yes [provider]  glucose blood test strip Use as instructed Patient not taking: Reported on 05/05/2017 08/17/16   Dorothyann Peng, NP  traMADol (ULTRAM) 50 MG tablet Take 1 tablet (50 mg total) by mouth every 6 (six) hours as needed. Patient not taking: Reported on 05/05/2017 11/13/16   Jola Schmidt, MD    Family History Family History  Problem Relation Age of Onset  . Diabetes Mother   . Heart failure Mother   . Heart failure Father   . Diabetes Father   . Heart failure Brother   . Diabetes Brother   . Colon cancer Unknown        neg hx.  . Diabetes Maternal Grandmother   . Alzheimer's disease Paternal Grandmother     Social History Social History   Tobacco Use  . Smoking status: Never Smoker  . Smokeless tobacco: Never Used  Substance Use Topics  . Alcohol use: No    Alcohol/week: 0.0 oz  . Drug use: No     Allergies   Cortisone; Hydrocodone-acetaminophen; and Morphine   Review of Systems Review of Systems  All other systems reviewed and are negative.    Physical Exam Updated Vital Signs BP (!) 141/96   Pulse 81   Temp 97.8 F (36.6 C) (Oral)   Resp 18   Ht 5\' 8"  (1.727 m)   Wt 106.6 kg (235 lb)    SpO2 97%   BMI 35.73 kg/m   Physical Exam  Constitutional: She is oriented to person, place, and time. She appears well-developed and well-nourished.  HENT:  Head: Normocephalic and atraumatic.  Eyes: Conjunctivae and EOM are normal. Pupils are equal, round, and reactive to light.  Neck: Normal range of motion. Neck supple.  Cardiovascular: Normal rate and regular rhythm. Exam reveals no gallop and no friction rub.  No murmur heard. Pulmonary/Chest: Effort normal and breath sounds normal. No respiratory distress. She has no wheezes. She has no rales. She exhibits no tenderness.  Abdominal: Soft. Bowel sounds are normal. She exhibits no distension and no mass. There is no tenderness. There is no rebound and no guarding.  Musculoskeletal: Normal range of motion. She exhibits no edema or tenderness.  Neurological: She is alert and oriented to person, place, and time.  Skin: Skin is warm and dry.  Psychiatric: She has a normal mood and affect. Her behavior is normal. Judgment and thought content normal.  Nursing note and vitals reviewed.    ED Treatments / Results  Labs (all labs ordered are listed, but only abnormal results are displayed) Labs Reviewed  CBC WITH DIFFERENTIAL/PLATELET - Abnormal; Notable for the following components:      Result Value   Hemoglobin 11.5 (*)    MCH 24.9 (*)    All other components within normal limits  BASIC METABOLIC PANEL - Abnormal; Notable for the following components:   Chloride 100 (*)    Glucose, Bld 343 (*)    All other components within normal limits  URINALYSIS, ROUTINE W REFLEX MICROSCOPIC - Abnormal; Notable for the following components:   APPearance HAZY (*)    Glucose, UA >=500 (*)    Bacteria, UA RARE (*)    Squamous Epithelial / LPF 0-5 (*)    All other components within normal limits  I-STAT TROPONIN, ED  POC URINE PREG, ED  TYPE AND SCREEN    EKG  EKG Interpretation None       Radiology Ct Head Wo Contrast  Result  Date: 05/05/2017 CLINICAL DATA:  Severe headache, worst headache of life. EXAM: CT HEAD WITHOUT CONTRAST TECHNIQUE: Contiguous axial images were obtained from the base of the skull through the vertex without intravenous contrast. COMPARISON:  Head CT 08/14/2015 FINDINGS: Brain: No intracranial hemorrhage, mass effect, or midline shift. No hydrocephalus. The basilar cisterns are patent. No evidence of territorial infarct or acute ischemia. No extra-axial or intracranial fluid collection. Vascular: No hyperdense vessel or unexpected calcification. Skull: No fracture or focal lesion. Sinuses/Orbits: Chronic mucosal thickening of the paranasal sinuses, with mild improvement from prior. No fluid levels. Visualized orbits are normal. Other: None. IMPRESSION: 1. No acute abnormality or explanation for headache. 2. Chronic sinus disease with improvement from prior exam. Electronically Signed   By: Jeb Levering M.D.   On: 05/05/2017 21:47    Procedures Procedures (including critical care time)  Medications Ordered in ED Medications  ketorolac (TORADOL) 15 MG/ML injection 15 mg (not administered)  sodium chloride 0.9 % bolus 1,000 mL (not administered)  diphenhydrAMINE (BENADRYL) injection 25 mg (not administered)  metoCLOPramide (REGLAN) injection 10 mg (not administered)  insulin aspart (novoLOG) injection 10 Units (not administered)     Initial Impression / Assessment and Plan / ED Course  I have reviewed the triage vital signs and the nursing notes.  Pertinent labs & imaging results that were available during my care of the patient were reviewed by me and considered in my medical decision making (see chart for details).     Patient with headache and stocking glove tingling.  She has no weakness.  She has no vision changes or speech changes.  She reports having had these symptoms in the past when her blood sugar has been elevated.  Glucose is 343 today.  Please give fluids and some insulin.   Remaining labs are reassuring.  CT imaging ordered in triage is negative.  Doubt stroke or TIA.  Patient has no weakness.  Pt HA treated and improved while in ED.  Presentation is non concerning  for [facility], ICH, Meningitis, or temporal arteritis. Pt is afebrile with no focal neuro deficits, nuchal rigidity, or change in vision. Pt is to follow up with PCP to discuss prophylactic medication. Pt verbalizes understanding and is agreeable with plan to dc.    Final Clinical Impressions(s) / ED Diagnoses   Final diagnoses:  Nonintractable headache, unspecified chronicity pattern, unspecified headache type  Tingling  Hyperglycemia    ED Discharge Orders    None       Montine Circle, PA-C 05/06/17 0024    Davonna Belling, MD 05/06/17 [phone]

## 2017-06-14 ENCOUNTER — Other Ambulatory Visit: Payer: Self-pay | Admitting: *Deleted

## 2017-06-14 DIAGNOSIS — B20 Human immunodeficiency virus [HIV] disease: Secondary | ICD-10-CM

## 2017-06-16 ENCOUNTER — Other Ambulatory Visit: Payer: Self-pay

## 2017-06-20 ENCOUNTER — Ambulatory Visit: Payer: Self-pay | Admitting: *Deleted

## 2017-06-20 NOTE — Telephone Encounter (Signed)
ANA   FROM    [facility]  CALLED      ID  NUMBER  [id number] REQUESTING      LATEST  BP  AND HGB  a1c   Results  And dates   results  Given to  Pt

## 2017-06-21 ENCOUNTER — Other Ambulatory Visit: Payer: Medicare Other

## 2017-06-21 DIAGNOSIS — B20 Human immunodeficiency virus [HIV] disease: Secondary | ICD-10-CM

## 2017-06-22 LAB — CBC WITH DIFFERENTIAL/PLATELET
BASOS PCT: 0.7 %
Basophils Absolute: 52 cells/uL (ref 0–200)
Eosinophils Absolute: 400 cells/uL (ref 15–500)
Eosinophils Relative: 5.4 %
HCT: 35.8 % (ref 35.0–45.0)
Hemoglobin: 11.6 g/dL — ABNORMAL LOW (ref 11.7–15.5)
Lymphs Abs: 2102 cells/uL (ref 850–3900)
MCH: 24.5 pg — AB (ref 27.0–33.0)
MCHC: 32.4 g/dL (ref 32.0–36.0)
MCV: 75.7 fL — ABNORMAL LOW (ref 80.0–100.0)
MONOS PCT: 6.3 %
MPV: 10.4 fL (ref 7.5–12.5)
NEUTROS PCT: 59.2 %
Neutro Abs: 4381 cells/uL (ref 1500–7800)
PLATELETS: 408 10*3/uL — AB (ref 140–400)
RBC: 4.73 10*6/uL (ref 3.80–5.10)
RDW: 13.9 % (ref 11.0–15.0)
TOTAL LYMPHOCYTE: 28.4 %
WBC mixed population: 466 cells/uL (ref 200–950)
WBC: 7.4 10*3/uL (ref 3.8–10.8)

## 2017-06-22 LAB — COMPREHENSIVE METABOLIC PANEL
AG RATIO: 1.2 (calc) (ref 1.0–2.5)
ALT: 13 U/L (ref 6–29)
AST: 15 U/L (ref 10–35)
Albumin: 4.1 g/dL (ref 3.6–5.1)
Alkaline phosphatase (APISO): 100 U/L (ref 33–115)
BUN: 9 mg/dL (ref 7–25)
CO2: 29 mmol/L (ref 20–32)
CREATININE: 0.9 mg/dL (ref 0.50–1.10)
Calcium: 9.8 mg/dL (ref 8.6–10.2)
Chloride: 99 mmol/L (ref 98–110)
GLUCOSE: 265 mg/dL — AB (ref 65–99)
Globulin: 3.5 g/dL (calc) (ref 1.9–3.7)
Potassium: 4.2 mmol/L (ref 3.5–5.3)
Sodium: 136 mmol/L (ref 135–146)
Total Bilirubin: 0.3 mg/dL (ref 0.2–1.2)
Total Protein: 7.6 g/dL (ref 6.1–8.1)

## 2017-06-22 LAB — T-HELPER CELL (CD4) - (RCID CLINIC ONLY)
CD4 % Helper T Cell: 26 % — ABNORMAL LOW (ref 33–55)
CD4 T Cell Abs: 610 /uL (ref 400–2700)

## 2017-06-23 LAB — HIV-1 RNA QUANT-NO REFLEX-BLD
HIV 1 RNA QUANT: DETECTED {copies}/mL — AB
HIV-1 RNA Quant, Log: <1.3 Log copies/mL — AB

## 2017-07-06 ENCOUNTER — Ambulatory Visit: Payer: Self-pay | Admitting: Infectious Diseases

## 2017-07-18 ENCOUNTER — Ambulatory Visit: Payer: Self-pay | Admitting: Infectious Diseases

## 2017-07-25 ENCOUNTER — Emergency Department (HOSPITAL_COMMUNITY)
Admission: EM | Admit: 2017-07-25 | Discharge: 2017-07-25 | Discharge status: Against Medical Advice | Payer: Medicare Other | Attending: Emergency Medicine | Admitting: Emergency Medicine

## 2017-07-25 ENCOUNTER — Emergency Department (HOSPITAL_COMMUNITY): Payer: Medicare Other

## 2017-07-25 ENCOUNTER — Encounter (HOSPITAL_COMMUNITY): Payer: Self-pay | Admitting: Emergency Medicine

## 2017-07-25 ENCOUNTER — Other Ambulatory Visit: Payer: Self-pay

## 2017-07-25 DIAGNOSIS — R202 Paresthesia of skin: Secondary | ICD-10-CM | POA: Insufficient documentation

## 2017-07-25 DIAGNOSIS — M79601 Pain in right arm: Secondary | ICD-10-CM | POA: Diagnosis not present

## 2017-07-25 DIAGNOSIS — R51 Headache: Secondary | ICD-10-CM | POA: Diagnosis not present

## 2017-07-25 DIAGNOSIS — M792 Neuralgia and neuritis, unspecified: Secondary | ICD-10-CM

## 2017-07-25 LAB — BASIC METABOLIC PANEL
Anion gap: 10 (ref 5–15)
BUN: 8 mg/dL (ref 6–20)
CALCIUM: 9.2 mg/dL (ref 8.9–10.3)
CO2: 26 mmol/L (ref 22–32)
Chloride: 101 mmol/L (ref 101–111)
Creatinine, Ser: 0.87 mg/dL (ref 0.44–1.00)
GFR calc Af Amer: >60 mL/min (ref >60)
GLUCOSE: 345 mg/dL — AB (ref 65–99)
POTASSIUM: 3.9 mmol/L (ref 3.5–5.1)
SODIUM: 137 mmol/L (ref 135–145)

## 2017-07-25 LAB — I-STAT BETA HCG BLOOD, ED (MC, WL, AP ONLY)

## 2017-07-25 LAB — CBC
HEMATOCRIT: 37.8 % (ref 36.0–46.0)
Hemoglobin: 11.7 g/dL — ABNORMAL LOW (ref 12.0–15.0)
MCH: 24.2 pg — ABNORMAL LOW (ref 26.0–34.0)
MCHC: 31 g/dL (ref 30.0–36.0)
MCV: 78.1 fL (ref 78.0–100.0)
Platelets: 410 10*3/uL — ABNORMAL HIGH (ref 150–400)
RBC: 4.84 MIL/uL (ref 3.87–5.11)
RDW: 13.9 % (ref 11.5–15.5)
WBC: 5.3 10*3/uL (ref 4.0–10.5)

## 2017-07-25 LAB — I-STAT TROPONIN, ED: TROPONIN I, POC: 0.01 ng/mL (ref 0.00–0.08)

## 2017-07-25 NOTE — ED Triage Notes (Signed)
Pt states right arm pain and right shoulder pain starting Friday with right sided facial numbness, also has pain to LEFT side of neck. Denies chest pain or shortness of breath.

## 2017-07-25 NOTE — ED Provider Notes (Addendum)
Brief screening exam.  Patient c/o dull headache, moderate, with left facial numbness. Patient also with severe radicular pain to right arm w right arm paresthesias. Pain constant, dull worse w movement neck. No fever or chills. No nausea or vomiting.  States saw her doctor who requested ct imaging.  Imaging tests ordered.  Vitals:   07/25/17 1624 07/25/17 1832  BP: (!) 163/94 (!) 158/89  Pulse: 89 99  Resp: 18 18  Temp:    SpO2: 99% 100%  C/T spine non tender, aligned, no step off. Right trapezius tenderness. Good passive rom right shoulder and elbow without pain. Radial pulse 2+. Motor intact bil, stre 5/5. sens grossly intact.      Lajean Saver, MD 07/25/17 1414     Lajean Saver, MD 07/26/17 [phone]

## 2017-08-25 ENCOUNTER — Telehealth: Payer: Self-pay | Admitting: *Deleted

## 2017-08-25 ENCOUNTER — Other Ambulatory Visit: Payer: Self-pay | Admitting: Pharmacist

## 2017-08-25 DIAGNOSIS — B20 Human immunodeficiency virus [HIV] disease: Secondary | ICD-10-CM

## 2017-08-25 MED ORDER — ELVITEG-COBIC-EMTRICIT-TENOFAF 150-150-200-10 MG PO TABS: 1.0000 | ORAL_TABLET | Freq: Every day | ORAL | 0 refills | Status: DC

## 2017-08-25 NOTE — Telephone Encounter (Signed)
Patient walked in, asked for a refill of genvoya. She has not been seen by dr hatcher since 2017, but has not missed a dose until this weekend. Her last refill was 08/2016 for 1 year. Her last labs on 06/2017 were undetectable. She is scheduled for follow up with Dr Johnnye Sima on 4/3. 30 supply sent to [facility] on [address].  Landis Gandy, RN

## 2017-09-07 ENCOUNTER — Other Ambulatory Visit: Payer: Self-pay | Admitting: Behavioral Health

## 2017-09-07 ENCOUNTER — Telehealth: Payer: Self-pay | Admitting: Behavioral Health

## 2017-09-07 ENCOUNTER — Ambulatory Visit: Payer: Self-pay | Admitting: Infectious Diseases

## 2017-09-07 DIAGNOSIS — B20 Human immunodeficiency virus [HIV] disease: Secondary | ICD-10-CM

## 2017-09-07 MED ORDER — ELVITEG-COBIC-EMTRICIT-TENOFAF 150-150-200-10 MG PO TABS: 1.0000 | ORAL_TABLET | Freq: Every day | ORAL | 1 refills | Status: DC

## 2017-09-07 NOTE — Telephone Encounter (Signed)
Patient was called by front desk staff to reschedule todays appointment 09/07/2017.  Patient states she will run out of her medications before her rescheduled appointment.  Appointment rescheduled for 10/27/2017 and refills sent to [facility] on [address]. Patient verbalized understanding. Pricilla Riffle RN

## 2017-10-06 ENCOUNTER — Emergency Department (HOSPITAL_BASED_OUTPATIENT_CLINIC_OR_DEPARTMENT_OTHER)
Admission: EM | Admit: 2017-10-06 | Discharge: 2017-10-06 | Disposition: A | Discharge status: Home / Self Care | Payer: Medicare Other | Attending: Emergency Medicine | Admitting: Emergency Medicine

## 2017-10-06 ENCOUNTER — Other Ambulatory Visit: Payer: Self-pay

## 2017-10-06 ENCOUNTER — Encounter (HOSPITAL_BASED_OUTPATIENT_CLINIC_OR_DEPARTMENT_OTHER): Payer: Self-pay | Admitting: Emergency Medicine

## 2017-10-06 DIAGNOSIS — I10 Essential (primary) hypertension: Secondary | ICD-10-CM | POA: Insufficient documentation

## 2017-10-06 DIAGNOSIS — N898 Other specified noninflammatory disorders of vagina: Secondary | ICD-10-CM | POA: Diagnosis not present

## 2017-10-06 DIAGNOSIS — Z79899 Other long term (current) drug therapy: Secondary | ICD-10-CM | POA: Diagnosis not present

## 2017-10-06 DIAGNOSIS — M47812 Spondylosis without myelopathy or radiculopathy, cervical region: Secondary | ICD-10-CM | POA: Diagnosis not present

## 2017-10-06 DIAGNOSIS — M545 Low back pain: Secondary | ICD-10-CM | POA: Diagnosis not present

## 2017-10-06 DIAGNOSIS — R351 Nocturia: Secondary | ICD-10-CM | POA: Diagnosis not present

## 2017-10-06 DIAGNOSIS — B20 Human immunodeficiency virus [HIV] disease: Secondary | ICD-10-CM | POA: Insufficient documentation

## 2017-10-06 DIAGNOSIS — E1165 Type 2 diabetes mellitus with hyperglycemia: Secondary | ICD-10-CM | POA: Diagnosis not present

## 2017-10-06 DIAGNOSIS — B3731 Acute candidiasis of vulva and vagina: Secondary | ICD-10-CM

## 2017-10-06 DIAGNOSIS — Z7984 Long term (current) use of oral hypoglycemic drugs: Secondary | ICD-10-CM | POA: Diagnosis not present

## 2017-10-06 DIAGNOSIS — M25562 Pain in left knee: Secondary | ICD-10-CM | POA: Diagnosis not present

## 2017-10-06 DIAGNOSIS — M25462 Effusion, left knee: Secondary | ICD-10-CM | POA: Diagnosis not present

## 2017-10-06 DIAGNOSIS — E119 Type 2 diabetes mellitus without complications: Secondary | ICD-10-CM | POA: Diagnosis not present

## 2017-10-06 DIAGNOSIS — B373 Candidiasis of vulva and vagina: Secondary | ICD-10-CM | POA: Diagnosis not present

## 2017-10-06 LAB — URINALYSIS, MICROSCOPIC (REFLEX)

## 2017-10-06 LAB — URINALYSIS, ROUTINE W REFLEX MICROSCOPIC
BILIRUBIN URINE: NEGATIVE
Glucose, UA: >=500 mg/dL — AB
KETONES UR: NEGATIVE mg/dL
Leukocytes, UA: NEGATIVE
Nitrite: NEGATIVE
PROTEIN: NEGATIVE mg/dL
SPECIFIC GRAVITY, URINE: 1.015 (ref 1.005–1.030)
pH: 5.5 (ref 5.0–8.0)

## 2017-10-06 LAB — WET PREP, GENITAL
Clue Cells Wet Prep HPF POC: NONE SEEN
SPERM: NONE SEEN
TRICH WET PREP: NONE SEEN

## 2017-10-06 LAB — PREGNANCY, URINE: Preg Test, Ur: NEGATIVE

## 2017-10-06 LAB — GC/CHLAMYDIA PROBE AMP (~~LOC~~) NOT AT ARMC
Chlamydia: NEGATIVE
NEISSERIA GONORRHEA: NEGATIVE

## 2017-10-06 MED ORDER — FLUCONAZOLE 50 MG PO TABS
150.0000 mg | ORAL_TABLET | Freq: Once | ORAL | Status: AC
  Administered 2017-10-06: 150 mg via ORAL
  Filled 2017-10-06: qty 1

## 2017-10-06 MED ORDER — FLUCONAZOLE 150 MG PO TABS: ORAL_TABLET | ORAL | 0 refills | Status: DC

## 2017-10-06 NOTE — ED Triage Notes (Signed)
Pt c/o vaginal itching, irritation and pain.

## 2017-10-06 NOTE — ED Notes (Signed)
Pt states she is unable to provide urine sample at this time.

## 2017-10-06 NOTE — ED Provider Notes (Signed)
[facility] Provider Note: Georgena Spurling, MD, FACEP  CSN: [id number] MRN: [id number] ARRIVAL: 10/06/17 at [place]: [place]  Vaginal Itching   HISTORY OF PRESENT ILLNESS  10/06/17 2:30 AM Brenda [name] is a 47 y.o. female with a one-week history of vulvovaginal irritation.  She is having moderate itching and burning, worse with urination.  She is also having a vaginal discharge.  She thinks her symptoms may be due to a new detergent as her daughter is having similar symptoms.  She does have a history of HIV disease and is on antiretroviral therapy.   Past Medical History:  Diagnosis Date  . Arthritis    knees  . Chronic folliculitis   . Diabetes mellitus   . Hemorrhoids   . HIV disease ([place])   . Hypertension   . Iron deficiency anemia due to chronic blood loss   . Irregular menstrual cycle   . 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 Migraines   . Obesity   . Pneumothorax, traumatic     Past Surgical History:  Procedure Laterality Date  . CHEST TUBE INSERTION    . HEMORRHOID BANDING    . HYSTEROSCOPY W/D&C  Removal of Endometrial Polyp   Dr. Raphael Gibney 2011  . LAPAROSCOPIC LYSIS OF ADHESIONS  01/10/2014   Procedure: LAPAROSCOPIC LYSIS OF ADHESIONS;  Surgeon: Ena Dawley, MD;  Location: [facility];  Service: Gynecology;;  . LAPAROSCOPY N/A 01/10/2014   Procedure: LAPAROSCOPY OPERATIVE;  Surgeon: Ena Dawley, MD;  Location: [facility];  Service: Gynecology;  Laterality: N/A;  . TUBAL LIGATION    . UMBILICAL HERNIA REPAIR  01/10/2014   Procedure: HERNIA REPAIR UMBILICAL ADULT;  Surgeon: Ena Dawley, MD;  Location: [facility];  Service: Gynecology;;    Family History  Problem Relation Age of Onset  . Diabetes Mother   . Heart failure Mother   . Heart failure Father   . Diabetes Father   . Heart failure Brother   . Diabetes Brother   . Colon cancer Unknown        neg hx.  . Diabetes Maternal Grandmother   . Alzheimer's disease  Paternal Grandmother     Social History   Tobacco Use  . Smoking status: Never Smoker  . Smokeless tobacco: Never Used  Substance Use Topics  . Alcohol use: No    Alcohol/week: 0.0 oz  . Drug use: No    Prior to Admission medications   Medication Sig Start Date End Date Taking? Authorizing Provider  elvitegravir-cobicistat-emtricitabine-tenofovir (GENVOYA) 150-150-200-10 MG TABS tablet Take 1 tablet by mouth daily with breakfast. 09/07/17   Campbell Riches, MD  glipiZIDE (GLUCOTROL) 10 MG tablet Take 10 mg by mouth daily.    [provider]  hydrOXYzine (ATARAX/VISTARIL) 25 MG tablet Take 25 mg by mouth at bedtime. 11/05/16   [provider]  ibuprofen (ADVIL,MOTRIN) 800 MG tablet Take 800 mg by mouth daily as needed for pain. 10/08/16   [provider]  Insulin Glargine (TOUJEO SOLOSTAR [place]) Inject 50 Units into the skin every morning.     [provider]  lisinopril-hydrochlorothiazide (PRINZIDE,ZESTORETIC) 20-25 MG tablet Take 1 tablet by mouth daily. 04/07/16   [provider]  LYRICA 75 MG capsule Take 75 mg by mouth 2 (two) times daily.  11/05/16   [provider]  metFORMIN (GLUMETZA) 1000 MG (MOD) 24 hr tablet Take 1 tablet (1,000 mg total) by mouth 2 (two) times daily. 08/17/16 05/05/17  Nafziger, Tommi Rumps, NP  methocarbamol (ROBAXIN) 500 MG tablet Take 1 tablet (500 mg total) by mouth every 8 (eight) hours as needed for muscle spasms. 11/13/16   Jola Schmidt, MD  ondansetron (ZOFRAN-ODT) 8 MG disintegrating tablet Take 8 mg by mouth every 8 (eight) hours as needed for nausea/vomiting.    [provider]  Oxycodone HCl 10 MG TABS Take 10 mg by mouth 2 (two) times daily. 04/08/17   [provider]  promethazine (PHENERGAN) 25 MG tablet Take 25 mg by mouth every 6 (six) hours as needed for nausea/vomiting. 06/11/16   [provider]  traZODone (DESYREL) 100 MG tablet Take 100 mg by mouth at bedtime. 11/05/16   [provider]  VOLTAREN 1 % GEL Apply 2 g topically daily as needed (for knee pain).  08/03/16   [provider]  zolpidem (AMBIEN) 10 MG tablet Take 10 mg by mouth at bedtime as needed for sleep. 06/11/16   [provider]    Allergies Cortisone; Hydrocodone-acetaminophen; and Morphine   REVIEW OF SYSTEMS  Negative except as noted here or in the History of Present Illness.   PHYSICAL EXAMINATION  Initial Vital Signs Blood pressure 123/79, pulse 90, temperature 97.9 F (36.6 C), temperature source Oral, resp. rate 18, height 5' 8.5" (1.74 m), weight 103.4 kg (228 lb), SpO2 96 %.  Examination General: Well-developed, well-nourished female in no acute distress; appearance consistent with age of record HENT: normocephalic; atraumatic Eyes: pupils equal, round and reactive to light; extraocular muscles intact Neck: supple Heart: regular rate and rhythm Lungs: clear to auscultation bilaterally Abdomen: soft; nondistended; nontender; bowel sounds present GU: Tanner V female; vulvovaginal irritation; green, curd-like vaginal discharge; no vaginal bleeding Extremities: No deformity; full range of motion; pulses normal Neurologic: Awake, alert and oriented; motor function intact in all extremities and symmetric; no facial droop Skin: Warm and dry Psychiatric: Normal mood and affect   RESULTS  Summary of this visit's results, reviewed by myself:   EKG Interpretation  Date/Time:    Ventricular Rate:    PR Interval:    QRS Duration:   QT Interval:    QTC Calculation:   R Axis:     Text Interpretation:        Laboratory Studies: Results for orders placed or performed during the hospital encounter of 10/06/17 (from the past 24 hour(s))  Wet prep, genital     Status: Abnormal   Collection Time: 10/06/17  2:41 AM  Result Value Ref Range   Yeast Wet Prep HPF POC PRESENT (A) NONE SEEN   Trich, Wet Prep NONE SEEN NONE SEEN   Clue Cells Wet Prep HPF POC NONE SEEN  NONE SEEN   WBC, Wet Prep HPF POC MANY (A) NONE SEEN   Sperm NONE SEEN   Urinalysis, Routine w reflex microscopic     Status: Abnormal   Collection Time: 10/06/17  2:59 AM  Result Value Ref Range   Color, Urine YELLOW YELLOW   APPearance CLEAR CLEAR   Specific Gravity, Urine 1.015 1.005 - 1.030   pH 5.5 5.0 - 8.0   Glucose, UA >=500 (A) NEGATIVE mg/dL   Hgb urine dipstick TRACE (A) NEGATIVE   Bilirubin Urine NEGATIVE NEGATIVE   Ketones, ur NEGATIVE NEGATIVE mg/dL   Protein, ur NEGATIVE NEGATIVE mg/dL   Nitrite NEGATIVE NEGATIVE   Leukocytes, UA NEGATIVE NEGATIVE  Pregnancy, urine     Status: None   Collection Time: 10/06/17  2:59 AM  Result Value Ref Range  Preg Test, Ur NEGATIVE NEGATIVE  Urinalysis, Microscopic (reflex)     Status: Abnormal   Collection Time: 10/06/17  2:59 AM  Result Value Ref Range   RBC / HPF 0-5 0 - 5 RBC/hpf   WBC, UA 0-5 0 - 5 WBC/hpf   Bacteria, UA RARE (A) NONE SEEN   Squamous Epithelial / LPF 0-5 0 - 5   Imaging Studies: No results found.  ED COURSE and MDM  Nursing notes and initial vitals signs, including pulse oximetry, reviewed.  Vitals:   10/06/17 0223 10/06/17 0224 10/06/17 0225  BP:   123/79  Pulse: 90    Resp: 18    Temp: 97.9 F (36.6 C)    TempSrc: Oral    SpO2: 96%    Weight:  103.4 kg (228 lb)   Height:  5' 8.5" (1.74 m)     PROCEDURES    ED DIAGNOSES     ICD-10-CM   1. Candidal vulvovaginitis B37.3        Josefa Syracuse, MD 10/06/17 [phone]

## 2017-10-19 DIAGNOSIS — E1165 Type 2 diabetes mellitus with hyperglycemia: Secondary | ICD-10-CM | POA: Diagnosis not present

## 2017-10-19 DIAGNOSIS — M545 Low back pain: Secondary | ICD-10-CM | POA: Diagnosis not present

## 2017-10-19 DIAGNOSIS — Z79899 Other long term (current) drug therapy: Secondary | ICD-10-CM | POA: Diagnosis not present

## 2017-10-19 DIAGNOSIS — R351 Nocturia: Secondary | ICD-10-CM | POA: Diagnosis not present

## 2017-10-20 DIAGNOSIS — G4700 Insomnia, unspecified: Secondary | ICD-10-CM | POA: Diagnosis not present

## 2017-10-20 DIAGNOSIS — F411 Generalized anxiety disorder: Secondary | ICD-10-CM | POA: Diagnosis not present

## 2017-10-25 DIAGNOSIS — Z21 Asymptomatic human immunodeficiency virus [HIV] infection status: Secondary | ICD-10-CM | POA: Diagnosis not present

## 2017-10-25 DIAGNOSIS — G43109 Migraine with aura, not intractable, without status migrainosus: Secondary | ICD-10-CM | POA: Diagnosis not present

## 2017-10-25 DIAGNOSIS — E1165 Type 2 diabetes mellitus with hyperglycemia: Secondary | ICD-10-CM | POA: Diagnosis not present

## 2017-10-25 DIAGNOSIS — R110 Nausea: Secondary | ICD-10-CM | POA: Diagnosis not present

## 2017-10-27 ENCOUNTER — Ambulatory Visit: Payer: Self-pay | Admitting: Infectious Diseases

## 2017-11-23 ENCOUNTER — Ambulatory Visit: Payer: Self-pay | Admitting: Internal Medicine

## 2018-01-02 ENCOUNTER — Ambulatory Visit (INDEPENDENT_AMBULATORY_CARE_PROVIDER_SITE_OTHER): Payer: Medicare Other | Admitting: Pharmacist

## 2018-01-02 DIAGNOSIS — Z7189 Other specified counseling: Secondary | ICD-10-CM

## 2018-01-02 DIAGNOSIS — Z0000 Encounter for general adult medical examination without abnormal findings: Secondary | ICD-10-CM | POA: Diagnosis not present

## 2018-01-02 DIAGNOSIS — B20 Human immunodeficiency virus [HIV] disease: Secondary | ICD-10-CM

## 2018-01-02 MED ORDER — DARUN-COBIC-EMTRICIT-TENOFAF 800-150-200-10 MG PO TABS: 1.0000 | ORAL_TABLET | Freq: Every day | ORAL | 2 refills | Status: DC

## 2018-01-02 MED ORDER — DARUN-COBIC-EMTRICIT-TENOFAF 800-150-200-10 MG PO TABS: 1.0000 | ORAL_TABLET | Freq: Every day | ORAL | 5 refills | Status: DC

## 2018-01-02 MED FILL — SYMTUZA 800-150-200-10 MG T: 800-150-200 | 30 days supply | Qty: 30 | Fill #0

## 2018-01-02 NOTE — Progress Notes (Signed)
HPI: Brenda [name] is a 47 y.o. female who presents to the [place] clinic today to follow-up for her HIV infection.  She has not been seen by Dr. Johnnye Sima since May 2017.  Patient Active Problem List   Diagnosis Date Noted  . Iron deficiency anemia due to chronic blood loss - hemorhoids and menses 09/21/2016  . Prolapsed internal hemorrhoids, grade 3 - bleeding 09/21/2016  . Syncope 08/14/2015  . Rash and nonspecific skin eruption 06/30/2011  . Obesity   . MENORRHAGIA 09/04/2008  . Insulin dependent type 2 diabetes mellitus, uncontrolled ([place]) 02/20/2007  . Human immunodeficiency virus (HIV) disease ([place]) 03/11/2006  . 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 Migraine headache 03/11/2006  . FOLLICULITIS 54/65/0354    Patient's Medications  New Prescriptions   DARUNAVIR-COBICISCTAT-EMTRICITABINE-TENOFOVIR ALAFENAMIDE (SYMTUZA) 800-150-200-10 MG TABS    Take 1 tablet by mouth daily with breakfast.  Previous Medications   IBUPROFEN (ADVIL,MOTRIN) 800 MG TABLET    Take 800 mg by mouth daily as needed for pain.   INSULIN GLARGINE (TOUJEO SOLOSTAR [place])    Inject 50 Units into the skin every morning.    LISINOPRIL-HYDROCHLOROTHIAZIDE (PRINZIDE,ZESTORETIC) 20-25 MG TABLET    Take 1 tablet by mouth daily.   METFORMIN (GLUMETZA) 1000 MG (MOD) 24 HR TABLET    Take 1 tablet (1,000 mg total) by mouth 2 (two) times daily.   OXYCODONE HCL 10 MG TABS    Take 10 mg by mouth 2 (two) times daily.   PROMETHAZINE (PHENERGAN) 25 MG TABLET    Take 25 mg by mouth every 6 (six) hours as needed for nausea/vomiting.   SERTRALINE (ZOLOFT) 100 MG TABLET    Take 100 mg by mouth at bedtime.   TRAZODONE (DESYREL) 100 MG TABLET    Take 100 mg by mouth at bedtime.   VOLTAREN 1 % GEL    Apply 2 g topically daily as needed (for knee pain).    ZOLPIDEM (AMBIEN) 10 MG TABLET    Take 10 mg by mouth at bedtime as needed for sleep.  Modified Medications   No medications on file  Discontinued Medications   ELVITEGRAVIR-COBICISTAT-EMTRICITABINE-TENOFOVIR (GENVOYA) 150-150-200-10 MG TABS TABLET    Take 1 tablet by mouth daily with breakfast.   FLUCONAZOLE (DIFLUCAN) 150 MG TABLET    Take on Sunday Oct 09, 2017 if symptoms persist.   GLIPIZIDE (GLUCOTROL) 10 MG TABLET    Take 10 mg by mouth daily.   HYDROXYZINE (ATARAX/VISTARIL) 25 MG TABLET    Take 25 mg by mouth at bedtime.   LYRICA 75 MG CAPSULE    Take 75 mg by mouth 2 (two) times daily.    METHOCARBAMOL (ROBAXIN) 500 MG TABLET    Take 1 tablet (500 mg total) by mouth every 8 (eight) hours as needed for muscle spasms.   ONDANSETRON (ZOFRAN-ODT) 8 MG DISINTEGRATING TABLET    Take 8 mg by mouth every 8 (eight) hours as needed for nausea/vomiting.    Allergies: Allergies  Allergen Reactions  . Cortisone Itching    REACTION: "nerves"  . Hydrocodone-Acetaminophen Itching    REACTION: "nerves"  . Morphine Itching    REACTION: "nerves"    Past Medical History: Past Medical History:  Diagnosis Date  . Arthritis    knees  . Chronic folliculitis   . Diabetes mellitus   . Hemorrhoids   . HIV disease ([place])   . Hypertension   . Iron deficiency anemia due to chronic blood loss   . Irregular menstrual cycle   .  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 Migraines   . Obesity   . Pneumothorax, traumatic     Social History: Social History   Socioeconomic History  . Marital status: Single    Spouse name: Not on file  . Number of children: 4  . Years of education: Not on file  . Highest education level: Not on file  Occupational History  . Occupation: [employer]  Social Needs  . Financial resource strain: Not on file  . Food insecurity:    Worry: Not on file    Inability: Not on file  . Transportation needs:    Medical: Not on file    Non-medical: Not on file  Tobacco Use  . Smoking status: Never Smoker  . Smokeless tobacco: Never Used  Substance and Sexual Activity  . Alcohol use: No    Alcohol/week: 0.0 oz  . Drug use: No    . Sexual activity: Never    Partners: Female    Birth control/protection: Condom    Comment: refuses condoms/ BTL   Lifestyle  . Physical activity:    Days per week: Not on file    Minutes per session: Not on file  . Stress: Not on file  Relationships  . Social connections:    Talks on phone: Not on file    Gets together: Not on file    Attends religious service: Not on file    Active member of club or organization: Not on file    Attends meetings of clubs or organizations: Not on file    Relationship status: Not on file  Other Topics Concern  . Not on file  Social History Narrative   Daily caffeine: 1 coffee/day   4 children, 3 at home youngest born 2006   2 grandchildren    Employed: Painting and Scientist, forensic: [employer]: Lab Results  Component Value Date   HIV1RNAQUANT <20 DETECTED (A) 06/21/2017   HIV1RNAQUANT <20 NOT DETECTED 10/14/2016   HIV1RNAQUANT <20 07/30/2015   CD4TABS 610 06/21/2017   CD4TABS 670 10/14/2016   CD4TABS 570 07/30/2015    RPR and STI Lab Results  Component Value Date   LABRPR NON REAC 10/14/2016   LABRPR NON REAC 07/30/2015   LABRPR NON REAC 05/29/2015   LABRPR NON REAC 10/23/2013   LABRPR NON REAC 11/20/2012    STI Results GC GC CT CT  Latest Ref Rng & Units - NEGATIVE - NEGATIVE  10/06/2017 Negative - Negative -  07/30/2015 Negative - Negative -  12/22/2013 - NEGATIVE - NEGATIVE    Hepatitis B Lab Results  Component Value Date   HEPBSAB NEG 06/11/2014   HEPBSAG No 08/01/2006   Hepatitis C No results found for: HEPCAB, HCVRNAPCRQN Hepatitis A No results found for: HAV Lipids: Lab Results  Component Value Date   CHOL 207 (H) 10/14/2016   TRIG 138 10/14/2016   HDL 51 10/14/2016   CHOLHDL 4.1 10/14/2016   VLDL 28 10/14/2016   LDLCALC 128 (H) 10/14/2016    Current HIV Regimen: Genvoya  Assessment: Brenda [name] comes in today to see me for her HIV infection.  She has been out of care for  over 2 years and last saw Dr. Johnnye Sima in May of 2017.  She comes in today very tired and lethargic and states that she hasn't been very good at taking her Genvoya.  She states she has missed multiple  doses throughout the last couple of months and has been taking it on and off.  She has been very depressed lately and is seeing a psychiatrist.  She states that they are trying to cancel her SSI and she is having trouble paying for things and is worried about having money in the future.  She is also having trouble sleeping and eating.  She has been driving back and forth a couple of hours away to see/take care of her father that has had several strokes lately. She told me that she hasn't followed up because she has been busy and forgets her afternoon appointments because she has to pick up her kids.  I made an appointment for her to see Rollene Fare next week for counseling.   She has been having some issues as stated above.  She also has felt dizzy lately and nauseated.  No headaches, night sweats, SOB, or CP.  She has been forgetting things lately and is worried about early onset dementia.  I told her she definitely needed to see a MD very soon.   I will change her to [facility] today for a higher barrier to resistance.  I'm afraid she has developed resistance to the components of Genvoya due to her spotty adherence. Will check today.  I will also have [facility] start filling her Symtuza so we can keep better track of her. Will check all HIV labs today. She refused a urine cytology to check for gc/c. She states she has not been sexually active. I will have her follow-up with Dr. Johnnye Sima next month and encouraged her make the appointment.  Plan: - Stop Genvoya - Start Symtuza PO once daily - Fill at [facility] - HIV RNA with reflex to resistance, CD4, RPR, CMET, CBC, lipid panel today - F/u with Rollene Fare for counseling 8/7 at 10am - F/u with Dr. Johnnye Sima 8/29 at 915am  Brena Windsor L. Jamina Macbeth, PharmD, [place], [place] [facility] 01/02/2018, 2:47 PM

## 2018-01-03 LAB — CBC
HEMATOCRIT: 32.5 % — AB (ref 35.0–45.0)
Hemoglobin: 10.2 g/dL — ABNORMAL LOW (ref 11.7–15.5)
MCH: 23.9 pg — ABNORMAL LOW (ref 27.0–33.0)
MCHC: 31.4 g/dL — ABNORMAL LOW (ref 32.0–36.0)
MCV: 76.1 fL — ABNORMAL LOW (ref 80.0–100.0)
MPV: 9.9 fL (ref 7.5–12.5)
Platelets: 422 10*3/uL — ABNORMAL HIGH (ref 140–400)
RBC: 4.27 10*6/uL (ref 3.80–5.10)
RDW: 14.9 % (ref 11.0–15.0)
WBC: 6 10*3/uL (ref 3.8–10.8)

## 2018-01-03 LAB — COMPREHENSIVE METABOLIC PANEL
AG Ratio: 1.4 (calc) (ref 1.0–2.5)
ALKALINE PHOSPHATASE (APISO): 86 U/L (ref 33–115)
ALT: 7 U/L (ref 6–29)
AST: 13 U/L (ref 10–35)
Albumin: 4.2 g/dL (ref 3.6–5.1)
BUN: 11 mg/dL (ref 7–25)
CHLORIDE: 103 mmol/L (ref 98–110)
CO2: 26 mmol/L (ref 20–32)
CREATININE: 0.83 mg/dL (ref 0.50–1.10)
Calcium: 9.4 mg/dL (ref 8.6–10.2)
Globulin: 3.1 g/dL (calc) (ref 1.9–3.7)
Glucose, Bld: 163 mg/dL — ABNORMAL HIGH (ref 65–99)
Potassium: 3.5 mmol/L (ref 3.5–5.3)
Sodium: 139 mmol/L (ref 135–146)
Total Bilirubin: 0.3 mg/dL (ref 0.2–1.2)
Total Protein: 7.3 g/dL (ref 6.1–8.1)

## 2018-01-03 LAB — LIPID PANEL
Cholesterol: 231 mg/dL — ABNORMAL HIGH (ref <200)
HDL: 38 mg/dL — AB (ref >50)
LDL Cholesterol (Calc): 167 mg/dL (calc) — ABNORMAL HIGH
NON-HDL CHOLESTEROL (CALC): 193 mg/dL — AB (ref <130)
TRIGLYCERIDES: 128 mg/dL (ref <150)
Total CHOL/HDL Ratio: 6.1 (calc) — ABNORMAL HIGH (ref <5.0)

## 2018-01-03 LAB — RPR: RPR: NONREACTIVE

## 2018-01-03 LAB — T-HELPER CELL (CD4) - (RCID CLINIC ONLY)
CD4 T CELL HELPER: 31 % — AB (ref 33–55)
CD4 T Cell Abs: 500 /uL (ref 400–2700)

## 2018-01-11 ENCOUNTER — Ambulatory Visit (INDEPENDENT_AMBULATORY_CARE_PROVIDER_SITE_OTHER): Payer: Self-pay | Admitting: Licensed Clinical Social Worker

## 2018-01-11 DIAGNOSIS — F331 Major depressive disorder, recurrent, moderate: Secondary | ICD-10-CM

## 2018-01-11 NOTE — BH Specialist Note (Signed)
Integrated Behavioral Health Initial Visit  MRN: [id number] Name: Brenda [name]  Number of [place] Clinician visits:: 1/6 Session Start time: 10:08am  Session End time: 11:00am Total time: 50 minutes  Type of Service: [place] Interpretor:No. Interpretor Name and Language: n/a   SUBJECTIVE: Brenda [name] is a 47 y.o. female accompanied by self Patient self-referred  for stress and depression. Patient reports the following symptoms/concerns: difficulty sleeping, troubling thoughts, high stress levels, easy to anger, feeling overwhelmed, difficulty in family relationships, low motivation, little energy, sad/depressed mood, crying spells Duration of problem: 3 months Severity of problem: moderate  OBJECTIVE: Mood: Depressed and Affect: Blunt Risk of harm to self or others: No plan to harm self or others  LIFE CONTEXT: Patient lives with 3 of her 4 children, though one is about to move out after her maternity leave. Daughters are ages 22 (has one child and another on the way, due any time), 70 an 8. Son is 4. Only the 11 year old does not live in the home. Son's father has been calling patient and accusing her of taking the money son is to receive from [place] in [employer], but patient states she has never received any of that money. Patient does not work regularly, she has been on disability for many years, but this past year made enough money that her review was denied and she was deemed no longer disabled. This resulted in her no longer receiving Social Security Disability checks and losing her Medicaid. Mother is self-employed painting apartments and contracts with patient and some of her brothers to do the work for her. Patient's father lives in [place] and recently had a stroke, so patient has been driving back and forth regularly to help him out (she visits him, runs errands for him and cooks for him,  since her step-mother does not know how to cook in a way that is healthy for father's health situation. Although she has 9 brothers (8 are still living), patient reports being the only one to help take care of their dad. Patient does not speak to 2 of her brothers, but has a pretty good relationship with the others. However, she reports that both of her parents have favorite grandchildren, and do not spend time or energy with her children. Patient reports that her youngest brother's children seem to be preferred by both of her parents which bothers her and her kids.   GOALS ADDRESSED: Patient will: 1. Reduce symptoms of: depression   INTERVENTIONS: Interventions utilized: Motivational Interviewing, Brief CBT and Supportive Counseling    ASSESSMENT: Patient currently experiencing insomnia, depressed mood, anxiety, lack of energy and motivation, depressed mood and blunt affect, irritablity, increased sensitivity to emotional stimuli, anhedonia. 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 Counselor processed with patient her experiences with various family members and how they treat her. 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 Counselor pointed out that in each of these situaitons patient had not set a limit or boundary (I.e. Will not talk to mom about a contract because 'that's my mom") and therefore  allows the others in her life to take advantage of her lack of boundaries. Patient perseverates a bit on her frustration with mother for mother's treatment of her, but then also states  she plans to ask mom to help her with bill payments. She states that she lets mom know after the fact how upset and angry she is, and then stops talking to her. However, when patient needs mom's help and mom points this out to her, she gets angry. Counselor guided patient in looking at ways to set appropriate expectations of/with mom ahead of time, and ways to handle times that these expectations are not met. Patient seemed more interested in talking about her frustration with mother than setting up a way to change things. Counselor emphasized the importance of showing respect for herself by setting boundaries with others.    Patient may benefit from ongoing CBT and some DBT.   PLAN: 1. Follow up with behavioral health clinician in one week. Lillie Fragmin, LCSW

## 2018-01-18 ENCOUNTER — Institutional Professional Consult (permissible substitution): Payer: Self-pay | Admitting: Licensed Clinical Social Worker

## 2018-01-30 DIAGNOSIS — M545 Low back pain: Secondary | ICD-10-CM | POA: Diagnosis not present

## 2018-01-30 DIAGNOSIS — Z79899 Other long term (current) drug therapy: Secondary | ICD-10-CM | POA: Diagnosis not present

## 2018-02-01 ENCOUNTER — Encounter

## 2018-02-01 ENCOUNTER — Ambulatory Visit: Payer: Self-pay | Admitting: Internal Medicine

## 2018-02-01 DIAGNOSIS — F419 Anxiety disorder, unspecified: Secondary | ICD-10-CM | POA: Diagnosis not present

## 2018-02-01 DIAGNOSIS — F329 Major depressive disorder, single episode, unspecified: Secondary | ICD-10-CM | POA: Diagnosis not present

## 2018-02-01 DIAGNOSIS — G4700 Insomnia, unspecified: Secondary | ICD-10-CM | POA: Diagnosis not present

## 2018-02-02 ENCOUNTER — Other Ambulatory Visit: Payer: Self-pay | Admitting: Pharmacist

## 2018-02-02 ENCOUNTER — Encounter: Payer: Self-pay | Admitting: Infectious Diseases

## 2018-02-02 ENCOUNTER — Ambulatory Visit: Payer: Self-pay | Admitting: Infectious Diseases

## 2018-02-02 ENCOUNTER — Ambulatory Visit (INDEPENDENT_AMBULATORY_CARE_PROVIDER_SITE_OTHER): Payer: Medicare Other | Admitting: Infectious Diseases

## 2018-02-02 VITALS — BP 118/74 | HR 87 | Temp 98.0°F | Wt 220.0 lb

## 2018-02-02 DIAGNOSIS — Z113 Encounter for screening for infections with a predominantly sexual mode of transmission: Secondary | ICD-10-CM

## 2018-02-02 DIAGNOSIS — E1165 Type 2 diabetes mellitus with hyperglycemia: Secondary | ICD-10-CM | POA: Diagnosis not present

## 2018-02-02 DIAGNOSIS — IMO0002 Reserved for concepts with insufficient information to code with codable children: Secondary | ICD-10-CM

## 2018-02-02 DIAGNOSIS — R197 Diarrhea, unspecified: Secondary | ICD-10-CM | POA: Diagnosis not present

## 2018-02-02 DIAGNOSIS — B20 Human immunodeficiency virus [HIV] disease: Secondary | ICD-10-CM

## 2018-02-02 DIAGNOSIS — R21 Rash and other nonspecific skin eruption: Secondary | ICD-10-CM

## 2018-02-02 DIAGNOSIS — F329 Major depressive disorder, single episode, unspecified: Secondary | ICD-10-CM

## 2018-02-02 DIAGNOSIS — Z794 Long term (current) use of insulin: Secondary | ICD-10-CM | POA: Diagnosis not present

## 2018-02-02 DIAGNOSIS — F32A Depression, unspecified: Secondary | ICD-10-CM

## 2018-02-02 MED ORDER — DARUN-COBIC-EMTRICIT-TENOFAF 800-150-200-10 MG PO TABS: 1.0000 | ORAL_TABLET | Freq: Every day | ORAL | 5 refills | Status: DC

## 2018-02-02 MED FILL — SYMTUZA 800-150-200-10 MG T: 800-150-200 | 30 days supply | Qty: 30 | Fill #1

## 2018-02-02 NOTE — Assessment & Plan Note (Signed)
She is doing well from HIV standpoint. CD4 steady.  Offered PCv but she refers Offered/refused condoms. Has them at home.  Will rtc in 6 months

## 2018-02-02 NOTE — Assessment & Plan Note (Signed)
She clarifies that this is "mushy soft" today, watery yesterday.  She does not need to move bowels currently, so unable to send stool studies.

## 2018-02-02 NOTE — Progress Notes (Signed)
   Subjective:    Patient ID: Brenda [name], female    DOB: 06/22/1970, 47 y.o.   MRN: [id number]  HPI 47 yo F with hx of HIV+, DM2 (on insulin). Since last visit 10-2015 she has had multiple ED visits (vaginal candidiasis, headaches) and GI eval for anemia ( had hemorrhoid banded as well as given feraheme).  Has been having diarrhea for 1 week. Denies sick exposures, no f/c.  Has had tingling in her R breast for 24 h. No skin lesions.  Has had pap and mammo done regularly. Done at Dr Marchelle Gearing office.  Has GI f/u appt tomorrow.   See ophtho yearly.   Taking genvoya without problems.   Glc has been "up and down". Was 134 yesterday.   HIV 1 RNA Quant (copies/mL)  Date Value  06/21/2017 <20 DETECTED (A)  10/14/2016 <20 NOT DETECTED  07/30/2015 <20   CD4 T Cell Abs (/uL)  Date Value  01/02/2018 500  06/21/2017 610  10/14/2016 670    Review of Systems  Constitutional: Positive for appetite change, fatigue and unexpected weight change. Negative for chills and fever.  Gastrointestinal: Positive for abdominal pain and diarrhea. Negative for constipation.  Genitourinary: Negative for difficulty urinating.  Psychiatric/Behavioral: Positive for dysphoric mood.  Please see HPI. All other systems reviewed and negative.     Objective:   Physical Exam  Constitutional: She appears well-developed and well-nourished.  HENT:  Nose: Mucosal edema and sinus tenderness present.  No foreign bodies.  Mouth/Throat: No oropharyngeal exudate.  Small pimple in R nares. Erythema.   Eyes: Pupils are equal, round, and reactive to light. EOM are normal.  Neck: Normal range of motion. Neck supple.  Cardiovascular: Normal rate, regular rhythm and normal heart sounds.  Pulmonary/Chest: Effort normal and breath sounds normal.  Abdominal: Soft. Bowel sounds are normal. She exhibits no distension. There is no tenderness.  Musculoskeletal: Normal range of motion. She exhibits edema.    Lymphadenopathy:    She has no cervical adenopathy.  Psychiatric: She exhibits a depressed mood.       Assessment & Plan:

## 2018-02-02 NOTE — Assessment & Plan Note (Signed)
She is seeing a Social worker as well as seeing the ID clinic counselor.

## 2018-02-02 NOTE — Assessment & Plan Note (Signed)
I don't see any evidence of zoster (which could match her sx) Have asked her to f/u with GYN about her yearly mammo (for her breat itching, burning).

## 2018-02-02 NOTE — Assessment & Plan Note (Signed)
She will f/u with PCP.  Suspect this as cause of wt loss and fatigue.

## 2018-02-03 ENCOUNTER — Ambulatory Visit: Payer: Self-pay | Admitting: Internal Medicine

## 2018-02-27 DIAGNOSIS — K5732 Diverticulitis of large intestine without perforation or abscess without bleeding: Secondary | ICD-10-CM | POA: Diagnosis not present

## 2018-02-27 DIAGNOSIS — R1084 Generalized abdominal pain: Secondary | ICD-10-CM | POA: Diagnosis not present

## 2018-03-01 ENCOUNTER — Other Ambulatory Visit: Payer: Self-pay | Admitting: Infectious Diseases

## 2018-03-01 DIAGNOSIS — F329 Major depressive disorder, single episode, unspecified: Secondary | ICD-10-CM | POA: Diagnosis not present

## 2018-03-01 DIAGNOSIS — Z79899 Other long term (current) drug therapy: Secondary | ICD-10-CM | POA: Diagnosis not present

## 2018-03-01 DIAGNOSIS — B20 Human immunodeficiency virus [HIV] disease: Secondary | ICD-10-CM

## 2018-03-01 DIAGNOSIS — G4700 Insomnia, unspecified: Secondary | ICD-10-CM | POA: Diagnosis not present

## 2018-03-01 DIAGNOSIS — F419 Anxiety disorder, unspecified: Secondary | ICD-10-CM | POA: Diagnosis not present

## 2018-03-02 DIAGNOSIS — Z79899 Other long term (current) drug therapy: Secondary | ICD-10-CM | POA: Diagnosis not present

## 2018-03-02 DIAGNOSIS — M25561 Pain in right knee: Secondary | ICD-10-CM | POA: Diagnosis not present

## 2018-03-02 DIAGNOSIS — M545 Low back pain: Secondary | ICD-10-CM | POA: Diagnosis not present

## 2018-03-02 DIAGNOSIS — M25511 Pain in right shoulder: Secondary | ICD-10-CM | POA: Diagnosis not present

## 2018-03-02 DIAGNOSIS — E118 Type 2 diabetes mellitus with unspecified complications: Secondary | ICD-10-CM | POA: Diagnosis not present

## 2018-03-02 DIAGNOSIS — R3989 Other symptoms and signs involving the genitourinary system: Secondary | ICD-10-CM | POA: Diagnosis not present

## 2018-03-02 DIAGNOSIS — R1084 Generalized abdominal pain: Secondary | ICD-10-CM | POA: Diagnosis not present

## 2018-03-09 DIAGNOSIS — R1084 Generalized abdominal pain: Secondary | ICD-10-CM | POA: Diagnosis not present

## 2018-03-13 ENCOUNTER — Telehealth: Payer: Self-pay | Admitting: Pharmacist

## 2018-03-13 DIAGNOSIS — B373 Candidiasis of vulva and vagina: Secondary | ICD-10-CM | POA: Diagnosis not present

## 2018-03-13 DIAGNOSIS — B20 Human immunodeficiency virus [HIV] disease: Secondary | ICD-10-CM

## 2018-03-13 DIAGNOSIS — R1084 Generalized abdominal pain: Secondary | ICD-10-CM | POA: Diagnosis not present

## 2018-03-13 MED ORDER — ELVITEG-COBIC-EMTRICIT-TENOFAF 150-150-200-10 MG PO TABS: 1.0000 | ORAL_TABLET | Freq: Every day | ORAL | 8 refills | Status: DC

## 2018-03-13 NOTE — Telephone Encounter (Signed)
Patient stated that she has stopped Symtuza due to side effects and how it made her feel.  She wishes to go back to [facility] and has been using a supply that she had left over from the switch.  Will send in [facility] for her to restart.

## 2018-03-14 ENCOUNTER — Telehealth: Payer: Self-pay

## 2018-03-14 MED FILL — GENVOYA TABLET: 150-150-200 | 30 days supply | Qty: 30 | Fill #0

## 2018-03-16 ENCOUNTER — Other Ambulatory Visit: Payer: Self-pay | Admitting: Obstetrics and Gynecology

## 2018-03-16 DIAGNOSIS — G4700 Insomnia, unspecified: Secondary | ICD-10-CM | POA: Diagnosis not present

## 2018-03-16 DIAGNOSIS — Z6834 Body mass index (BMI) 34.0-34.9, adult: Secondary | ICD-10-CM | POA: Diagnosis not present

## 2018-03-16 DIAGNOSIS — Z113 Encounter for screening for infections with a predominantly sexual mode of transmission: Secondary | ICD-10-CM | POA: Diagnosis not present

## 2018-03-16 DIAGNOSIS — N950 Postmenopausal bleeding: Secondary | ICD-10-CM | POA: Diagnosis not present

## 2018-03-16 DIAGNOSIS — Z124 Encounter for screening for malignant neoplasm of cervix: Secondary | ICD-10-CM | POA: Diagnosis not present

## 2018-03-16 DIAGNOSIS — F419 Anxiety disorder, unspecified: Secondary | ICD-10-CM | POA: Diagnosis not present

## 2018-03-16 DIAGNOSIS — N644 Mastodynia: Secondary | ICD-10-CM

## 2018-03-16 DIAGNOSIS — L293 Anogenital pruritus, unspecified: Secondary | ICD-10-CM | POA: Diagnosis not present

## 2018-03-16 DIAGNOSIS — F329 Major depressive disorder, single episode, unspecified: Secondary | ICD-10-CM | POA: Diagnosis not present

## 2018-03-16 DIAGNOSIS — Z01411 Encounter for gynecological examination (general) (routine) with abnormal findings: Secondary | ICD-10-CM | POA: Diagnosis not present

## 2018-03-21 ENCOUNTER — Ambulatory Visit: Payer: Self-pay | Admitting: Internal Medicine

## 2018-03-22 ENCOUNTER — Ambulatory Visit
Admission: RE | Admit: 2018-03-22 | Discharge: 2018-03-22 | Disposition: A | Discharge status: Home / Self Care | Payer: Medicare Other | Source: Ambulatory Visit | Attending: Obstetrics and Gynecology | Admitting: Obstetrics and Gynecology

## 2018-03-22 ENCOUNTER — Ambulatory Visit: Payer: Self-pay

## 2018-03-22 DIAGNOSIS — R928 Other abnormal and inconclusive findings on diagnostic imaging of breast: Secondary | ICD-10-CM | POA: Diagnosis not present

## 2018-03-22 DIAGNOSIS — M25561 Pain in right knee: Secondary | ICD-10-CM | POA: Diagnosis not present

## 2018-03-22 DIAGNOSIS — M25461 Effusion, right knee: Secondary | ICD-10-CM | POA: Diagnosis not present

## 2018-03-22 DIAGNOSIS — M25511 Pain in right shoulder: Secondary | ICD-10-CM | POA: Diagnosis not present

## 2018-03-22 DIAGNOSIS — N644 Mastodynia: Secondary | ICD-10-CM

## 2018-03-22 DIAGNOSIS — Z79899 Other long term (current) drug therapy: Secondary | ICD-10-CM | POA: Diagnosis not present

## 2018-03-30 ENCOUNTER — Other Ambulatory Visit: Payer: Self-pay | Admitting: Obstetrics and Gynecology

## 2018-03-31 NOTE — Patient Instructions (Addendum)
Your procedure is scheduled on:  Wednesday, Nov 6 at 9:30 am  Enter through the [employer] [facility] at: 8 am  Pick up the phone at the desk and dial [phone].  Call this number if you have problems the morning of surgery: [phone].  Remember: Do NOT eat food or Do NOT drink clear liquids (including water) after midnight Tuesday  Take these medicines the morning of surgery with a SIP OF WATER: Genvoya and klonopin if needed  Do not take any diabetes medication on the day of surgery.  We will check your blood sugar upon arrival and treat if needed.    Brush your teeth on the day of surgery.  Stop herbal medications, vitamin supplements, Ibuprofen/NSAIDS at this time.  Do NOT wear jewelry (body piercing), metal hair clips/bobby pins, make-up, or nail polish. Do NOT wear lotions, powders, or perfumes.  You may wear deoderant. Do NOT shave for 48 hours prior to surgery. Do NOT bring valuables to the hospital.  Have a responsible adult drive you home and stay with you for 24 hours after your procedure.  Home with Brenda [name] cell [phone].

## 2018-04-05 ENCOUNTER — Encounter (HOSPITAL_COMMUNITY)
Admission: RE | Admit: 2018-04-05 | Discharge: 2018-04-05 | Disposition: A | Discharge status: Home / Self Care | Payer: Medicare Other | Source: Ambulatory Visit | Attending: Obstetrics and Gynecology | Admitting: Obstetrics and Gynecology

## 2018-04-05 ENCOUNTER — Other Ambulatory Visit: Payer: Self-pay

## 2018-04-05 ENCOUNTER — Encounter (HOSPITAL_COMMUNITY): Payer: Self-pay

## 2018-04-05 DIAGNOSIS — B20 Human immunodeficiency virus [HIV] disease: Secondary | ICD-10-CM | POA: Insufficient documentation

## 2018-04-05 DIAGNOSIS — Z01818 Encounter for other preprocedural examination: Secondary | ICD-10-CM | POA: Diagnosis not present

## 2018-04-05 HISTORY — DX: Anxiety disorder, unspecified: F41.9

## 2018-04-05 HISTORY — DX: Depression, unspecified: F32.A

## 2018-04-05 HISTORY — DX: Personal history of other medical treatment: Z92.89

## 2018-04-05 HISTORY — DX: Major depressive disorder, single episode, unspecified: F32.9

## 2018-04-05 LAB — BASIC METABOLIC PANEL
Anion gap: 9 (ref 5–15)
BUN: 14 mg/dL (ref 6–20)
CALCIUM: 8.7 mg/dL — AB (ref 8.9–10.3)
CO2: 26 mmol/L (ref 22–32)
CREATININE: 0.94 mg/dL (ref 0.44–1.00)
Chloride: 104 mmol/L (ref 98–111)
GFR calc non Af Amer: >60 mL/min (ref >60)
Glucose, Bld: 182 mg/dL — ABNORMAL HIGH (ref 70–99)
Potassium: 3.7 mmol/L (ref 3.5–5.1)
SODIUM: 139 mmol/L (ref 135–145)

## 2018-04-05 LAB — CBC
HCT: 28.7 % — ABNORMAL LOW (ref 36.0–46.0)
Hemoglobin: 8.8 g/dL — ABNORMAL LOW (ref 12.0–15.0)
MCH: 23.5 pg — ABNORMAL LOW (ref 26.0–34.0)
MCHC: 30.7 g/dL (ref 30.0–36.0)
MCV: 76.5 fL — ABNORMAL LOW (ref 80.0–100.0)
Platelets: 409 10*3/uL — ABNORMAL HIGH (ref 150–400)
RBC: 3.75 MIL/uL — ABNORMAL LOW (ref 3.87–5.11)
RDW: 14.5 % (ref 11.5–15.5)
WBC: 5.5 10*3/uL (ref 4.0–10.5)
nRBC: 0 % (ref 0.0–0.2)

## 2018-04-05 NOTE — Pre-Procedure Instructions (Signed)
SDS BB History Log given to lab for patient's previous blood transfusion at [facility].

## 2018-04-06 DIAGNOSIS — I10 Essential (primary) hypertension: Secondary | ICD-10-CM | POA: Diagnosis not present

## 2018-04-06 DIAGNOSIS — G43009 Migraine without aura, not intractable, without status migrainosus: Secondary | ICD-10-CM | POA: Diagnosis not present

## 2018-04-06 DIAGNOSIS — E1165 Type 2 diabetes mellitus with hyperglycemia: Secondary | ICD-10-CM | POA: Diagnosis not present

## 2018-04-06 DIAGNOSIS — D539 Nutritional anemia, unspecified: Secondary | ICD-10-CM | POA: Diagnosis not present

## 2018-04-07 MED FILL — GENVOYA TABLET: 150-150-200 | 30 days supply | Qty: 30 | Fill #1

## 2018-04-11 ENCOUNTER — Encounter (HOSPITAL_COMMUNITY): Payer: Self-pay | Admitting: Anesthesiology

## 2018-04-11 NOTE — H&P (Signed)
Admission History and Physical Exam for a Gynecology Patient  [name] is a 47 y.o. female, [phone], who presents for hysteroscopy and dilation and curettage for postmenopausal bleeding. She has been followed at the [facility]ne[facility] division of [facility].  OB History    Gravida  8   Para  4   Term  4   Preterm      AB  4   Living  4     SAB  4   TAB      Ectopic      Multiple      Live Births              Past Medical History:  Diagnosis Date  . Anxiety   . Arthritis    knees  . Chronic folliculitis   . Depression   . Diabetes mellitus    type 2  . Hemorrhoids   . History of blood transfusion    MC   . HIV disease ([place])   . Hypertension   . Iron deficiency anemia due to chronic blood loss   . Irregular menstrual cycle   . 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 Migraines   . Obesity   . Pneumothorax, traumatic   . SVD (spontaneous vaginal delivery)    x 4    Medications:  Insulin and clonazepam  Past Surgical History:  Procedure Laterality Date  . CHEST TUBE INSERTION     and removal of chest tube  . COLONOSCOPY    . HEMORRHOID BANDING    . HYSTEROSCOPY W/D&C  Removal of Endometrial Polyp   Dr. Raphael Gibney 2011  . LAPAROSCOPIC LYSIS OF ADHESIONS  01/10/2014   Procedure: LAPAROSCOPIC LYSIS OF ADHESIONS;  Surgeon: Ena Dawley, MD;  Location: [facility];  Service: Gynecology;;  . LAPAROSCOPY N/A 01/10/2014   Procedure: LAPAROSCOPY OPERATIVE;  Surgeon: Ena Dawley, MD;  Location: [facility];  Service: Gynecology;  Laterality: N/A;  . TUBAL LIGATION    . UMBILICAL HERNIA REPAIR  01/10/2014   Procedure: HERNIA REPAIR UMBILICAL ADULT;  Surgeon: Ena Dawley, MD;  Location: [facility];  Service: Gynecology;;  . Arnetha Courser TOOTH EXTRACTION      Allergies  Allergen Reactions  . Cortisone Itching and Other (See Comments)    REACTION: "nerves"  . Hydrocodone-Acetaminophen Itching and Other (See  Comments)    REACTION: "nerves"  . Morphine Itching and Other (See Comments)    REACTION: "nerves"    Family History: family history includes Alzheimer's disease in her paternal grandmother; Colon cancer in her unknown relative; Diabetes in her brother, father, maternal grandmother, and mother; Heart failure in her brother, father, and mother.  Social History:  reports that she has never smoked. She has never used smokeless tobacco. She reports that she does not drink alcohol or use drugs.  Review of systems: See HPI.  Admission Physical Exam:    BMI equals 34.3  Last menstrual period 03/26/2018.  HEENT:                 Within normal limits Chest:                   Clear Heart:                    Regular rate and rhythm Breasts:                No masses, skin changes, bleeding, or discharge  present Abdomen:             Nontender, no masses Extremities:          Grossly normal Neurologic exam: Grossly normal  Pelvic exam:  External genitalia: normal general appearance Vaginal: normal without tenderness, induration or masses Cervix: normal appearance Adnexa: normal bimanual exam Uterus: Normal size  Assessment:  Postmenopausal bleeding  Diabetes  HIV disease  Plan:  The patient will undergo hysteroscopy with dilation and curettage.  She understands the indications for her surgical procedure.  She accepts the risk of, but not limited to, anesthetic complications, bleeding, infections, and possible damage to the surrounding organs.   Eli Hose 04/11/2018

## 2018-04-12 ENCOUNTER — Encounter (HOSPITAL_COMMUNITY)
Admission: AD | Disposition: A | Discharge status: Home / Self Care | Payer: Self-pay | Source: Ambulatory Visit | Attending: Obstetrics and Gynecology

## 2018-04-12 ENCOUNTER — Other Ambulatory Visit: Payer: Self-pay

## 2018-04-12 ENCOUNTER — Ambulatory Visit (HOSPITAL_COMMUNITY): Payer: Medicare Other | Admitting: Anesthesiology

## 2018-04-12 ENCOUNTER — Ambulatory Visit (HOSPITAL_COMMUNITY)
Admission: AD | Admit: 2018-04-12 | Discharge: 2018-04-12 | Disposition: A | Discharge status: Home / Self Care | Payer: Medicare Other | Source: Ambulatory Visit | Attending: Obstetrics and Gynecology | Admitting: Obstetrics and Gynecology

## 2018-04-12 ENCOUNTER — Encounter (HOSPITAL_COMMUNITY): Payer: Self-pay

## 2018-04-12 DIAGNOSIS — Z21 Asymptomatic human immunodeficiency virus [HIV] infection status: Secondary | ICD-10-CM | POA: Insufficient documentation

## 2018-04-12 DIAGNOSIS — Z794 Long term (current) use of insulin: Secondary | ICD-10-CM | POA: Insufficient documentation

## 2018-04-12 DIAGNOSIS — F419 Anxiety disorder, unspecified: Secondary | ICD-10-CM | POA: Insufficient documentation

## 2018-04-12 DIAGNOSIS — I10 Essential (primary) hypertension: Secondary | ICD-10-CM | POA: Insufficient documentation

## 2018-04-12 DIAGNOSIS — E119 Type 2 diabetes mellitus without complications: Secondary | ICD-10-CM | POA: Diagnosis not present

## 2018-04-12 DIAGNOSIS — N950 Postmenopausal bleeding: Secondary | ICD-10-CM | POA: Insufficient documentation

## 2018-04-12 HISTORY — PX: HYSTEROSCOPY W/D&C: SHX1775

## 2018-04-12 LAB — GLUCOSE, CAPILLARY
GLUCOSE-CAPILLARY: 136 mg/dL — AB (ref 70–99)
Glucose-Capillary: 129 mg/dL — ABNORMAL HIGH (ref 70–99)
Glucose-Capillary: 129 mg/dL — ABNORMAL HIGH (ref 70–99)

## 2018-04-12 SURGERY — DILATATION AND CURETTAGE /HYSTEROSCOPY
Anesthesia: General | Site: Uterus

## 2018-04-12 MED ORDER — LIDOCAINE HCL (CARDIAC) PF 100 MG/5ML IV SOSY
PREFILLED_SYRINGE | INTRAVENOUS | Status: DC | PRN
  Administered 2018-04-12: 40 mg via INTRAVENOUS

## 2018-04-12 MED ORDER — SUGAMMADEX SODIUM 200 MG/2ML IV SOLN
INTRAVENOUS | Status: AC
  Filled 2018-04-12: qty 2

## 2018-04-12 MED ORDER — PROMETHAZINE HCL 25 MG/ML IJ SOLN
6.2500 mg | INTRAMUSCULAR | Status: AC | PRN
  Administered 2018-04-12 (×2): 6.25 mg via INTRAVENOUS

## 2018-04-12 MED ORDER — OXYCODONE HCL 5 MG PO TABS: 5.0000 mg | ORAL_TABLET | Freq: Once | ORAL | Status: DC | PRN

## 2018-04-12 MED ORDER — BUPIVACAINE HCL (PF) 0.5 % IJ SOLN
INTRAMUSCULAR | Status: AC
  Filled 2018-04-12: qty 30

## 2018-04-12 MED ORDER — ONDANSETRON HCL 4 MG/2ML IJ SOLN
INTRAMUSCULAR | Status: AC
  Filled 2018-04-12: qty 2

## 2018-04-12 MED ORDER — BUPIVACAINE-EPINEPHRINE (PF) 0.5% -1:200000 IJ SOLN
INTRAMUSCULAR | Status: AC
  Filled 2018-04-12: qty 30

## 2018-04-12 MED ORDER — FENTANYL CITRATE (PF) 100 MCG/2ML IJ SOLN
INTRAMUSCULAR | Status: DC | PRN
  Administered 2018-04-12 (×2): 50 ug via INTRAVENOUS

## 2018-04-12 MED ORDER — KETOROLAC TROMETHAMINE 30 MG/ML IJ SOLN: 30.0000 mg | Freq: Once | INTRAMUSCULAR | Status: DC | PRN

## 2018-04-12 MED ORDER — SUCCINYLCHOLINE CHLORIDE 200 MG/10ML IV SOSY
PREFILLED_SYRINGE | INTRAVENOUS | Status: AC
  Filled 2018-04-12: qty 10

## 2018-04-12 MED ORDER — IBUPROFEN 100 MG/5ML PO SUSP
200.0000 mg | Freq: Four times a day (QID) | ORAL | Status: DC | PRN
  Filled 2018-04-12: qty 20

## 2018-04-12 MED ORDER — SUGAMMADEX SODIUM 200 MG/2ML IV SOLN
INTRAVENOUS | Status: DC | PRN
  Administered 2018-04-12: 250 mg via INTRAVENOUS

## 2018-04-12 MED ORDER — GLYCOPYRROLATE 0.2 MG/ML IJ SOLN
INTRAMUSCULAR | Status: AC
  Filled 2018-04-12: qty 1

## 2018-04-12 MED ORDER — OXYCODONE HCL 5 MG/5ML PO SOLN: 5.0000 mg | Freq: Once | ORAL | Status: DC | PRN

## 2018-04-12 MED ORDER — SCOPOLAMINE 1 MG/3DAYS TD PT72
1.0000 | MEDICATED_PATCH | Freq: Once | TRANSDERMAL | Status: DC
  Administered 2018-04-12: 1.5 mg via TRANSDERMAL

## 2018-04-12 MED ORDER — LIDOCAINE HCL (CARDIAC) PF 100 MG/5ML IV SOSY
PREFILLED_SYRINGE | INTRAVENOUS | Status: AC
  Filled 2018-04-12: qty 5

## 2018-04-12 MED ORDER — PROMETHAZINE HCL 25 MG/ML IJ SOLN
INTRAMUSCULAR | Status: AC
  Administered 2018-04-12: 6.25 mg via INTRAVENOUS
  Filled 2018-04-12: qty 1

## 2018-04-12 MED ORDER — MIDAZOLAM HCL 2 MG/2ML IJ SOLN
INTRAMUSCULAR | Status: AC
  Filled 2018-04-12: qty 2

## 2018-04-12 MED ORDER — PROPOFOL 10 MG/ML IV BOLUS
INTRAVENOUS | Status: DC | PRN
  Administered 2018-04-12: 200 mg via INTRAVENOUS

## 2018-04-12 MED ORDER — SUCCINYLCHOLINE CHLORIDE 20 MG/ML IJ SOLN
INTRAMUSCULAR | Status: DC | PRN
  Administered 2018-04-12: 140 mg via INTRAVENOUS

## 2018-04-12 MED ORDER — PROPOFOL 10 MG/ML IV BOLUS
INTRAVENOUS | Status: AC
  Filled 2018-04-12: qty 40

## 2018-04-12 MED ORDER — LACTATED RINGERS IV SOLN
INTRAVENOUS | Status: DC
  Administered 2018-04-12: 12:00:00 via INTRAVENOUS
  Administered 2018-04-12: 125 mL/h via INTRAVENOUS

## 2018-04-12 MED ORDER — SCOPOLAMINE 1 MG/3DAYS TD PT72
MEDICATED_PATCH | TRANSDERMAL | Status: AC
  Filled 2018-04-12: qty 1

## 2018-04-12 MED ORDER — SODIUM CHLORIDE 0.9 % IR SOLN
Status: DC | PRN
  Administered 2018-04-12: 3000 mL

## 2018-04-12 MED ORDER — MIDAZOLAM HCL 2 MG/2ML IJ SOLN
INTRAMUSCULAR | Status: DC | PRN
  Administered 2018-04-12: 1 mg via INTRAVENOUS

## 2018-04-12 MED ORDER — ROCURONIUM BROMIDE 100 MG/10ML IV SOLN
INTRAVENOUS | Status: DC | PRN
  Administered 2018-04-12: 20 mg via INTRAVENOUS
  Administered 2018-04-12: 10 mg via INTRAVENOUS

## 2018-04-12 MED ORDER — FENTANYL CITRATE (PF) 100 MCG/2ML IJ SOLN
INTRAMUSCULAR | Status: AC
  Filled 2018-04-12: qty 4

## 2018-04-12 MED ORDER — GLYCOPYRROLATE 0.2 MG/ML IJ SOLN
INTRAMUSCULAR | Status: DC | PRN
  Administered 2018-04-12: 0.1 mg via INTRAVENOUS

## 2018-04-12 MED ORDER — IBUPROFEN 200 MG PO TABS
200.0000 mg | ORAL_TABLET | Freq: Four times a day (QID) | ORAL | Status: DC | PRN
  Filled 2018-04-12: qty 2

## 2018-04-12 MED ORDER — LABETALOL HCL 5 MG/ML IV SOLN
INTRAVENOUS | Status: DC | PRN
  Administered 2018-04-12: 20 mg via INTRAVENOUS

## 2018-04-12 MED ORDER — BUPIVACAINE HCL (PF) 0.5 % IJ SOLN
INTRAMUSCULAR | Status: DC | PRN
  Administered 2018-04-12: 10 mL

## 2018-04-12 MED ORDER — FENTANYL CITRATE (PF) 100 MCG/2ML IJ SOLN: 25.0000 ug | INTRAMUSCULAR | Status: DC | PRN

## 2018-04-12 SURGICAL SUPPLY — 19 items
BIPOLAR CUTTING LOOP 21FR (ELECTRODE)
CANISTER SUCT 3000ML PPV (MISCELLANEOUS) ×4 IMPLANT
CATH ROBINSON RED A/P 16FR (CATHETERS) ×4 IMPLANT
CATH ROBINSON RED A/P 8FR (CATHETERS) ×4 IMPLANT
ELECT REM PT RETURN 9FT ADLT (ELECTROSURGICAL)
ELECTRODE REM PT RTRN 9FT ADLT (ELECTROSURGICAL) IMPLANT
GLOVE BIOGEL PI IND STRL 7.0 (GLOVE) ×2 IMPLANT
GLOVE BIOGEL PI IND STRL 8.5 (GLOVE) ×2 IMPLANT
GLOVE BIOGEL PI INDICATOR 7.0 (GLOVE) ×2
GLOVE BIOGEL PI INDICATOR 8.5 (GLOVE) ×2
GLOVE ECLIPSE 8.0 STRL XLNG CF (GLOVE) ×4 IMPLANT
GLOVE SURG SS PI 7.0 STRL IVOR (GLOVE) ×4 IMPLANT
GOWN STRL REUS W/TWL LRG LVL3 (GOWN DISPOSABLE) ×8 IMPLANT
LOOP CUTTING BIPOLAR 21FR (ELECTRODE) IMPLANT
PACK VAGINAL MINOR WOMEN LF (CUSTOM PROCEDURE TRAY) ×4 IMPLANT
PAD OB MATERNITY 4.3X12.25 (PERSONAL CARE ITEMS) ×4 IMPLANT
TOWEL OR 17X24 6PK STRL BLUE (TOWEL DISPOSABLE) ×8 IMPLANT
TUBING AQUILEX INFLOW (TUBING) ×4 IMPLANT
TUBING AQUILEX OUTFLOW (TUBING) ×4 IMPLANT

## 2018-04-12 NOTE — Op Note (Signed)
OPERATIVE NOTE  Brenda [name]  DOB:    01/26/1971  MRN:    [id number]  CSN:    [id number]  Date of Surgery:  04/12/2018  Preoperative Diagnosis:  Postmenopausal bleeding  Postoperative Diagnosis:  Postmenopausal bleeding  Procedure:  Hysteroscopy Dilatation and curettage  Surgeon:  Gildardo Cranker, M.D.  Assistant:  None  Anesthetic:  General  Disposition:  The patient is a 47 y.o.-year-old female who presents with postmenopausal bleeding. She understands the indications for her surgical procedure. She accepts the risk of, but not limited to, anesthetic complications, bleeding, infections, and possible damage to the surrounding organs.  Findings:  On examination under anesthesia the uterus was normal size. No adnexal masses were appreciated. No parametrial disease was appreciated. The uterus sounded to 9 cm. The patient was noted to have a clean endometrial cavity.  No pathology was appreciated.  The tubal ostia on the right was visualized.  I could not visualize the tubal ostia on the left.  Procedure:  The patient was taken to the operating room where a general anesthetic was given. The perineum and vagina were prepped with Betadine. The bladder was drained of urine. The patient was sterilely draped. Examination under anesthesia was performed. A paracervical block was placed using 10 cc of half percent Marcaine. An endocervical curettage was performed. The cervix was gently dilated. The diagnostic hysteroscope was inserted and the cavity was carefully inspected. Pictures were taken. Findings included: A normal endometrial cavity. The diagnostic hysteroscope was removed. The cavity was then curetted using a sharp curet. The cavity was felt to be clean at the end of our procedure. Hemostasis was adequate. All instruments were removed. The examination was repeated and the uterus was noted to be firm. Sponge, and needle counts were correct. The estimated blood  loss for the procedure was 20 cc. The estimated fluid deficit loss 0 cc. The patient was awakened from her anesthetic without difficulty. She was returned to the supine position and and transported to the recovery room in stable condition. The endocervical curettings, and endometrial curettings were sent to pathology.  Followup instructions:  The patient will return to see Dr. Raphael Gibney in 2 weeks. She was given a copy of the postoperative instructions for patients who've undergone hysteroscopy.  Discharge medications:  The patient has Motrin and nausea medication at home.  Therefore, no prescriptions were given today.  She will take ibuprofen 800 mg every 8 hours as needed for pain.  Gildardo Cranker, M.D.  April 12, 2018

## 2018-04-12 NOTE — Transfer of Care (Signed)
Immediate Anesthesia Transfer of Care Note  Patient: Brenda [name]  Procedure(s) Performed: DILATATION AND CURETTAGE /HYSTEROSCOPY (N/A Uterus)  Patient Location: PACU  Anesthesia Type:General  Level of Consciousness: drowsy  Airway & Oxygen Therapy: Patient Spontanous Breathing and Patient connected to face mask oxygen  Post-op Assessment: Report given to RN and Post -op Vital signs reviewed and stable  Post vital signs: Reviewed and stable  Last Vitals:  Vitals Value Taken Time  BP 141/92 04/12/2018 10:54 AM  Temp    Pulse 86 04/12/2018 10:59 AM  Resp 13 04/12/2018 10:59 AM  SpO2 100 % 04/12/2018 10:59 AM  Vitals shown include unvalidated device data.  Last Pain:  Vitals:   04/12/18 0830  TempSrc: Oral  PainSc: 7       Patients Stated Pain Goal: 7 (17/12/78 7183)  Complications: No apparent anesthesia complications

## 2018-04-12 NOTE — Anesthesia Preprocedure Evaluation (Signed)
Anesthesia Evaluation  Patient identified by MRN, date of birth, ID band Patient awake    Reviewed: Allergy & Precautions, NPO status , Patient's Chart, lab work & pertinent test results  Airway Mallampati: I       Dental no notable dental hx. (+) Teeth Intact   Pulmonary    Pulmonary exam normal breath sounds clear to auscultation       Cardiovascular hypertension, Pt. on medications Normal cardiovascular exam Rhythm:Regular Rate:Normal     Neuro/Psych    GI/Hepatic   Endo/Other  diabetes, Type 2, Oral Hypoglycemic Agents, Insulin Dependent  Renal/GU      Musculoskeletal   Abdominal (+) + obese,   Peds  Hematology   Anesthesia Other Findings   Reproductive/Obstetrics                             Anesthesia Physical Anesthesia Plan  ASA: II  Anesthesia Plan: General   Post-op Pain Management:    Induction: Intravenous, Rapid sequence and Cricoid pressure planned  PONV Risk Score and Plan: 4 or greater and Ondansetron, Midazolam, Scopolamine patch - Pre-op and Promethazine  Airway Management Planned: Oral ETT  Additional Equipment:   Intra-op Plan:   Post-operative Plan: Extubation in OR  Informed Consent: I have reviewed the patients History and Physical, chart, labs and discussed the procedure including the risks, benefits and alternatives for the proposed anesthesia with the patient or authorized representative who has indicated his/her understanding and acceptance.   Dental advisory given  Plan Discussed with: CRNA and Surgeon  Anesthesia Plan Comments:         Anesthesia Quick Evaluation

## 2018-04-12 NOTE — OR Nursing (Signed)
cbg recheck in [id number].

## 2018-04-12 NOTE — Anesthesia Postprocedure Evaluation (Signed)
Anesthesia Post Note  Patient: Brenda [name]  Procedure(s) Performed: DILATATION AND CURETTAGE /HYSTEROSCOPY (N/A Uterus)     Patient location during evaluation: PACU Anesthesia Type: General Level of consciousness: sedated Pain management: pain level controlled Vital Signs Assessment: post-procedure vital signs reviewed and stable Respiratory status: spontaneous breathing Cardiovascular status: stable Postop Assessment: no apparent nausea or vomiting Anesthetic complications: no    Last Vitals:  Vitals:   04/12/18 1245 04/12/18 1300  BP: 122/79 (!) 143/88  Pulse: 80 80  Resp: 16 16  Temp:  37 C  SpO2: 97% 97%    Last Pain:  Vitals:   04/12/18 1300  TempSrc:   PainSc: 0-No pain   Pain Goal: Patients Stated Pain Goal: 7 (04/12/18 0830)               Huston Foley

## 2018-04-12 NOTE — Progress Notes (Signed)
The patient was interviewed and examined today.  The previously documented history and physical examination was reviewed. There are no changes. The operative procedure was reviewed. The risks and benefits were outlined again. The specific risks include, but are not limited to, anesthetic complications, bleeding, infections, and possible damage to the surrounding organs. The patient's questions were answered.  We are ready to proceed as outlined. The likelihood of the patient achieving the goals of this procedure is very likely.  BP 136/88   Temp 98 F (36.7 C) (Oral)   Resp 18   Ht 5\' 7"  (1.702 m)   Wt 99.6 kg   LMP 03/26/2018 (Exact Date)   SpO2 98%   BMI 34.39 kg/m  CBC    Component Value Date/Time   WBC 5.5 04/05/2018 1050   RBC 3.75 (L) 04/05/2018 1050   HGB 8.8 (L) 04/05/2018 1050   HCT 28.7 (L) 04/05/2018 1050   PLT 409 (H) 04/05/2018 1050   MCV 76.5 (L) 04/05/2018 1050   MCH 23.5 (L) 04/05/2018 1050   MCHC 30.7 04/05/2018 1050   RDW 14.5 04/05/2018 1050   LYMPHSABS 2,102 06/21/2017 1128   MONOABS 0.3 05/05/2017 2019   EOSABS 400 06/21/2017 1128   BASOSABS 52 06/21/2017 1128      Gildardo Cranker, M.D.

## 2018-04-12 NOTE — OR Nursing (Signed)
Pt very sleepy sats 97% on 2liters 132/86.79.16. Answers questions appropriately when asked but answers slowly.  cbg rechedked  129. Good strong grips follows commands.  Call to dr hatchett to inform of above information,  No further orders received.  Continue to monitor closely. Darin Engels rn

## 2018-04-12 NOTE — Discharge Instructions (Signed)
DISCHARGE INSTRUCTIONS: D&C / D&E °The following instructions have been prepared to help you care for yourself upon your return home. °  °Personal hygiene: °• Use sanitary pads for vaginal drainage, not tampons. °• Shower the day after your procedure. °• NO tub baths, pools or Jacuzzis for 2-3 weeks. °• Wipe front to back after using the bathroom. ° °Activity and limitations: °• Do NOT drive or operate any equipment for 24 hours. The effects of anesthesia are still present and drowsiness may result. °• Do NOT rest in bed all day. °• Walking is encouraged. °• Walk up and down stairs slowly. °• You may resume your normal activity in one to two days or as indicated by your physician. ° °Sexual activity: NO intercourse for at least 2 weeks after the procedure, or as indicated by your physician. ° °Diet: Eat a light meal as desired this evening. You may resume your usual diet tomorrow. ° °Return to work: You may resume your work activities in one to two days or as indicated by your doctor. ° °What to expect after your surgery: Expect to have vaginal bleeding/discharge for 2-3 days and spotting for up to 10 days. It is not unusual to have soreness for up to 1-2 weeks. You may have a slight burning sensation when you urinate for the first day. Mild cramps may continue for a couple of days. You may have a regular period in 2-6 weeks. ° °Call your doctor for any of the following: °• Excessive vaginal bleeding, saturating and changing one pad every hour. °• Inability to urinate 6 hours after discharge from hospital. °• Pain not relieved by pain medication. °• Fever of 100.4° F or greater. °• Unusual vaginal discharge or odor. ° ° Call for an appointment:  ° ° °Patient’s signature: ______________________ ° °Nurse’s signature ________________________ ° °Support person's signature_______________________ ° ° °Hysteroscopy, Care After °Refer to this sheet in the next few weeks. These instructions provide you with information on  caring for yourself after your procedure. Your health care provider may also give you more specific instructions. Your treatment has been planned according to current medical practices, but problems sometimes occur. Call your health care provider if you have any problems or questions after your procedure. °What can I expect after the procedure? °After your procedure, it is typical to have the following: °· You may have some cramping. This normally lasts for a couple days. °· You may have bleeding. This can vary from light spotting for a few days to menstrual-like bleeding for 3-7 days. ° °Follow these instructions at home: °· Rest for the first 1-2 days after the procedure. °· Only take over-the-counter or prescription medicines as directed by your health care provider. Do not take aspirin. It can increase the chances of bleeding. °· Take showers instead of baths for 2 weeks or as directed by your health care provider. °· Do not drive for 24 hours or as directed. °· Do not drink alcohol while taking pain medicine. °· Do not use tampons, douche, or have sexual intercourse for 2 weeks or until your health care provider says it is okay. °· Take your temperature twice a day for 4-5 days. Write it down each time. °· Follow your health care provider's advice about diet, exercise, and lifting. °· If you develop constipation, you may: °? Take a mild laxative if your health care provider approves. °? Add bran foods to your diet. °? Drink enough fluids to keep your urine clear or pale yellow. °·   Try to have someone with you or available to you for the first 24-48 hours, especially if you were given a general anesthetic. °· Follow up with your health care provider as directed. °Contact a health care provider if: °· You feel dizzy or lightheaded. °· You feel sick to your stomach (nauseous). °· You have abnormal vaginal discharge. °· You have a rash. °· You have pain that is not controlled with medicine. °Get help right away  if: °· You have bleeding that is heavier than a normal menstrual period. °· You have a fever. °· You have increasing cramps or pain, not controlled with medicine. °· You have new belly (abdominal) pain. °· You pass out. °· You have pain in the tops of your shoulders (shoulder strap areas). °· You have shortness of breath. °This information is not intended to replace advice given to you by your health care provider. Make sure you discuss any questions you have with your health care provider. °Document Released: 03/14/2013 Document Revised: 10/30/2015 Document Reviewed: 12/21/2012 °Elsevier Interactive Patient Education © 2017 [employer] ° °

## 2018-04-12 NOTE — Anesthesia Procedure Notes (Signed)
Procedure Name: Intubation Date/Time: 04/12/2018 10:10 AM Performed by: Flossie Dibble, CRNA Pre-anesthesia Checklist: Patient identified, Patient being monitored, Timeout performed, Emergency Drugs available and Suction available Patient Re-evaluated:Patient Re-evaluated prior to induction Oxygen Delivery Method: Circle System Utilized Preoxygenation: Pre-oxygenation with 100% oxygen Induction Type: IV induction, Rapid sequence and Cricoid Pressure applied Ventilation: Mask ventilation without difficulty Laryngoscope Size: Mac and 4 Grade View: Grade II Tube type: Oral Tube size: 7.0 mm Number of attempts: 1 Airway Equipment and Method: stylet Placement Confirmation: ETT inserted through vocal cords under direct vision,  positive ETCO2 and breath sounds checked- equal and bilateral Secured at: 22 cm Tube secured with: Tape Dental Injury: Teeth and Oropharynx as per pre-operative assessment

## 2018-04-13 ENCOUNTER — Encounter (HOSPITAL_COMMUNITY): Payer: Self-pay | Admitting: Obstetrics and Gynecology

## 2018-04-14 DIAGNOSIS — G43009 Migraine without aura, not intractable, without status migrainosus: Secondary | ICD-10-CM | POA: Diagnosis not present

## 2018-04-14 DIAGNOSIS — G8918 Other acute postprocedural pain: Secondary | ICD-10-CM | POA: Diagnosis not present

## 2018-04-14 DIAGNOSIS — Z79899 Other long term (current) drug therapy: Secondary | ICD-10-CM | POA: Diagnosis not present

## 2018-04-14 DIAGNOSIS — I10 Essential (primary) hypertension: Secondary | ICD-10-CM | POA: Diagnosis not present

## 2018-04-14 DIAGNOSIS — E1165 Type 2 diabetes mellitus with hyperglycemia: Secondary | ICD-10-CM | POA: Diagnosis not present

## 2018-04-25 DIAGNOSIS — M545 Low back pain: Secondary | ICD-10-CM | POA: Diagnosis not present

## 2018-04-25 DIAGNOSIS — M25561 Pain in right knee: Secondary | ICD-10-CM | POA: Diagnosis not present

## 2018-04-25 DIAGNOSIS — Z79899 Other long term (current) drug therapy: Secondary | ICD-10-CM | POA: Diagnosis not present

## 2018-04-25 DIAGNOSIS — M25461 Effusion, right knee: Secondary | ICD-10-CM | POA: Diagnosis not present

## 2018-04-25 DIAGNOSIS — M25511 Pain in right shoulder: Secondary | ICD-10-CM | POA: Diagnosis not present

## 2018-05-01 DIAGNOSIS — M25461 Effusion, right knee: Secondary | ICD-10-CM | POA: Diagnosis not present

## 2018-05-01 DIAGNOSIS — Z79899 Other long term (current) drug therapy: Secondary | ICD-10-CM | POA: Diagnosis not present

## 2018-05-01 DIAGNOSIS — G43909 Migraine, unspecified, not intractable, without status migrainosus: Secondary | ICD-10-CM | POA: Diagnosis not present

## 2018-05-01 DIAGNOSIS — M545 Low back pain: Secondary | ICD-10-CM | POA: Diagnosis not present

## 2018-05-01 DIAGNOSIS — E1165 Type 2 diabetes mellitus with hyperglycemia: Secondary | ICD-10-CM | POA: Diagnosis not present

## 2018-05-01 DIAGNOSIS — M25511 Pain in right shoulder: Secondary | ICD-10-CM | POA: Diagnosis not present

## 2018-05-01 DIAGNOSIS — M25561 Pain in right knee: Secondary | ICD-10-CM | POA: Diagnosis not present

## 2018-05-02 DIAGNOSIS — N950 Postmenopausal bleeding: Secondary | ICD-10-CM | POA: Diagnosis not present

## 2018-05-02 DIAGNOSIS — E1165 Type 2 diabetes mellitus with hyperglycemia: Secondary | ICD-10-CM | POA: Diagnosis not present

## 2018-05-02 DIAGNOSIS — R3129 Other microscopic hematuria: Secondary | ICD-10-CM | POA: Diagnosis not present

## 2018-05-02 DIAGNOSIS — N870 Mild cervical dysplasia: Secondary | ICD-10-CM | POA: Diagnosis not present

## 2018-05-02 DIAGNOSIS — Z09 Encounter for follow-up examination after completed treatment for conditions other than malignant neoplasm: Secondary | ICD-10-CM | POA: Diagnosis not present

## 2018-05-02 MED FILL — GENVOYA TABLET: 150-150-200 | 30 days supply | Qty: 30 | Fill #2

## 2018-05-09 DIAGNOSIS — R3129 Other microscopic hematuria: Secondary | ICD-10-CM | POA: Diagnosis not present

## 2018-05-11 DIAGNOSIS — E1165 Type 2 diabetes mellitus with hyperglycemia: Secondary | ICD-10-CM | POA: Diagnosis not present

## 2018-05-11 DIAGNOSIS — F419 Anxiety disorder, unspecified: Secondary | ICD-10-CM | POA: Diagnosis not present

## 2018-05-11 DIAGNOSIS — E7800 Pure hypercholesterolemia, unspecified: Secondary | ICD-10-CM | POA: Diagnosis not present

## 2018-05-11 DIAGNOSIS — R5383 Other fatigue: Secondary | ICD-10-CM | POA: Diagnosis not present

## 2018-05-11 DIAGNOSIS — G4700 Insomnia, unspecified: Secondary | ICD-10-CM | POA: Diagnosis not present

## 2018-05-11 DIAGNOSIS — F329 Major depressive disorder, single episode, unspecified: Secondary | ICD-10-CM | POA: Diagnosis not present

## 2018-05-11 DIAGNOSIS — Z131 Encounter for screening for diabetes mellitus: Secondary | ICD-10-CM | POA: Diagnosis not present

## 2018-05-15 DIAGNOSIS — R3129 Other microscopic hematuria: Secondary | ICD-10-CM | POA: Diagnosis not present

## 2018-05-15 DIAGNOSIS — E7800 Pure hypercholesterolemia, unspecified: Secondary | ICD-10-CM | POA: Diagnosis not present

## 2018-05-15 DIAGNOSIS — E1165 Type 2 diabetes mellitus with hyperglycemia: Secondary | ICD-10-CM | POA: Diagnosis not present

## 2018-05-15 DIAGNOSIS — I10 Essential (primary) hypertension: Secondary | ICD-10-CM | POA: Diagnosis not present

## 2018-05-16 DIAGNOSIS — M545 Low back pain: Secondary | ICD-10-CM | POA: Diagnosis not present

## 2018-05-17 DIAGNOSIS — G4700 Insomnia, unspecified: Secondary | ICD-10-CM | POA: Diagnosis not present

## 2018-05-19 ENCOUNTER — Emergency Department (HOSPITAL_COMMUNITY): Payer: Medicaid Other

## 2018-05-19 ENCOUNTER — Emergency Department (HOSPITAL_COMMUNITY)
Admission: EM | Admit: 2018-05-19 | Discharge: 2018-05-19 | Disposition: A | Discharge status: Home / Self Care | Payer: Medicaid Other | Attending: Emergency Medicine | Admitting: Emergency Medicine

## 2018-05-19 ENCOUNTER — Encounter (HOSPITAL_COMMUNITY): Payer: Self-pay

## 2018-05-19 DIAGNOSIS — I10 Essential (primary) hypertension: Secondary | ICD-10-CM | POA: Diagnosis not present

## 2018-05-19 DIAGNOSIS — Z743 Need for continuous supervision: Secondary | ICD-10-CM | POA: Diagnosis not present

## 2018-05-19 DIAGNOSIS — Z794 Long term (current) use of insulin: Secondary | ICD-10-CM | POA: Insufficient documentation

## 2018-05-19 DIAGNOSIS — E119 Type 2 diabetes mellitus without complications: Secondary | ICD-10-CM | POA: Diagnosis not present

## 2018-05-19 DIAGNOSIS — R51 Headache: Secondary | ICD-10-CM | POA: Diagnosis not present

## 2018-05-19 DIAGNOSIS — B20 Human immunodeficiency virus [HIV] disease: Secondary | ICD-10-CM | POA: Diagnosis not present

## 2018-05-19 DIAGNOSIS — Z79899 Other long term (current) drug therapy: Secondary | ICD-10-CM | POA: Insufficient documentation

## 2018-05-19 DIAGNOSIS — I951 Orthostatic hypotension: Secondary | ICD-10-CM | POA: Insufficient documentation

## 2018-05-19 DIAGNOSIS — R519 Headache, unspecified: Secondary | ICD-10-CM

## 2018-05-19 DIAGNOSIS — E1165 Type 2 diabetes mellitus with hyperglycemia: Secondary | ICD-10-CM | POA: Diagnosis not present

## 2018-05-19 DIAGNOSIS — G4489 Other headache syndrome: Secondary | ICD-10-CM | POA: Diagnosis not present

## 2018-05-19 DIAGNOSIS — R42 Dizziness and giddiness: Secondary | ICD-10-CM | POA: Diagnosis not present

## 2018-05-19 LAB — CBC WITH DIFFERENTIAL/PLATELET
ABS IMMATURE GRANULOCYTES: 0.02 10*3/uL (ref 0.00–0.07)
BASOS PCT: 1 %
Basophils Absolute: 0 10*3/uL (ref 0.0–0.1)
Eosinophils Absolute: 0.3 10*3/uL (ref 0.0–0.5)
Eosinophils Relative: 4 %
HCT: 30.4 % — ABNORMAL LOW (ref 36.0–46.0)
Hemoglobin: 8.5 g/dL — ABNORMAL LOW (ref 12.0–15.0)
Immature Granulocytes: 0 %
Lymphocytes Relative: 26 %
Lymphs Abs: 1.7 10*3/uL (ref 0.7–4.0)
MCH: 20.9 pg — ABNORMAL LOW (ref 26.0–34.0)
MCHC: 28 g/dL — ABNORMAL LOW (ref 30.0–36.0)
MCV: 74.7 fL — ABNORMAL LOW (ref 80.0–100.0)
Monocytes Absolute: 0.5 10*3/uL (ref 0.1–1.0)
Monocytes Relative: 7 %
NEUTROS ABS: 4.2 10*3/uL (ref 1.7–7.7)
Neutrophils Relative %: 62 %
PLATELETS: 397 10*3/uL (ref 150–400)
RBC: 4.07 MIL/uL (ref 3.87–5.11)
RDW: 15.1 % (ref 11.5–15.5)
WBC: 6.7 10*3/uL (ref 4.0–10.5)
nRBC: 0 % (ref 0.0–0.2)

## 2018-05-19 LAB — I-STAT CHEM 8, ED
BUN: 13 mg/dL (ref 6–20)
CHLORIDE: 102 mmol/L (ref 98–111)
Calcium, Ion: 1.16 mmol/L (ref 1.15–1.40)
Creatinine, Ser: 1 mg/dL (ref 0.44–1.00)
GLUCOSE: 267 mg/dL — AB (ref 70–99)
HCT: 28 % — ABNORMAL LOW (ref 36.0–46.0)
Hemoglobin: 9.5 g/dL — ABNORMAL LOW (ref 12.0–15.0)
POTASSIUM: 3.9 mmol/L (ref 3.5–5.1)
Sodium: 138 mmol/L (ref 135–145)
TCO2: 29 mmol/L (ref 22–32)

## 2018-05-19 LAB — COMPREHENSIVE METABOLIC PANEL
ALT: 12 U/L (ref 0–44)
AST: 16 U/L (ref 15–41)
Albumin: 3.9 g/dL (ref 3.5–5.0)
Alkaline Phosphatase: 106 U/L (ref 38–126)
Anion gap: 8 (ref 5–15)
BUN: 15 mg/dL (ref 6–20)
CO2: 26 mmol/L (ref 22–32)
Calcium: 8.8 mg/dL — ABNORMAL LOW (ref 8.9–10.3)
Chloride: 103 mmol/L (ref 98–111)
Creatinine, Ser: 0.95 mg/dL (ref 0.44–1.00)
GFR calc Af Amer: >60 mL/min (ref >60)
GFR calc non Af Amer: >60 mL/min (ref >60)
Glucose, Bld: 279 mg/dL — ABNORMAL HIGH (ref 70–99)
Potassium: 4 mmol/L (ref 3.5–5.1)
Sodium: 137 mmol/L (ref 135–145)
TOTAL PROTEIN: 7.6 g/dL (ref 6.5–8.1)
Total Bilirubin: 0.5 mg/dL (ref 0.3–1.2)

## 2018-05-19 LAB — BLOOD GAS, VENOUS
Acid-Base Excess: 2.7 mmol/L — ABNORMAL HIGH (ref 0.0–2.0)
BICARBONATE: 27.7 mmol/L (ref 20.0–28.0)
O2 Saturation: 64.7 %
PH VEN: 7.382 (ref 7.250–7.430)
Patient temperature: 98.6
pCO2, Ven: 47.7 mmHg (ref 44.0–60.0)
pO2, Ven: 37.2 mmHg (ref 32.0–45.0)

## 2018-05-19 LAB — I-STAT BETA HCG BLOOD, ED (MC, WL, AP ONLY): I-stat hCG, quantitative: <5 m[IU]/mL (ref <5)

## 2018-05-19 LAB — I-STAT TROPONIN, ED: Troponin i, poc: 0 ng/mL (ref 0.00–0.08)

## 2018-05-19 LAB — TYPE AND SCREEN
ABO/RH(D): O POS
Antibody Screen: NEGATIVE

## 2018-05-19 LAB — PROTIME-INR
INR: 0.94
Prothrombin Time: 12.5 seconds (ref 11.4–15.2)

## 2018-05-19 LAB — CBG MONITORING, ED: GLUCOSE-CAPILLARY: 293 mg/dL — AB (ref 70–99)

## 2018-05-19 LAB — ABO/RH: ABO/RH(D): O POS

## 2018-05-19 LAB — APTT: aPTT: 25 seconds (ref 24–36)

## 2018-05-19 MED ORDER — IOHEXOL 300 MG/ML  SOLN
75.0000 mL | Freq: Once | INTRAMUSCULAR | Status: AC | PRN
  Administered 2018-05-19: 75 mL via INTRAVENOUS

## 2018-05-19 MED ORDER — METOCLOPRAMIDE HCL 5 MG/ML IJ SOLN
10.0000 mg | Freq: Once | INTRAMUSCULAR | Status: AC
  Administered 2018-05-19: 10 mg via INTRAVENOUS
  Filled 2018-05-19: qty 2

## 2018-05-19 MED ORDER — SUMATRIPTAN SUCCINATE 50 MG PO TABS: 50.0000 mg | ORAL_TABLET | ORAL | 0 refills | Status: DC | PRN

## 2018-05-19 MED ORDER — KETOROLAC TROMETHAMINE 15 MG/ML IJ SOLN
15.0000 mg | Freq: Once | INTRAMUSCULAR | Status: AC
  Administered 2018-05-19: 15 mg via INTRAVENOUS
  Filled 2018-05-19: qty 1

## 2018-05-19 MED ORDER — SODIUM CHLORIDE (PF) 0.9 % IJ SOLN
INTRAMUSCULAR | Status: AC
  Filled 2018-05-19: qty 50

## 2018-05-19 MED ORDER — LACTATED RINGERS IV BOLUS
1000.0000 mL | Freq: Once | INTRAVENOUS | Status: AC
  Administered 2018-05-19: 1000 mL via INTRAVENOUS

## 2018-05-19 MED ORDER — SODIUM CHLORIDE 0.9 % IV BOLUS
1000.0000 mL | Freq: Once | INTRAVENOUS | Status: AC
  Administered 2018-05-19: 1000 mL via INTRAVENOUS

## 2018-05-19 NOTE — Discharge Instructions (Addendum)
Take tylenol, motrin for headaches.   Continue imitrex for severe headaches.   Stay hydrated. Continue your current meds   Follow up with neurology and infectious disease   Return to ER if you have worse headaches, vomiting, fever, lethargy, passing out

## 2018-05-19 NOTE — ED Provider Notes (Signed)
[facility] DEPT Provider Note   CSN: [id number] Arrival date & time: 05/19/18  2229     History   Chief Complaint Chief Complaint  Patient presents with  . Near Syncope  . Hypoglycemia    HPI Brenda [name] is a 47 y.o. female.  HPI  47 year old female comes in with chief complaint of dizziness and near fainting. 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  According to the patient she was driving home from her friend's place at 3:00 when she suddenly started getting severe dizziness and feeling that she was going to faint.  Patient felt like her sugar was dropping therefore she pulled over at a gas station and drank couple of cans of soda, however her symptoms did not improve.  She continued to feel like she might faint so she called EMS.  Patient's friend reports that she arrived to arouse at 9:00.  Patient appeared sluggish, however patient has been complaining of weakness for the last several days.  They have been presuming that patient has been feeling weak because of anemia, as she is having menorrhagia and bleeding hemorrhoids.  Patient states that she required blood transfusion last year.  She is seeing GI for banding of the hemorrhoid and gynecologist for her menorrhagia.  Patient states that she is having headaches, however she has no neck pain or stiffness.  She denies any fevers, chills, UTI-like symptoms, abdominal pain.  She states that she has history of migraines and recurrent headaches.  She takes over-the-counter medicine for her headaches.  Records indicate that patient's last CD4 was 500 in July. She has been compliant with her meds.  Past Medical History:  Diagnosis Date  . Anxiety   . Arthritis    knees  . Chronic folliculitis   . Depression   . Diabetes mellitus    type 2  . Hemorrhoids   . History of blood transfusion    [facility]   .  HIV disease ([place])   . Hypertension   . Iron deficiency anemia due to chronic blood loss   . Irregular menstrual cycle   . 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 Migraines   . Obesity   . Pneumothorax, traumatic   . SVD (spontaneous vaginal delivery)    x 4    Patient Active Problem List   Diagnosis Date Noted  . Depression 02/02/2018  . Iron deficiency anemia due to chronic blood loss - hemorhoids and menses 09/21/2016  . Prolapsed internal hemorrhoids, grade 3 - bleeding 09/21/2016  . Syncope 08/14/2015  . Rash and nonspecific skin eruption 06/30/2011  . Obesity   . MENORRHAGIA 09/04/2008  . Diarrhea 11/28/2007  . Insulin dependent type 2 diabetes mellitus, uncontrolled ([place]) 02/20/2007  . Human immunodeficiency virus (HIV) disease ([place]) 03/11/2006  . 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 Migraine headache 03/11/2006  . FOLLICULITIS 79/89/2119    Past Surgical History:  Procedure Laterality Date  . CHEST TUBE INSERTION     and removal of chest tube  . COLONOSCOPY    . HEMORRHOID BANDING    . HYSTEROSCOPY W/D&C  Removal of Endometrial Polyp   Dr. Raphael Gibney 2011  . HYSTEROSCOPY W/D&C N/A 04/12/2018   Procedure: DILATATION AND CURETTAGE /HYSTEROSCOPY;  Surgeon: Ena Dawley, MD;  Location: [facility];  Service: Gynecology;  Laterality: N/A;  . LAPAROSCOPIC LYSIS OF ADHESIONS  01/10/2014   Procedure: LAPAROSCOPIC LYSIS OF ADHESIONS;  Surgeon: Ena Dawley, MD;  Location: [facility];  Service: Gynecology;;  . LAPAROSCOPY N/A 01/10/2014   Procedure: LAPAROSCOPY OPERATIVE;  Surgeon: Ena Dawley, MD;  Location: [facility];  Service: Gynecology;  Laterality: N/A;  . TUBAL LIGATION    . UMBILICAL HERNIA REPAIR  01/10/2014   Procedure: HERNIA REPAIR UMBILICAL ADULT;  Surgeon: Ena Dawley, MD;  Location: [facility];  Service: Gynecology;;  . Arnetha Courser TOOTH EXTRACTION       OB History    Gravida  8   Para  4   Term  4   Preterm      AB  4   Living  4     SAB  4   TAB        Ectopic      Multiple      Live Births               Home Medications    Prior to Admission medications   Medication Sig Start Date End Date Taking? Authorizing Provider  clonazePAM (KLONOPIN) 0.5 MG tablet Take 0.5 mg by mouth daily as needed for anxiety.   Yes [provider]  diphenhydrAMINE (BENADRYL) 25 mg capsule Take 50 mg by mouth daily as needed for allergies.   Yes [provider]  elvitegravir-cobicistat-emtricitabine-tenofovir (GENVOYA) 150-150-200-10 MG TABS tablet Take 1 tablet by mouth daily with breakfast. 03/13/18  Yes Campbell Riches, MD  empagliflozin (JARDIANCE) 10 MG TABS tablet Take 10 mg by mouth daily.   Yes [provider]  ibuprofen (ADVIL,MOTRIN) 800 MG tablet Take 1,600 mg by mouth daily as needed for headache or moderate pain.  10/08/16  Yes [provider]  Insulin Glargine (TOUJEO SOLOSTAR) 300 UNIT/ML SOPN Inject 30 Units into the skin every morning.    Yes [provider]  losartan (COZAAR) 25 MG tablet Take 12.5 mg by mouth daily.   Yes [provider]  metFORMIN (GLUCOPHAGE) 1000 MG tablet Take 1,000 mg by mouth daily. 03/31/18  Yes [provider]  Multiple Vitamins-Minerals (MULTIVITAMIN PO) Take 1 tablet by mouth daily.   Yes [provider]  Omega-3 Fatty Acids (FISH OIL) 1000 MG CAPS Take 1,000 mg by mouth daily.   Yes [provider]  oxyCODONE-acetaminophen (PERCOCET) 10-325 MG tablet Take 1 tablet by mouth 4 (four) times daily as needed for pain.   Yes [provider]  promethazine (PHENERGAN) 25 MG tablet Take 25 mg by mouth 3 (three) times daily as needed for nausea or vomiting.  06/11/16  Yes [provider]  Semaglutide, 1 MG/DOSE, (OZEMPIC, 1 MG/DOSE,) 2 MG/1.5ML SOPN Inject 1 mg into the skin every Monday.   Yes [provider]  sertraline (ZOLOFT) 100 MG tablet Take 100 mg by mouth at bedtime.   Yes [provider]   SUMAtriptan (IMITREX) 50 MG tablet Take 50 mg by mouth as needed for migraine. 04/06/18  Yes [provider]  traZODone (DESYREL) 100 MG tablet Take 100 mg by mouth at bedtime. 11/05/16  Yes [provider]  VOLTAREN 1 % GEL Apply 2 g topically 4 (four) times daily as needed (for knee pain).  08/03/16  Yes [provider]    Family History Family History  Problem Relation Age of Onset  . Diabetes Mother   . Heart failure Mother   . Heart failure Father   . Diabetes Father   . Heart failure Brother   . Diabetes Brother   . Colon cancer Unknown  neg hx.  . Diabetes Maternal Grandmother   . Alzheimer's disease Paternal Grandmother     Social History Social History   Tobacco Use  . Smoking status: Never Smoker  . Smokeless tobacco: Never Used  Substance Use Topics  . Alcohol use: No    Alcohol/week: 0.0 standard drinks  . Drug use: No     Allergies   Cortisone; Hydrocodone-acetaminophen; and Morphine   Review of Systems Review of Systems  Constitutional: Positive for activity change.  Eyes: Positive for photophobia.  Respiratory: Negative for shortness of breath.   Cardiovascular: Negative for chest pain.  Allergic/Immunologic: Positive for immunocompromised state.  Neurological: Positive for dizziness and headaches. Negative for syncope, speech difficulty and weakness.  Hematological: Does not bruise/bleed easily.  All other systems reviewed and are negative.    Physical Exam Updated Vital Signs BP 135/79   Pulse 72   Temp 98 F (36.7 C) (Oral)   Resp 18   SpO2 96%   Physical Exam Vitals signs and nursing note reviewed.  Constitutional:      Appearance: She is well-developed.  HENT:     Head: Normocephalic and atraumatic.  Eyes:     Extraocular Movements: Extraocular movements intact.     Pupils: Pupils are equal, round, and reactive to light.  Neck:     Musculoskeletal: Normal range of motion and neck supple. No neck  rigidity.  Cardiovascular:     Rate and Rhythm: Normal rate.  Pulmonary:     Effort: Pulmonary effort is normal.  Abdominal:     General: Bowel sounds are normal.  Skin:    General: Skin is warm and dry.  Neurological:     General: No focal deficit present.     Mental Status: She is alert and oriented to person, place, and time.     Comments: Sleepy      ED Treatments / Results  Labs (all labs ordered are listed, but only abnormal results are displayed) Labs Reviewed  COMPREHENSIVE METABOLIC PANEL - Abnormal; Notable for the following components:      Result Value   Glucose, Bld 279 (*)    Calcium 8.8 (*)    All other components within normal limits  CBC WITH DIFFERENTIAL/PLATELET - Abnormal; Notable for the following components:   Hemoglobin 8.5 (*)    HCT 30.4 (*)    MCV 74.7 (*)    MCH 20.9 (*)    MCHC 28.0 (*)    All other components within normal limits  BLOOD GAS, VENOUS - Abnormal; Notable for the following components:   Acid-Base Excess 2.7 (*)    All other components within normal limits  CBG MONITORING, ED - Abnormal; Notable for the following components:   Glucose-Capillary 293 (*)    All other components within normal limits  I-STAT CHEM 8, ED - Abnormal; Notable for the following components:   Glucose, Bld 267 (*)    Hemoglobin 9.5 (*)    HCT 28.0 (*)    All other components within normal limits  PROTIME-INR  APTT  HERPES SIMPLEX VIRUS(HSV) DNA BY PCR  CRYPTOCOCCAL ANTIGEN, CSF  I-STAT BETA HCG BLOOD, ED (MC, [facility], [facility] ONLY)  I-STAT TROPONIN, ED  TYPE AND SCREEN  ABO/RH    EKG EKG Interpretation  Date/Time:  Friday May 19 2018 05:54:44 EST Ventricular Rate:  81 PR Interval:    QRS Duration: 100 QT Interval:  413 QTC Calculation: 480 R Axis:   75 Text Interpretation:  Sinus rhythm Borderline prolonged  PR interval Probable left ventricular hypertrophy No acute changes Nonspecific ST and T wave abnormality Confirmed by Varney Biles  [phone]) on 05/19/2018 6:22:58 AM   Radiology No results found.  Procedures Procedures (including critical care time)  Medications Ordered in ED Medications  sodium chloride 0.9 % bolus 1,000 mL (0 mLs Intravenous Stopped 05/19/18 0718)  metoCLOPramide (REGLAN) injection 10 mg (10 mg Intravenous Given 05/19/18 0553)  ketorolac (TORADOL) 15 MG/ML injection 15 mg (15 mg Intravenous Given 05/19/18 0717)  metoCLOPramide (REGLAN) injection 10 mg (10 mg Intravenous Given 05/19/18 0717)  lactated ringers bolus 1,000 mL (1,000 mLs Intravenous New Bag/Given 05/19/18 0718)     Initial Impression / Assessment and Plan / ED Course  I have reviewed the triage vital signs and the nursing notes.  Pertinent labs & imaging results that were available during my care of the patient were reviewed by me and considered in my medical decision making (see chart for details).  Clinical Course as of May 19 748  Fri May 19, 2018  0615 Results from the ER workup discussed with the patient face to face and all questions answered to the best of my ability.  Patient's hemoglobin is 8.5, however it was 8.8 last month.  I do not think that she needs emergent blood transfusion.  Patient does not have anion gap acidosis or nonketotic DKA.  Electrolytes are within normal limits.  Patient given Reglan for her headache and she reports that her headache has improved to 7 out of 10.  Patient is sleepy at this time, and needs to be aroused.  She is oriented x3 and answering all my questions appropriately.  We will get orthostatics and ambulatory pulse ox and reassess.  Patient and family wants to go home.  She reports that she is just extremely tired, and would prefer resting home.  Family states that patient usually sleeps until 1 in the afternoon, and therefore they are not alarmed with the fact that I have to arouse patient.  My repeat exam reveals no meningismus, no nystagmus.  We will reassess the patient and  finalize disposition.  CBC with Differential(!) [AN]  [id number] Patient reassessed with the family one more time. Ambulatory pulse ox was normal.  Patient ambulated without any ataxia.  Orthostatics however was positive.  We will give her another liter bolus.   [AN]  [id number] I reassessed the patient.  She still somnolent, and needs to be aroused.  I discussed the case with Dr. Karolee Ohs, ID -is a think we might have to rule out encephalitis in this patient.  Dr. Graylon Good will come and see the patient but she does agree that we should get LP.  CT head with and without contrast is ordered and pending.  Dr. Darl Householder will follow up on the results.  I spoke with Dr. Laurence Ferrari IR about fluoroscopy guided LP.  He has requested that we put in the order and the radiology tech will contact us at 8 AM.   [AN]    Clinical Course User Index [AN] Varney Biles, MD    47 year old female with history of HIV, diabetes, menorrhagia and actively bleeding hemorrhoids with previous history of blood transfusion comes in with chief complaint of weakness and near fainting.  Patient's blood sugar has been normal. She is somnolent, but when aroused she is giving appropriate responses.  History is not suggestive of any infection like prodrome over the past few days and patient is having headaches but no meningismus.  She has  a history of migraines, and states that her headaches are consistent with her prior migrainous headaches.  She gets headaches like this frequently and typically she takes Tylenol for them.  The headaches actually started while patient was at the gas station -prior to ED arrival.  Differential diagnosis includes symptomatic anemia, arrhythmia, DKA. Her headaches appear to be greenish type headache as there is photophobia.  The headaches started right before ED arrival and after she started having dizziness and lightheadedness -therefore it does not appear that she has encephalitis.  However, her mental  status finding does dictate that we perform multiple reassessments.  Final Clinical Impressions(s) / ED Diagnoses   Final diagnoses:  None    ED Discharge Orders    None       Varney Biles, MD 05/19/18 [id number]

## 2018-05-19 NOTE — ED Provider Notes (Signed)
  Physical Exam  BP 95/62 (BP Location: Right Arm)   Pulse 88   Temp 98 F (36.7 C) (Oral)   Resp 17   SpO2 100%   Physical Exam  ED Course/Procedures   Clinical Course as of May 19 942  Fri May 19, 2018  0615 Results from the ER workup discussed with the patient face to face and all questions answered to the best of my ability.  Patient's hemoglobin is 8.5, however it was 8.8 last month.  I do not think that she needs emergent blood transfusion.  Patient does not have anion gap acidosis or nonketotic DKA.  Electrolytes are within normal limits.  Patient given Reglan for her headache and she reports that her headache has improved to 7 out of 10.  Patient is sleepy at this time, and needs to be aroused.  She is oriented x3 and answering all my questions appropriately.  We will get orthostatics and ambulatory pulse ox and reassess.  Patient and family wants to go home.  She reports that she is just extremely tired, and would prefer resting home.  Family states that patient usually sleeps until 1 in the afternoon, and therefore they are not alarmed with the fact that I have to arouse patient.  My repeat exam reveals no meningismus, no nystagmus.  We will reassess the patient and finalize disposition.  CBC with Differential(!) [AN]  [id number] Patient reassessed with the family one more time. Ambulatory pulse ox was normal.  Patient ambulated without any ataxia.  Orthostatics however was positive.  We will give her another liter bolus.   [AN]  [id number] I reassessed the patient.  She still somnolent, and needs to be aroused.  I discussed the case with Dr. Karolee Ohs, ID -is a think we might have to rule out encephalitis in this patient.  Dr. Graylon Good will come and see the patient but she does agree that we should get LP.  CT head with and without contrast is ordered and pending.  Dr. Darl Householder will follow up on the results.  I spoke with Dr. Laurence Ferrari IR about fluoroscopy guided LP.  He has requested  that we put in the order and the radiology tech will contact us at 8 AM.   [AN]    Clinical Course User Index [AN] Varney Biles, MD    Procedures  MDM  Care assumed at 8 am from Dr. Kathrynn Humble.  Patient came here yesterday for possible syncope, headaches.  Patient does have history of HIV that is well controlled.  Also history of migraines. Patient was orthostatics initially and was given IVF, migraine cocktail. Dr. Kathrynn Humble called Dr. Graylon Good from ID due to poor mental status and CT w/wo and LP recommended to r/o HSV encephalitis.   9:43 AM Patient awake and alert. Felt great. States that headache improved. CT w/wo showed no masses or abscess. She is still orthostatic after 2 L NS bolus but ambulated to the restroom with no problems. I suspect that her orthostasis is likely secondary to HIV. I talked in detail about LP to r/o HSV encephalitis. Family and patient states that she has migraines and felt better and would rather hold off on the procedure. Patient afebrile, alert and oriented. Normal neuro exam now. I talked to Dr. Graylon Good again and given normal CT w/wo and normal neuro exam, encephalitis less likely. Will discharge home with ID, neuro follow up.        Drenda Freeze, MD 05/19/18 [phone]

## 2018-05-19 NOTE — ED Triage Notes (Signed)
[place] [employer] [place] Pt friend of pt called EMS after she became weak and had near syncopal episode. Pt report feeling weakness increasing over last several days.. Co headache and has Hx of migraines

## 2018-05-19 NOTE — ED Notes (Signed)
Pulse 96 while ambulating.

## 2018-05-25 ENCOUNTER — Ambulatory Visit: Payer: Medicare Other | Admitting: Internal Medicine

## 2018-05-25 ENCOUNTER — Encounter: Payer: Self-pay | Admitting: Internal Medicine

## 2018-05-25 DIAGNOSIS — D500 Iron deficiency anemia secondary to blood loss (chronic): Secondary | ICD-10-CM | POA: Diagnosis not present

## 2018-05-25 DIAGNOSIS — K642 Third degree hemorrhoids: Secondary | ICD-10-CM | POA: Diagnosis not present

## 2018-05-25 MED ORDER — HYDROCORTISONE 2.5 % RE CREA: 1.0000 "application " | TOPICAL_CREAM | Freq: Two times a day (BID) | RECTAL | 1 refills | Status: DC

## 2018-05-25 NOTE — Assessment & Plan Note (Addendum)
Refer to hematology to see about parenteral iron Colonoscopy

## 2018-05-25 NOTE — Patient Instructions (Signed)
We have sent the following medications to your pharmacy for you to pick up at your convenience: Hydrocortisone cream   It has been recommended to you by your physician that you have a(n) colonoscopy completed. We don't have our February schedule out yet. We will contact you when this comes out so we can set up a pre-visit and colonoscopy appointment date/times.   We are going to refer you to hematology for your anemia.   I appreciate the opportunity to care for you. Silvano Rusk, MD, [facility]

## 2018-05-25 NOTE — Assessment & Plan Note (Signed)
Still having bleeding and other symptoms

## 2018-05-25 NOTE — Progress Notes (Signed)
Brenda [name] 47 y.o. 08-28-1970 [id number]  Assessment & Plan:   Encounter Diagnoses  Name Primary?  . Iron deficiency anemia due to chronic blood loss - hemorhoids and menses   . Prolapsed internal hemorrhoids, grade 3 - bleeding     Probably still "just" hemorrhoids bleeding but will go ahead with a colonoscopy to evaluate to be more sure. Menorrhagia part of the situation also.  Refer to hematology re: parenteral iron tx  Further plans pending colonoscopy - hemorrhoid ligation vs surgery - had pain x 1 week after last banding  The risks and benefits as well as alternatives of endoscopic procedure(s) have been discussed and reviewed. All questions answered. The patient agrees to proceed.  I appreciate the opportunity to care for you. Brenda [name], Brenda Gip., MD Dr. Ena [name]  Subjective:   Chief Complaint: blood in stools and LUQ pain  HPI Returns due to persistent rectal bleeding/hematochzia. Long hx hemorrhoidal bleeding and had a Grade 3 prolapsed internal hemorrhoid banded last year  "I hurt for a week". Did not come back as planned. Still bleeding. Menses are irregular but still heavy. Going back for LEEP procedure after recent hysteroscopy by Brenda [name] ED vsisit dizzy Hgb 8.5 05/19/2018 CT abd/pelvis 08/2016 w/ contrast neg Colonoscopy 2012 - internal hemorrhoids   CBC Latest Ref Rng & Units 05/19/2018 05/19/2018 04/05/2018  WBC 4.0 - 10.5 K/uL - 6.7 5.5  Hemoglobin 12.0 - 15.0 g/dL 9.5(L) 8.5(L) 8.8(L)  Hematocrit 36.0 - 46.0 % 28.0(L) 30.4(L) 28.7(L)  Platelets 150 - 400 K/uL - 397 409(H)   Had feraheme in past "it didn't help me" Oral iron intolerance vs non-compliance ++ fatigue Frequent headaches also  Allergies  Allergen Reactions  . Cortisone Itching and Other (See Comments)    REACTION: "nerves"  . Hydrocodone-Acetaminophen Itching and Other (See Comments)    REACTION: "nerves"  . Morphine Itching and Other (See Comments)   REACTION: "nerves"   Current Meds  Medication Sig  . clonazePAM (KLONOPIN) 0.5 MG tablet Take 0.5 mg by mouth daily as needed for anxiety.  . diphenhydrAMINE (BENADRYL) 25 mg capsule Take 50 mg by mouth daily as needed for allergies.  Marland Kitchen elvitegravir-cobicistat-emtricitabine-tenofovir (GENVOYA) 150-150-200-10 MG TABS tablet Take 1 tablet by mouth daily with breakfast.  . empagliflozin (JARDIANCE) 10 MG TABS tablet Take 10 mg by mouth daily.  . Insulin Glargine (TOUJEO SOLOSTAR) 300 UNIT/ML SOPN Inject 30 Units into the skin every morning.   Marland Kitchen losartan (COZAAR) 25 MG tablet Take 12.5 mg by mouth daily.  . metFORMIN (GLUCOPHAGE) 1000 MG tablet Take 1,000 mg by mouth daily.  . Multiple Vitamins-Minerals (MULTIVITAMIN PO) Take 1 tablet by mouth daily.  . Omega-3 Fatty Acids (FISH OIL) 1000 MG CAPS Take 1,000 mg by mouth daily.  Marland Kitchen oxyCODONE-acetaminophen (PERCOCET) 10-325 MG tablet Take 1 tablet by mouth 4 (four) times daily as needed for pain.  . Semaglutide, 1 MG/DOSE, (OZEMPIC, 1 MG/DOSE,) 2 MG/1.5ML SOPN Inject 1 mg into the skin every Monday.  . sertraline (ZOLOFT) 100 MG tablet Take 100 mg by mouth at bedtime.  . traZODone (DESYREL) 100 MG tablet Take 100 mg by mouth at bedtime.  . VOLTAREN 1 % GEL Apply 2 g topically 4 (four) times daily as needed (for knee pain).    Past Medical History:  Diagnosis Date  . Anxiety   . Arthritis    knees  . Chronic folliculitis   . Depression   . Diabetes mellitus    type 2  .  Hemorrhoids   . History of blood transfusion    MC   . HIV disease ([place])   . Hypertension   . Iron deficiency anemia due to chronic blood loss   . Irregular menstrual cycle   . 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 Migraines   . Obesity   . Pneumothorax, traumatic   . SVD (spontaneous vaginal delivery)    x 4   Past Surgical History:  Procedure Laterality Date  . CHEST TUBE INSERTION     and removal of chest tube  . COLONOSCOPY    . HEMORRHOID BANDING    .  HYSTEROSCOPY W/D&C  Removal of Endometrial Polyp   Brenda [name] 2011  . HYSTEROSCOPY W/D&C N/A 04/12/2018   Procedure: DILATATION AND CURETTAGE /HYSTEROSCOPY;  Surgeon: Brenda Dawley, MD;  Location: [facility];  Service: Gynecology;  Laterality: N/A;  . LAPAROSCOPIC LYSIS OF ADHESIONS  01/10/2014   Procedure: LAPAROSCOPIC LYSIS OF ADHESIONS;  Surgeon: Brenda Dawley, MD;  Location: [facility];  Service: Gynecology;;  . LAPAROSCOPY N/A 01/10/2014   Procedure: LAPAROSCOPY OPERATIVE;  Surgeon: Brenda Dawley, MD;  Location: [facility];  Service: Gynecology;  Laterality: N/A;  . TUBAL LIGATION    . UMBILICAL HERNIA REPAIR  01/10/2014   Procedure: HERNIA REPAIR UMBILICAL ADULT;  Surgeon: Brenda Dawley, MD;  Location: [facility];  Service: Gynecology;;  . Arnetha Courser TOOTH EXTRACTION     Social History   Social History Narrative   Daily caffeine: 1 coffee/day   4 children, 3 at home youngest born 2006   2 grandchildren    Employed: Painting and Education administrator    Education: High school   Single         family history includes Alzheimer's disease in her paternal grandmother; Colon cancer in an other family member; Diabetes in her brother, father, maternal grandmother, and mother; Heart failure in her brother, father, and mother.   Review of Systems As above   Objective:   Physical Exam BP 136/70   Pulse 78   Ht 5\' 9"  (1.753 m)   Wt 216 lb (98 kg)   SpO2 98%   BMI 31.90 kg/m  Middle age bw NAD Lungs cta Cor s1s2 no rmg abd soft mildly tender LLQ Skin - multiple hyperpigmented lesions/scars  Brenda [name], CMA present.  Rectal  Fleshy anal Tags  NL DRE - sl tender, brown stool no mass  Anoscopy   Gr 2 prolapsed inflamed int/ext hemorrhoids all positions

## 2018-05-26 DIAGNOSIS — Z79899 Other long term (current) drug therapy: Secondary | ICD-10-CM | POA: Diagnosis not present

## 2018-05-26 MED FILL — GENVOYA TABLET: 150-150-200 | 30 days supply | Qty: 30 | Fill #3

## 2018-06-01 ENCOUNTER — Telehealth: Payer: Self-pay | Admitting: Hematology and Oncology

## 2018-06-01 ENCOUNTER — Encounter: Payer: Self-pay | Admitting: Hematology and Oncology

## 2018-06-01 NOTE — Telephone Encounter (Signed)
New referral received from Dr. Carlean Purl for IDA. Pt has been cld and scheduled to see Dr. Lindi Adie on 1/16 at 345pm. Aware to arrive 30 minutes early. Letter mailed.

## 2018-06-12 DIAGNOSIS — H538 Other visual disturbances: Secondary | ICD-10-CM | POA: Diagnosis not present

## 2018-06-12 DIAGNOSIS — H2511 Age-related nuclear cataract, right eye: Secondary | ICD-10-CM | POA: Diagnosis not present

## 2018-06-12 DIAGNOSIS — E0865 Diabetes mellitus due to underlying condition with hyperglycemia: Secondary | ICD-10-CM | POA: Diagnosis not present

## 2018-06-16 DIAGNOSIS — M25532 Pain in left wrist: Secondary | ICD-10-CM | POA: Diagnosis not present

## 2018-06-16 DIAGNOSIS — E1165 Type 2 diabetes mellitus with hyperglycemia: Secondary | ICD-10-CM | POA: Diagnosis not present

## 2018-06-16 DIAGNOSIS — M25562 Pain in left knee: Secondary | ICD-10-CM | POA: Diagnosis not present

## 2018-06-16 DIAGNOSIS — M25561 Pain in right knee: Secondary | ICD-10-CM | POA: Diagnosis not present

## 2018-06-16 DIAGNOSIS — G43109 Migraine with aura, not intractable, without status migrainosus: Secondary | ICD-10-CM | POA: Diagnosis not present

## 2018-06-16 DIAGNOSIS — M25521 Pain in right elbow: Secondary | ICD-10-CM | POA: Diagnosis not present

## 2018-06-16 DIAGNOSIS — M25531 Pain in right wrist: Secondary | ICD-10-CM | POA: Diagnosis not present

## 2018-06-16 DIAGNOSIS — M542 Cervicalgia: Secondary | ICD-10-CM | POA: Diagnosis not present

## 2018-06-18 DIAGNOSIS — E1165 Type 2 diabetes mellitus with hyperglycemia: Secondary | ICD-10-CM | POA: Diagnosis not present

## 2018-06-18 DIAGNOSIS — I10 Essential (primary) hypertension: Secondary | ICD-10-CM | POA: Diagnosis not present

## 2018-06-18 DIAGNOSIS — E7800 Pure hypercholesterolemia, unspecified: Secondary | ICD-10-CM | POA: Diagnosis not present

## 2018-06-19 ENCOUNTER — Telehealth: Payer: Self-pay | Admitting: Hematology and Oncology

## 2018-06-19 NOTE — Telephone Encounter (Signed)
R/s appt on 1/16 per Gudena request - pt is aware of new time and still to come in 30 mins early

## 2018-06-20 ENCOUNTER — Other Ambulatory Visit: Payer: Self-pay | Admitting: Obstetrics and Gynecology

## 2018-06-20 DIAGNOSIS — N870 Mild cervical dysplasia: Secondary | ICD-10-CM | POA: Diagnosis not present

## 2018-06-20 DIAGNOSIS — L293 Anogenital pruritus, unspecified: Secondary | ICD-10-CM | POA: Diagnosis not present

## 2018-06-20 DIAGNOSIS — B009 Herpesviral infection, unspecified: Secondary | ICD-10-CM | POA: Diagnosis not present

## 2018-06-21 ENCOUNTER — Telehealth: Payer: Self-pay | Admitting: Hematology and Oncology

## 2018-06-21 DIAGNOSIS — H5213 Myopia, bilateral: Secondary | ICD-10-CM | POA: Diagnosis not present

## 2018-06-21 MED FILL — GENVOYA TABLET: 150-150-200 | 30 days supply | Qty: 30 | Fill #4

## 2018-06-21 NOTE — Telephone Encounter (Signed)
New patient appt has been moved back to 1/16 at 330pm. Pt has been notified of appt change

## 2018-06-22 ENCOUNTER — Inpatient Hospital Stay: Payer: Medicaid Other | Admitting: Hematology and Oncology

## 2018-06-22 ENCOUNTER — Inpatient Hospital Stay: Payer: Medicaid Other

## 2018-06-22 ENCOUNTER — Inpatient Hospital Stay: Payer: Medicaid Other | Attending: Hematology and Oncology | Admitting: Hematology and Oncology

## 2018-06-22 VITALS — BP 160/86 | HR 91 | Temp 98.4°F | Resp 18 | Ht 69.0 in | Wt 216.8 lb

## 2018-06-22 DIAGNOSIS — Z794 Long term (current) use of insulin: Secondary | ICD-10-CM | POA: Insufficient documentation

## 2018-06-22 DIAGNOSIS — D508 Other iron deficiency anemias: Secondary | ICD-10-CM

## 2018-06-22 DIAGNOSIS — D509 Iron deficiency anemia, unspecified: Secondary | ICD-10-CM

## 2018-06-22 DIAGNOSIS — K649 Unspecified hemorrhoids: Secondary | ICD-10-CM | POA: Diagnosis not present

## 2018-06-22 DIAGNOSIS — D500 Iron deficiency anemia secondary to blood loss (chronic): Secondary | ICD-10-CM

## 2018-06-22 DIAGNOSIS — M129 Arthropathy, unspecified: Secondary | ICD-10-CM | POA: Diagnosis not present

## 2018-06-22 DIAGNOSIS — F329 Major depressive disorder, single episode, unspecified: Secondary | ICD-10-CM | POA: Insufficient documentation

## 2018-06-22 DIAGNOSIS — E119 Type 2 diabetes mellitus without complications: Secondary | ICD-10-CM | POA: Diagnosis not present

## 2018-06-22 DIAGNOSIS — Z79899 Other long term (current) drug therapy: Secondary | ICD-10-CM | POA: Diagnosis not present

## 2018-06-22 DIAGNOSIS — B20 Human immunodeficiency virus [HIV] disease: Secondary | ICD-10-CM | POA: Diagnosis not present

## 2018-06-22 DIAGNOSIS — Z23 Encounter for immunization: Secondary | ICD-10-CM | POA: Diagnosis not present

## 2018-06-22 DIAGNOSIS — N920 Excessive and frequent menstruation with regular cycle: Secondary | ICD-10-CM | POA: Diagnosis not present

## 2018-06-22 DIAGNOSIS — E669 Obesity, unspecified: Secondary | ICD-10-CM | POA: Insufficient documentation

## 2018-06-22 LAB — CBC WITH DIFFERENTIAL (CANCER CENTER ONLY)
Abs Immature Granulocytes: 0.01 10*3/uL (ref 0.00–0.07)
Basophils Absolute: 0 10*3/uL (ref 0.0–0.1)
Basophils Relative: 0 %
EOS PCT: 6 %
Eosinophils Absolute: 0.4 10*3/uL (ref 0.0–0.5)
HCT: 30.7 % — ABNORMAL LOW (ref 36.0–46.0)
Hemoglobin: 8.8 g/dL — ABNORMAL LOW (ref 12.0–15.0)
Immature Granulocytes: 0 %
Lymphocytes Relative: 31 %
Lymphs Abs: 2.3 10*3/uL (ref 0.7–4.0)
MCH: 20.1 pg — ABNORMAL LOW (ref 26.0–34.0)
MCHC: 28.7 g/dL — AB (ref 30.0–36.0)
MCV: 70.3 fL — ABNORMAL LOW (ref 80.0–100.0)
Monocytes Absolute: 0.6 10*3/uL (ref 0.1–1.0)
Monocytes Relative: 8 %
Neutro Abs: 3.9 10*3/uL (ref 1.7–7.7)
Neutrophils Relative %: 55 %
Platelet Count: 400 10*3/uL (ref 150–400)
RBC: 4.37 MIL/uL (ref 3.87–5.11)
RDW: 16.8 % — ABNORMAL HIGH (ref 11.5–15.5)
WBC Count: 7.2 10*3/uL (ref 4.0–10.5)
nRBC: 0 % (ref 0.0–0.2)

## 2018-06-22 NOTE — Progress Notes (Signed)
[facility] NOTE  Patient Care Team: Sherald Hess., MD as PCP - General (Family Medicine) Gatha Mayer, MD as Referring Physician (Gastroenterology) Ena Dawley, MD as Consulting Physician (Obstetrics and Gynecology)  CHIEF COMPLAINTS/PURPOSE OF CONSULTATION: Newly diagnosed iron deficiency anemia   HISTORY OF PRESENTING ILLNESS:  Brenda [name] 48 y.o. female is here because of recent diagnosis of iron deficiency anemia. She was referred by Dr.Gessner, her gastroenterologist. She presents to the clinic today alone. She reports she has known about her iron deficiency for over 10 years. She reports she has previously tried oral iron, which did not help and made her constipated. She previously had IV iron therapy at [facility] in 10/2016 which led to nausea and vomiting. She had a blood transfusion in 2016. She has very heavy menstrual periods and hemorrhoids. She reports she will have a colonoscopy next month and will have her hemorrhoids removed. She denies any history of stomach surgeries. She denies any intolerance of bread or pasta. 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 She reviewed her medication list with me.   I reviewed her records extensively and collaborated the history with the patient.  MEDICAL HISTORY:  Past Medical History:  Diagnosis Date  . Anxiety   . Arthritis    knees  . Chronic folliculitis   . Depression   . Diabetes mellitus    type 2  . Hemorrhoids   . History of blood transfusion    MC   . HIV disease ([place])   . Hypertension   . Iron deficiency anemia due to chronic blood loss   . Irregular menstrual cycle   . 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 Migraines   . Obesity   . Pneumothorax, traumatic   . SVD (spontaneous vaginal delivery)    x 4    SURGICAL HISTORY: Past Surgical History:  Procedure Laterality Date  . CHEST TUBE INSERTION     and removal of chest tube  . COLONOSCOPY     . HEMORRHOID BANDING    . HYSTEROSCOPY W/D&C  Removal of Endometrial Polyp   Dr. Raphael Gibney 2011  . HYSTEROSCOPY W/D&C N/A 04/12/2018   Procedure: DILATATION AND CURETTAGE /HYSTEROSCOPY;  Surgeon: Ena Dawley, MD;  Location: [facility];  Service: Gynecology;  Laterality: N/A;  . LAPAROSCOPIC LYSIS OF ADHESIONS  01/10/2014   Procedure: LAPAROSCOPIC LYSIS OF ADHESIONS;  Surgeon: Ena Dawley, MD;  Location: [facility];  Service: Gynecology;;  . LAPAROSCOPY N/A 01/10/2014   Procedure: LAPAROSCOPY OPERATIVE;  Surgeon: Ena Dawley, MD;  Location: [facility];  Service: Gynecology;  Laterality: N/A;  . TUBAL LIGATION    . UMBILICAL HERNIA REPAIR  01/10/2014   Procedure: HERNIA REPAIR UMBILICAL ADULT;  Surgeon: Ena Dawley, MD;  Location: [facility];  Service: Gynecology;;  . Arnetha Courser TOOTH EXTRACTION      SOCIAL HISTORY: Social History   Socioeconomic History  . Marital status: Single    Spouse name: Not on file  . Number of children: 4  . Years of education: Not on file  . Highest education level: Not on file  Occupational History  . Occupation: [employer]  Social Needs  . Financial resource strain: Not on file  . Food insecurity:    Worry: Not on file    Inability: Not on file  . Transportation needs:    Medical: Not on file    Non-medical: Not on file  Tobacco Use  . Smoking status: Never  Smoker  . Smokeless tobacco: Never Used  Substance and Sexual Activity  . Alcohol use: No    Alcohol/week: 0.0 standard drinks  . Drug use: No  . Sexual activity: Not Currently    Partners: Female    Birth control/protection: Condom, Surgical  Lifestyle  . Physical activity:    Days per week: Not on file    Minutes per session: Not on file  . Stress: Not on file  Relationships  . Social connections:    Talks on phone: Not on file    Gets together: Not on file    Attends religious service: Not on file    Active member of club or organization: Not on file    Attends meetings of clubs or  organizations: Not on file    Relationship status: Not on file  . Intimate partner violence:    Fear of current or ex partner: Not on file    Emotionally abused: Not on file    Physically abused: Not on file    Forced sexual activity: Not on file  Other Topics Concern  . Not on file  Social History Narrative   Daily caffeine: 1 coffee/day   4 children, 3 at home youngest born 2006   2 grandchildren    Employed: Painting and Scientist, forensic: High school   Single          FAMILY HISTORY: Family History  Problem Relation Age of Onset  . Diabetes Mother   . Heart failure Mother   . Heart failure Father   . Diabetes Father   . Heart failure Brother   . Diabetes Brother   . Colon cancer Other        neg hx.  . Diabetes Maternal Grandmother   . Alzheimer's disease Paternal Grandmother     ALLERGIES:  is allergic to cortisone; hydrocodone-acetaminophen; and morphine.  MEDICATIONS:  Current Outpatient Medications  Medication Sig Dispense Refill  . clonazePAM (KLONOPIN) 0.5 MG tablet Take 0.5 mg by mouth daily as needed for anxiety.    . diphenhydrAMINE (BENADRYL) 25 mg capsule Take 50 mg by mouth daily as needed for allergies.    Marland Kitchen elvitegravir-cobicistat-emtricitabine-tenofovir (GENVOYA) 150-150-200-10 MG TABS tablet Take 1 tablet by mouth daily with breakfast. 30 tablet 8  . empagliflozin (JARDIANCE) 10 MG TABS tablet Take 10 mg by mouth daily.    . hydrocortisone (ANUSOL-HC) 2.5 % rectal cream Place 1 application rectally 2 (two) times daily. For hemorrhoids 30 g 1  . Insulin Glargine (TOUJEO SOLOSTAR) 300 UNIT/ML SOPN Inject 30 Units into the skin every morning.     Marland Kitchen losartan (COZAAR) 25 MG tablet Take 12.5 mg by mouth daily.    . metFORMIN (GLUCOPHAGE) 1000 MG tablet Take 1,000 mg by mouth daily.  0  . Multiple Vitamins-Minerals (MULTIVITAMIN PO) Take 1 tablet by mouth daily.    . Omega-3 Fatty Acids (FISH OIL) 1000 MG CAPS Take 1,000 mg by mouth daily.    Marland Kitchen  oxyCODONE-acetaminophen (PERCOCET) 10-325 MG tablet Take 1 tablet by mouth 4 (four) times daily as needed for pain.    . Semaglutide, 1 MG/DOSE, (OZEMPIC, 1 MG/DOSE,) 2 MG/1.5ML SOPN Inject 1 mg into the skin every Monday.    . sertraline (ZOLOFT) 100 MG tablet Take 100 mg by mouth at bedtime.    . traZODone (DESYREL) 100 MG tablet Take 100 mg by mouth at bedtime.    . VOLTAREN 1 % GEL Apply 2 g topically 4 (four) times  daily as needed (for knee pain).      No current facility-administered medications for this visit.     REVIEW OF SYSTEMS:   Constitutional: Denies fevers, chills or abnormal night sweats Eyes: Denies blurriness of vision, double vision or watery eyes Ears, nose, mouth, throat, and face: Denies mucositis or sore throat Respiratory: Denies cough, dyspnea or wheezes Cardiovascular: Denies palpitation, chest discomfort or lower extremity swelling Gastrointestinal:  Denies nausea, heartburn or change in bowel habits GYN: (+) abnormally heavy menstrual periods  Skin: Denies abnormal skin rashes Lymphatics: Denies new lymphadenopathy or easy bruising Neurological:Denies numbness, tingling or new weaknesses Behavioral/Psych: Mood is stable, no new changes  All other systems were reviewed with the patient and are negative.  PHYSICAL EXAMINATION: ECOG PERFORMANCE STATUS: 1 - Symptomatic but completely ambulatory  Vitals:   06/22/18 1551  BP: (!) 160/86  Pulse: 91  Resp: 18  Temp: 98.4 F (36.9 C)  SpO2: 98%   Filed Weights   06/22/18 1551  Weight: 216 lb 12.8 oz (98.3 kg)    GENERAL:alert, no distress and comfortable SKIN: skin color, texture, turgor are normal, no rashes or significant lesions EYES: normal, conjunctiva are pink and non-injected, sclera clear OROPHARYNX:no exudate, no erythema and lips, buccal mucosa, and tongue normal  NECK: supple, thyroid normal size, non-tender, without nodularity LYMPH:  no palpable lymphadenopathy in the cervical, axillary or  inguinal LUNGS: clear to auscultation and percussion with normal breathing effort HEART: regular rate & rhythm and no murmurs and no lower extremity edema ABDOMEN:abdomen soft, non-tender and normal bowel sounds Musculoskeletal:no cyanosis of digits and no clubbing  PSYCH: alert & oriented x 3 with fluent speech NEURO: no focal motor/sensory deficits  LABORATORY DATA:  I have reviewed the data as listed Lab Results  Component Value Date   WBC 7.2 06/22/2018   HGB 8.8 (L) 06/22/2018   HCT 30.7 (L) 06/22/2018   MCV 70.3 (L) 06/22/2018   PLT 400 06/22/2018   Lab Results  Component Value Date   NA 138 05/19/2018   K 3.9 05/19/2018   CL 102 05/19/2018   CO2 26 05/19/2018    RADIOGRAPHIC STUDIES: I have personally reviewed the radiological reports and agreed with the findings in the report.  ASSESSMENT AND PLAN:  Iron deficiency anemia due to chronic blood loss - hemorhoids and menses Iron deficiency anemia due to chronic blood loss I recommended performing iron studies.  We also discussed the iron absorption pathway and that there could be a potential malabsorption issue as well.  Patient does not have any signs and symptoms of celiac disease.  I reviewed the iron studies with the patient and recommended that she receive IV iron therapy. Previous reaction to IV iron.  I recommended that we use Injectafer 2 doses 1 week apart.  Return to clinic in 3 months with labs done ahead of time and follow-up. IV iron treatments will be scheduled within the next week.   All questions were answered. The patient knows to call the clinic with any problems, questions or concerns.  Nicholas Lose, MD  06/23/2018   I, Cloyde Reams Dorshimer, am acting as scribe for Nicholas Lose, MD.  I have reviewed the above documentation for accuracy and completeness, and I agree with the above.

## 2018-06-23 LAB — FERRITIN

## 2018-06-23 LAB — IRON AND TIBC
Iron: 17 ug/dL — ABNORMAL LOW (ref 41–142)
Saturation Ratios: 5 % — ABNORMAL LOW (ref 21–57)
TIBC: 378 ug/dL (ref 236–444)
UIBC: 360 ug/dL (ref 120–384)

## 2018-06-23 NOTE — Assessment & Plan Note (Signed)
Iron deficiency anemia due to chronic blood loss I recommended performing iron studies. I reviewed the iron studies with the patient and recommended that she receive IV iron therapy. Return to clinic in 3 months with labs done ahead of time and follow-up. IV iron treatments will be scheduled within the next week.

## 2018-06-26 ENCOUNTER — Ambulatory Visit (AMBULATORY_SURGERY_CENTER): Payer: Self-pay

## 2018-06-26 VITALS — Ht 69.0 in | Wt 220.4 lb

## 2018-06-26 DIAGNOSIS — Z79899 Other long term (current) drug therapy: Secondary | ICD-10-CM | POA: Diagnosis not present

## 2018-06-26 DIAGNOSIS — D500 Iron deficiency anemia secondary to blood loss (chronic): Secondary | ICD-10-CM

## 2018-06-26 MED ORDER — NA SULFATE-K SULFATE-MG SULF 17.5-3.13-1.6 GM/177ML PO SOLN: 1.0000 | Freq: Once | ORAL | 0 refills | Status: AC

## 2018-06-26 NOTE — Progress Notes (Signed)
Per pt, no allergies to soy or egg products.Pt not taking any weight loss meds or using  O2 at home.  Pt refused emmi video. 

## 2018-06-27 ENCOUNTER — Telehealth: Payer: Self-pay | Admitting: Hematology and Oncology

## 2018-06-27 NOTE — Telephone Encounter (Signed)
Called patient per 1/17 sch message - pt is aware of appt and will get an updated schedule when she comes in tomorrow .

## 2018-06-28 ENCOUNTER — Inpatient Hospital Stay: Payer: Medicaid Other

## 2018-06-28 VITALS — BP 103/65 | HR 84 | Temp 98.1°F | Resp 17

## 2018-06-28 DIAGNOSIS — D500 Iron deficiency anemia secondary to blood loss (chronic): Secondary | ICD-10-CM

## 2018-06-28 DIAGNOSIS — D509 Iron deficiency anemia, unspecified: Secondary | ICD-10-CM | POA: Diagnosis not present

## 2018-06-28 MED ORDER — SODIUM CHLORIDE 0.9 % IV SOLN
750.0000 mg | Freq: Once | INTRAVENOUS | Status: AC
  Administered 2018-06-28: 750 mg via INTRAVENOUS
  Filled 2018-06-28: qty 15

## 2018-06-28 MED ORDER — SODIUM CHLORIDE 0.9 % IV SOLN
Freq: Once | INTRAVENOUS | Status: AC
  Administered 2018-06-28: 09:00:00 via INTRAVENOUS
  Filled 2018-06-28: qty 250

## 2018-06-28 NOTE — Patient Instructions (Signed)

## 2018-07-06 ENCOUNTER — Inpatient Hospital Stay: Payer: Medicaid Other

## 2018-07-06 VITALS — BP 116/78 | HR 82 | Temp 98.0°F | Resp 18

## 2018-07-06 DIAGNOSIS — D509 Iron deficiency anemia, unspecified: Secondary | ICD-10-CM | POA: Diagnosis not present

## 2018-07-06 DIAGNOSIS — D500 Iron deficiency anemia secondary to blood loss (chronic): Secondary | ICD-10-CM

## 2018-07-06 MED ORDER — SODIUM CHLORIDE 0.9 % IV SOLN
750.0000 mg | Freq: Once | INTRAVENOUS | Status: AC
  Administered 2018-07-06: 750 mg via INTRAVENOUS
  Filled 2018-07-06: qty 15

## 2018-07-06 MED ORDER — SODIUM CHLORIDE 0.9 % IV SOLN
Freq: Once | INTRAVENOUS | Status: AC
  Administered 2018-07-06: 10:00:00 via INTRAVENOUS
  Filled 2018-07-06: qty 250

## 2018-07-06 NOTE — Progress Notes (Signed)
Pt received IV iron, tolerated well.  No reaction.  Pt stayed for full 30 min post observation period.  VSS.  A&Ox4 and ambulatory w/steady gait to exit with daughter/granddaughter and belongings.  Verbalized understanding of d/c instructions.

## 2018-07-06 NOTE — Patient Instructions (Signed)

## 2018-07-10 ENCOUNTER — Encounter: Payer: Medicaid Other | Admitting: Internal Medicine

## 2018-07-12 DIAGNOSIS — E1165 Type 2 diabetes mellitus with hyperglycemia: Secondary | ICD-10-CM | POA: Diagnosis not present

## 2018-07-12 DIAGNOSIS — R51 Headache: Secondary | ICD-10-CM | POA: Diagnosis not present

## 2018-07-12 DIAGNOSIS — R42 Dizziness and giddiness: Secondary | ICD-10-CM | POA: Diagnosis not present

## 2018-07-12 DIAGNOSIS — R110 Nausea: Secondary | ICD-10-CM | POA: Diagnosis not present

## 2018-07-18 MED FILL — GENVOYA TABLET: 150-150-200 | 30 days supply | Qty: 30 | Fill #5

## 2018-07-19 DIAGNOSIS — G4700 Insomnia, unspecified: Secondary | ICD-10-CM | POA: Diagnosis not present

## 2018-07-19 DIAGNOSIS — F329 Major depressive disorder, single episode, unspecified: Secondary | ICD-10-CM | POA: Diagnosis not present

## 2018-07-19 DIAGNOSIS — F419 Anxiety disorder, unspecified: Secondary | ICD-10-CM | POA: Diagnosis not present

## 2018-07-20 ENCOUNTER — Other Ambulatory Visit: Payer: Medicaid Other

## 2018-07-26 ENCOUNTER — Encounter: Payer: Self-pay | Admitting: Internal Medicine

## 2018-07-26 ENCOUNTER — Ambulatory Visit (AMBULATORY_SURGERY_CENTER): Payer: Medicaid Other | Admitting: Internal Medicine

## 2018-07-26 VITALS — BP 136/78 | HR 83 | Temp 98.4°F | Resp 10 | Ht 69.0 in | Wt 220.0 lb

## 2018-07-26 DIAGNOSIS — I10 Essential (primary) hypertension: Secondary | ICD-10-CM | POA: Diagnosis not present

## 2018-07-26 DIAGNOSIS — D509 Iron deficiency anemia, unspecified: Secondary | ICD-10-CM | POA: Diagnosis not present

## 2018-07-26 DIAGNOSIS — E119 Type 2 diabetes mellitus without complications: Secondary | ICD-10-CM | POA: Diagnosis not present

## 2018-07-26 DIAGNOSIS — D500 Iron deficiency anemia secondary to blood loss (chronic): Secondary | ICD-10-CM

## 2018-07-26 MED ORDER — SODIUM CHLORIDE 0.9 % IV SOLN: 500.0000 mL | Freq: Once | INTRAVENOUS | Status: DC

## 2018-07-26 MED ORDER — HYDROCORTISONE 2.5 % RE CREA: 1.0000 "application " | TOPICAL_CREAM | Freq: Two times a day (BID) | RECTAL | 1 refills | Status: DC

## 2018-07-26 NOTE — Progress Notes (Signed)
Pt's states no medical or surgical changes since previsit or office visit. 

## 2018-07-26 NOTE — Progress Notes (Signed)
Report to PACU, RN, vss, BBS= Clear.  

## 2018-07-26 NOTE — Patient Instructions (Addendum)
   I did not see any other bleeding problem except the hemorrhoids.  I am going to refer you to a surgeon to see what they think about treating the hemorrhoids.  I appreciate the opportunity to care for you. Gatha Mayer, MD, FACG   YOU HAD AN ENDOSCOPIC PROCEDURE TODAY AT [facility]:   Refer to the procedure report that was given to you for any specific questions about what was found during the examination.  If the procedure report does not answer your questions, please call your gastroenterologist to clarify.  If you requested that your care partner not be given the details of your procedure findings, then the procedure report has been included in a sealed envelope for you to review at your convenience later.  YOU SHOULD EXPECT: Some feelings of bloating in the abdomen. Passage of more gas than usual.  Walking can help get rid of the air that was put into your GI tract during the procedure and reduce the bloating. If you had a lower endoscopy (such as a colonoscopy or flexible sigmoidoscopy) you may notice spotting of blood in your stool or on the toilet paper. If you underwent a bowel prep for your procedure, you may not have a normal bowel movement for a few days.  Please Note:  You might notice some irritation and congestion in your nose or some drainage.  This is from the oxygen used during your procedure.  There is no need for concern and it should clear up in a day or so.  SYMPTOMS TO REPORT IMMEDIATELY:   Following lower endoscopy (colonoscopy or flexible sigmoidoscopy):  Excessive amounts of blood in the stool  Significant tenderness or worsening of abdominal pains  Swelling of the abdomen that is new, acute  Fever of 100F or higher    For urgent or emergent issues, a gastroenterologist can be reached at any hour by calling [phone].   DIET:  We do recommend a small meal at first, but then you may proceed to your regular diet.  Drink plenty of  fluids but you should avoid alcoholic beverages for 24 hours.  ACTIVITY:  You should plan to take it easy for the rest of today and you should NOT DRIVE or use heavy machinery until tomorrow (because of the sedation medicines used during the test).    FOLLOW UP: Our staff will call the number listed on your records the next business day following your procedure to check on you and address any questions or concerns that you may have regarding the information given to you following your procedure. If we do not reach you, we will leave a message.  However, if you are feeling well and you are not experiencing any problems, there is no need to return our call.  We will assume that you have returned to your regular daily activities without incident.  If any biopsies were taken you will be contacted by phone or by letter within the next 1-3 weeks.  Please call us at [phone] if you have not heard about the biopsies in 3 weeks.    SIGNATURES/CONFIDENTIALITY: You and/or your care partner have signed paperwork which will be entered into your electronic medical record.  These signatures attest to the fact that that the information above on your After Visit Summary has been reviewed and is understood.  Full responsibility of the confidentiality of this discharge information lies with you and/or your care-partner.

## 2018-07-26 NOTE — Op Note (Signed)
[facility] Patient Name: Brenda [name] Procedure Date: 07/26/2018 11:14 AM MRN: [id number] Endoscopist: Gatha Mayer , MD Age: 48 Referring MD:  Date of Birth: [birth year] Gender: Female Account #: [id number] Procedure:                Colonoscopy Indications:              Rectal bleeding Medicines:                Propofol per Anesthesia, Monitored Anesthesia Care Procedure:                Pre-Anesthesia Assessment:                           - Prior to the procedure, a History and Physical                            was performed, and patient medications and                            allergies were reviewed. The patient's tolerance of                            previous anesthesia was also reviewed. The risks                            and benefits of the procedure and the sedation                            options and risks were discussed with the patient.                            All questions were answered, and informed consent                            was obtained. Prior Anticoagulants: The patient has                            taken no previous anticoagulant or antiplatelet                            agents. ASA Grade Assessment: III - A patient with                            severe systemic disease. After reviewing the risks                            and benefits, the patient was deemed in                            satisfactory condition to undergo the procedure.                           After obtaining informed consent, the colonoscope  was passed under direct vision. Throughout the                            procedure, the patient's blood pressure, pulse, and                            oxygen saturations were monitored continuously. The                            Colonoscope was introduced through the anus and                            advanced to the the cecum, identified by                            appendiceal orifice and  ileocecal valve. The                            colonoscopy was performed without difficulty. The                            patient tolerated the procedure well. The quality                            of the bowel preparation was fair. The ileocecal                            valve, appendiceal orifice, and rectum were                            photographed. The bowel preparation used was                            Miralax. Scope In: 11:25:32 AM Scope Out: 11:37:49 AM Scope Withdrawal Time: 0 hours 9 minutes 33 seconds  Total Procedure Duration: 0 hours 12 minutes 17 seconds  Findings:                 The perianal and digital rectal examinations were                            normal.                           External and internal hemorrhoids were found during                            retroflexion.                           The exam was otherwise without abnormality on                            direct and retroflexion views. Complications:            No immediate complications. Estimated Blood Loss:     Estimated  blood loss: none. Impression:               - Preparation of the colon was fair. Scattered                            vegetable material and fluid. Much of colon seen.                            Enough that I think her known hemorrhoids are                            causing her bleeding.                           - External and internal hemorrhoids.                           - The examination was otherwise normal on direct                            and retroflexion views.                           - No specimens collected. Recommendation:           - Patient has a contact number available for                            emergencies. The signs and symptoms of potential                            delayed complications were discussed with the                            patient. Return to normal activities tomorrow.                            Written discharge instructions  were provided to the                            patient.                           - Resume previous diet.                           - Continue present medications.                           - Repeat colonoscopy in 2 years for screening                            purposes. She had a negative colonoscopy except                            hemorrhoids 2012 w/ adequate prep so will keep that  existing recall. She will need different or extra                            prep and better dietary fiber restriction in future.                           - Refer to [facility] colorectal surgeons re: bleeding                            hemorrhoids, evaluate for therapy. She did not                            tolerate banding in past. Gatha Mayer, MD 07/26/2018 11:49:31 AM This report has been signed electronically.

## 2018-07-27 ENCOUNTER — Telehealth: Payer: Self-pay | Admitting: *Deleted

## 2018-07-27 DIAGNOSIS — G8929 Other chronic pain: Secondary | ICD-10-CM | POA: Diagnosis not present

## 2018-07-27 DIAGNOSIS — J069 Acute upper respiratory infection, unspecified: Secondary | ICD-10-CM | POA: Diagnosis not present

## 2018-07-27 DIAGNOSIS — Z79899 Other long term (current) drug therapy: Secondary | ICD-10-CM | POA: Diagnosis not present

## 2018-07-27 DIAGNOSIS — M545 Low back pain: Secondary | ICD-10-CM | POA: Diagnosis not present

## 2018-07-27 NOTE — Telephone Encounter (Signed)
Called patient back with Dr Celesta Aver recommendations. Informed the patient to go to the ED related to inability to walk. Informed the patient that the rectal bleeding is chronic and patient agreed. Patient stating she is on the way to her PCP now.

## 2018-07-27 NOTE — Telephone Encounter (Signed)
  Follow up Call-  Call back number 07/26/2018  Post procedure Call Back phone  # [phone]  Permission to leave phone message Yes  Some recent data might be hidden     Patient questions:  Do you have a fever, pain , or abdominal swelling? Yes.   Pain Score  10 *  Have you tolerated food without any problems? Yes.    Have you been able to return to your normal activities? No.  Do you have any questions about your discharge instructions: Diet   No. Medications  No. Follow up visit  No.  Do you have questions or concerns about your Care? No.  Actions: * If pain score is 4 or above: Physician/ provider Notified : Silvano Rusk, MD.  Patient stating she is having 10/10 lower back pain , with an inability to walk this am. Patient stating this is a new occurrence, some lower back pain yesterday, but, the pain today is too severe to walk. Patient also stating she is experiencing rectal bleeding, less than 1/4 of a cup of blood. Note routed to Dr Carlean Purl.

## 2018-07-27 NOTE — Telephone Encounter (Signed)
If she is unable to walk then needs to go to ED  Rectal bleeding is chronic

## 2018-07-31 ENCOUNTER — Telehealth: Payer: Self-pay

## 2018-07-31 DIAGNOSIS — H52223 Regular astigmatism, bilateral: Secondary | ICD-10-CM | POA: Diagnosis not present

## 2018-07-31 DIAGNOSIS — H5213 Myopia, bilateral: Secondary | ICD-10-CM | POA: Diagnosis not present

## 2018-07-31 NOTE — Telephone Encounter (Signed)
Per procedure note 07/26/18 she needs a referral to [facility] for hemorrhoids.  Patient has Medicaid and will need the referral to come from her primary care.   Left message for patient to call back I spoke with Nira Conn advised that patient will need to be referred to a surgeon and due to her Medicaid I can't make the referral.  She asked that I send the information to [phone] attn Beryle Lathe.

## 2018-08-01 NOTE — Telephone Encounter (Signed)
Left message for patient to call back  

## 2018-08-02 NOTE — Telephone Encounter (Signed)
Patient advised to follow up with Dr. Luis Abed office for referral. She verbalized understanding

## 2018-08-07 ENCOUNTER — Other Ambulatory Visit: Payer: Self-pay

## 2018-08-07 ENCOUNTER — Ambulatory Visit (INDEPENDENT_AMBULATORY_CARE_PROVIDER_SITE_OTHER): Payer: Medicaid Other | Admitting: Infectious Diseases

## 2018-08-07 VITALS — Ht 68.0 in | Wt 221.0 lb

## 2018-08-07 DIAGNOSIS — E1165 Type 2 diabetes mellitus with hyperglycemia: Secondary | ICD-10-CM | POA: Diagnosis not present

## 2018-08-07 DIAGNOSIS — B20 Human immunodeficiency virus [HIV] disease: Secondary | ICD-10-CM | POA: Diagnosis not present

## 2018-08-07 DIAGNOSIS — Z113 Encounter for screening for infections with a predominantly sexual mode of transmission: Secondary | ICD-10-CM | POA: Diagnosis not present

## 2018-08-07 DIAGNOSIS — IMO0002 Reserved for concepts with insufficient information to code with codable children: Secondary | ICD-10-CM

## 2018-08-07 DIAGNOSIS — R109 Unspecified abdominal pain: Secondary | ICD-10-CM | POA: Diagnosis not present

## 2018-08-07 DIAGNOSIS — Z79899 Other long term (current) drug therapy: Secondary | ICD-10-CM

## 2018-08-07 DIAGNOSIS — K642 Third degree hemorrhoids: Secondary | ICD-10-CM

## 2018-08-07 DIAGNOSIS — D500 Iron deficiency anemia secondary to blood loss (chronic): Secondary | ICD-10-CM | POA: Diagnosis not present

## 2018-08-07 DIAGNOSIS — R21 Rash and other nonspecific skin eruption: Secondary | ICD-10-CM

## 2018-08-07 DIAGNOSIS — Z794 Long term (current) use of insulin: Secondary | ICD-10-CM | POA: Diagnosis not present

## 2018-08-07 NOTE — Addendum Note (Signed)
Addended by: Quenisha Lovins C on: 08/07/2018 12:00 PM   Modules accepted: Orders

## 2018-08-07 NOTE — Progress Notes (Signed)
   Subjective:    Patient ID: Brenda [name], female    DOB: 18-Feb-1971, 48 y.o.   MRN: [id number]  HPI 48 yo F with hx of HIV+, DM2 (on insulin). She has ongoing (GI and Heme) eval for anemia. She had colon 2-19 (hemerrhoids). Has surgery referral.  Today she is worried about her "back breaking out...itches and burns". Has been putting anti-bacterial lotion on her back.   She also c/o kidey pain. Wants to see renal.   Some missed ART, mostly on w/e.  FSG have been ok, been taking rx.   HIV 1 RNA Quant (copies/mL)  Date Value  06/21/2017 <20 DETECTED (A)  10/14/2016 <20 NOT DETECTED  07/30/2015 <20   CD4 T Cell Abs (/uL)  Date Value  01/02/2018 500  06/21/2017 610  10/14/2016 670    Review of Systems  Constitutional: Negative for appetite change and unexpected weight change.  Gastrointestinal: Negative for constipation and diarrhea.  Genitourinary: Positive for dysuria, flank pain and frequency. Negative for difficulty urinating.  Neurological: Positive for headaches.  Psychiatric/Behavioral: Positive for sleep disturbance.  Please see HPI. All other systems reviewed and negative.      Objective:   Physical Exam Constitutional:      Appearance: She is obese.  HENT:     Mouth/Throat:     Mouth: Mucous membranes are moist.     Pharynx: No oropharyngeal exudate.  Eyes:     Extraocular Movements: Extraocular movements intact.     Pupils: Pupils are equal, round, and reactive to light.  Neck:     Musculoskeletal: Neck supple. No neck rigidity or muscular tenderness.  Cardiovascular:     Rate and Rhythm: Normal rate and regular rhythm.  Pulmonary:     Effort: Pulmonary effort is normal.     Breath sounds: Normal breath sounds.  Abdominal:     General: Bowel sounds are normal.     Palpations: Abdomen is soft.     Tenderness: There is no abdominal tenderness.  Musculoskeletal:        General: No swelling.  Neurological:     General: No focal deficit  present.     Mental Status: She is alert and oriented to person, place, and time.     Sensory: No sensory deficit.  Psychiatric:        Mood and Affect: Mood normal.        Behavior: Behavior normal.          Assessment & Plan:

## 2018-08-07 NOTE — Assessment & Plan Note (Signed)
Appreciate GI and Heme f/u She will hemerroids removed and will see if this improves.

## 2018-08-07 NOTE — Assessment & Plan Note (Signed)
Will have her seen by derm.

## 2018-08-07 NOTE — Assessment & Plan Note (Signed)
Will check her labs today.  Refuses flu vax.  Given condoms rtc in 9 months.

## 2018-08-07 NOTE — Progress Notes (Signed)
Assisted patient to lab and connected with referral coordinator for Dermatology referral.  Eugenia Mcalpine, LPN

## 2018-08-07 NOTE — Assessment & Plan Note (Signed)
Surgery pending

## 2018-08-07 NOTE — Addendum Note (Signed)
Addended by: Dolan Amen D on: 08/07/2018 11:17 AM   Modules accepted: Orders

## 2018-08-07 NOTE — Assessment & Plan Note (Signed)
Will check her A1C today She will f/u with PCP

## 2018-08-07 NOTE — Progress Notes (Signed)
Called patient and made her aware of new order for renal ultrasound. Advised patient to arrive at [facility] to complete orders during her next availability.  Patient agreed and had no question or concerns regarding new orders.  Eugenia Mcalpine, LPN

## 2018-08-07 NOTE — Assessment & Plan Note (Signed)
Will check renal u/s prior to consideration of renal eval.   Would question if this is due to -flozin.

## 2018-08-08 LAB — T-HELPER CELL (CD4) - (RCID CLINIC ONLY)
CD4 % Helper T Cell: 28 % — ABNORMAL LOW (ref 33–55)
CD4 T Cell Abs: 470 /uL (ref 400–2700)

## 2018-08-09 LAB — CBC
HCT: 34.5 % — ABNORMAL LOW (ref 35.0–45.0)
Hemoglobin: 10.7 g/dL — ABNORMAL LOW (ref 11.7–15.5)
MCH: 22.6 pg — ABNORMAL LOW (ref 27.0–33.0)
MCHC: 31 g/dL — ABNORMAL LOW (ref 32.0–36.0)
MCV: 72.9 fL — ABNORMAL LOW (ref 80.0–100.0)
MPV: 9.8 fL (ref 7.5–12.5)
Platelets: 393 10*3/uL (ref 140–400)
RBC: 4.73 10*6/uL (ref 3.80–5.10)
RDW: 23.8 % — ABNORMAL HIGH (ref 11.0–15.0)
WBC: 5.9 10*3/uL (ref 3.8–10.8)

## 2018-08-09 LAB — LIPID PANEL
Cholesterol: 217 mg/dL — ABNORMAL HIGH (ref <200)
HDL: 51 mg/dL (ref >=50)
LDL Cholesterol (Calc): 134 mg/dL (calc) — ABNORMAL HIGH
NON-HDL CHOLESTEROL (CALC): 166 mg/dL — AB (ref <130)
Total CHOL/HDL Ratio: 4.3 (calc) (ref <5.0)
Triglycerides: 187 mg/dL — ABNORMAL HIGH (ref <150)

## 2018-08-09 LAB — HEMOGLOBIN A1C
Hgb A1c MFr Bld: 7.6 % of total Hgb — ABNORMAL HIGH (ref <5.7)
Mean Plasma Glucose: 171 (calc)
eAG (mmol/L): 9.5 (calc)

## 2018-08-09 LAB — RPR: RPR: NONREACTIVE

## 2018-08-09 LAB — HIV-1 RNA QUANT-NO REFLEX-BLD
HIV 1 RNA Quant: 26 copies/mL — ABNORMAL HIGH
HIV-1 RNA Quant, Log: 1.41 Log copies/mL — ABNORMAL HIGH

## 2018-08-10 ENCOUNTER — Emergency Department (HOSPITAL_COMMUNITY): Payer: Medicaid Other

## 2018-08-10 ENCOUNTER — Emergency Department (HOSPITAL_COMMUNITY)
Admission: EM | Admit: 2018-08-10 | Discharge: 2018-08-10 | Disposition: A | Discharge status: Home / Self Care | Payer: Medicaid Other | Attending: Emergency Medicine | Admitting: Emergency Medicine

## 2018-08-10 ENCOUNTER — Encounter (HOSPITAL_COMMUNITY): Payer: Self-pay

## 2018-08-10 ENCOUNTER — Other Ambulatory Visit: Payer: Self-pay

## 2018-08-10 DIAGNOSIS — E119 Type 2 diabetes mellitus without complications: Secondary | ICD-10-CM | POA: Insufficient documentation

## 2018-08-10 DIAGNOSIS — I129 Hypertensive chronic kidney disease with stage 1 through stage 4 chronic kidney disease, or unspecified chronic kidney disease: Secondary | ICD-10-CM | POA: Insufficient documentation

## 2018-08-10 DIAGNOSIS — R05 Cough: Secondary | ICD-10-CM | POA: Diagnosis not present

## 2018-08-10 DIAGNOSIS — R070 Pain in throat: Secondary | ICD-10-CM | POA: Diagnosis not present

## 2018-08-10 DIAGNOSIS — R079 Chest pain, unspecified: Secondary | ICD-10-CM | POA: Insufficient documentation

## 2018-08-10 DIAGNOSIS — R51 Headache: Secondary | ICD-10-CM | POA: Insufficient documentation

## 2018-08-10 DIAGNOSIS — I10 Essential (primary) hypertension: Secondary | ICD-10-CM | POA: Diagnosis not present

## 2018-08-10 DIAGNOSIS — N189 Chronic kidney disease, unspecified: Secondary | ICD-10-CM | POA: Insufficient documentation

## 2018-08-10 DIAGNOSIS — R519 Headache, unspecified: Secondary | ICD-10-CM

## 2018-08-10 LAB — BASIC METABOLIC PANEL
Anion gap: 10 (ref 5–15)
BUN: 10 mg/dL (ref 6–20)
CO2: 26 mmol/L (ref 22–32)
Calcium: 9.5 mg/dL (ref 8.9–10.3)
Chloride: 103 mmol/L (ref 98–111)
Creatinine, Ser: 0.8 mg/dL (ref 0.44–1.00)
Glucose, Bld: 107 mg/dL — ABNORMAL HIGH (ref 70–99)
Potassium: 3.3 mmol/L — ABNORMAL LOW (ref 3.5–5.1)
Sodium: 139 mmol/L (ref 135–145)

## 2018-08-10 LAB — CBC
HCT: 37.6 % (ref 36.0–46.0)
Hemoglobin: 11 g/dL — ABNORMAL LOW (ref 12.0–15.0)
MCH: 22.2 pg — ABNORMAL LOW (ref 26.0–34.0)
MCHC: 29.3 g/dL — ABNORMAL LOW (ref 30.0–36.0)
MCV: 75.8 fL — ABNORMAL LOW (ref 80.0–100.0)
Platelets: 390 10*3/uL (ref 150–400)
RBC: 4.96 MIL/uL (ref 3.87–5.11)
WBC: 7.2 10*3/uL (ref 4.0–10.5)
nRBC: 0 % (ref 0.0–0.2)

## 2018-08-10 LAB — TROPONIN I: Troponin I: <0.03 ng/mL (ref <0.03)

## 2018-08-10 LAB — I-STAT BETA HCG BLOOD, ED (MC, WL, AP ONLY): I-stat hCG, quantitative: <5 m[IU]/mL (ref <5)

## 2018-08-10 MED ORDER — SODIUM CHLORIDE 0.9 % IV BOLUS
1000.0000 mL | Freq: Once | INTRAVENOUS | Status: AC
  Administered 2018-08-10: 1000 mL via INTRAVENOUS

## 2018-08-10 MED ORDER — PROCHLORPERAZINE EDISYLATE 10 MG/2ML IJ SOLN
10.0000 mg | Freq: Once | INTRAMUSCULAR | Status: AC
  Administered 2018-08-10: 10 mg via INTRAVENOUS
  Filled 2018-08-10: qty 2

## 2018-08-10 MED ORDER — PROCHLORPERAZINE MALEATE 10 MG PO TABS: 10.0000 mg | ORAL_TABLET | Freq: Two times a day (BID) | ORAL | 0 refills | Status: DC | PRN

## 2018-08-10 MED ORDER — DIPHENHYDRAMINE HCL 50 MG/ML IJ SOLN
25.0000 mg | Freq: Once | INTRAMUSCULAR | Status: AC
  Administered 2018-08-10: 25 mg via INTRAVENOUS
  Filled 2018-08-10: qty 1

## 2018-08-10 NOTE — ED Notes (Signed)
Patient verbalizes understanding of discharge instructions. Opportunity for questioning and answers were provided. Armband removed by staff, pt discharged from ED.  

## 2018-08-10 NOTE — ED Triage Notes (Signed)
Per [facility], pt from home with complaint of generalized chest pain x 3 days and headache x 1 week with light sensitivity and nausea. No cardiac hx.

## 2018-08-10 NOTE — Discharge Instructions (Addendum)
You were evaluated in the Emergency Department and after careful evaluation, we did not find any emergent condition requiring admission or further testing in the hospital.  Your tests today in the emergency department were reassuring.  You can use the medication provided as needed for headache or nausea.  Please return to the Emergency Department if you experience any worsening of your condition.  We encourage you to follow up with a primary care provider.  Thank you for allowing [place] to be a part of your care.

## 2018-08-10 NOTE — ED Provider Notes (Signed)
[facility] Emergency Department Provider Note MRN:  [id number]  Arrival date & time: 08/10/18     Chief Complaint   Chest Pain and Headache   History of Present Illness   Brenda [name] is a 48 y.o. year-old female with a history of HIV, hypertension, diabetes, CKD presenting to the ED with chief complaint of chest pain and headache.  Patient explains that she was diagnosed with pneumonia 1 to 2 weeks ago, was having persistent cough.  Began having a headache similar to prior migraines about 1 week ago, gradual onset, diffuse but worse on the left side of the head.  Worse with bright lights, worse with loud noises.  Moderate in severity.  For the past 2 days began having some discomfort in the center of her chest, described as a sharp pain, worse with movement of the arms, particularly the left shoulder.  Denies nausea or vomiting, no dizziness or diaphoresis, no shortness of breath.  Denies fever, no abdominal pain, no dysuria.  Denies numbness or weakness to the arms or legs.  Review of Systems  A complete 10 system review of systems was obtained and all systems are negative except as noted in the HPI and PMH.   Patient's Health History    Past Medical History:  Diagnosis Date  . Abdominal pain    left side  . Anxiety   . Arthritis    knees  . Chronic folliculitis   . Chronic kidney disease    per pt/medication damaged her left kidney  . Depression   . Diabetes mellitus    type 2  . Hemorrhoids   . History of blood transfusion 2015   [facility]   . HIV disease ([place])   . Hypertension   . Iron deficiency anemia due to chronic blood loss   . Irregular menstrual cycle   . 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 Migraines   . Obesity   . Pneumothorax, traumatic   . SVD (spontaneous vaginal delivery)    x 4    Past Surgical History:  Procedure Laterality Date  . CHEST TUBE INSERTION     and removal of chest tube  . COLONOSCOPY    . HEMORRHOID BANDING     . HYSTEROSCOPY W/D&C  Removal of Endometrial Polyp   Dr. Raphael Gibney 2011  . HYSTEROSCOPY W/D&C N/A 04/12/2018   Procedure: DILATATION AND CURETTAGE /HYSTEROSCOPY;  Surgeon: Ena Dawley, MD;  Location: [facility];  Service: Gynecology;  Laterality: N/A;  . LAPAROSCOPIC LYSIS OF ADHESIONS  01/10/2014   Procedure: LAPAROSCOPIC LYSIS OF ADHESIONS;  Surgeon: Ena Dawley, MD;  Location: [facility];  Service: Gynecology;;  . LAPAROSCOPY N/A 01/10/2014   Procedure: LAPAROSCOPY OPERATIVE;  Surgeon: Ena Dawley, MD;  Location: [facility];  Service: Gynecology;  Laterality: N/A;  . TUBAL LIGATION    . UMBILICAL HERNIA REPAIR  01/10/2014   Procedure: HERNIA REPAIR UMBILICAL ADULT;  Surgeon: Ena Dawley, MD;  Location: [facility];  Service: Gynecology;;  . Arnetha Courser TOOTH EXTRACTION      Family History  Problem Relation Age of Onset  . Diabetes Mother   . Heart failure Mother   . Heart failure Father   . Diabetes Father   . Heart failure Brother   . Diabetes Brother   . Colon cancer Other        neg hx.  . Diabetes Maternal Grandmother   . Alzheimer's disease Paternal Grandmother     Social History   Socioeconomic History  .  Marital status: Single    Spouse name: Not on file  . Number of children: 4  . Years of education: Not on file  . Highest education level: Not on file  Occupational History  . Occupation: [employer]  Social Needs  . Financial resource strain: Not on file  . Food insecurity:    Worry: Not on file    Inability: Not on file  . Transportation needs:    Medical: Not on file    Non-medical: Not on file  Tobacco Use  . Smoking status: Never Smoker  . Smokeless tobacco: Never Used  Substance and Sexual Activity  . Alcohol use: No    Alcohol/week: 0.0 standard drinks  . Drug use: No  . Sexual activity: Not Currently    Partners: Female    Birth control/protection: Condom, Surgical  Lifestyle  . Physical activity:    Days per week: Not on file    Minutes per session:  Not on file  . Stress: Not on file  Relationships  . Social connections:    Talks on phone: Not on file    Gets together: Not on file    Attends religious service: Not on file    Active member of club or organization: Not on file    Attends meetings of clubs or organizations: Not on file    Relationship status: Not on file  . Intimate partner violence:    Fear of current or ex partner: Not on file    Emotionally abused: Not on file    Physically abused: Not on file    Forced sexual activity: Not on file  Other Topics Concern  . Not on file  Social History Narrative   Daily caffeine: 1 coffee/day   4 children, 3 at home youngest born 2006   2 grandchildren    Employed: Painting and Scientist, forensic: High school   Single           Physical Exam  Vital Signs and Nursing Notes reviewed Vitals:   08/10/18 1615 08/10/18 1630  BP: (!) 134/96 (!) 162/100  Pulse: 78 84  Resp: 14 20  Temp:    SpO2: 100% 98%    CONSTITUTIONAL: Well-appearing, NAD NEURO:  Alert and oriented x 3, no focal deficits, no meningismus EYES:  eyes equal and reactive ENT/NECK:  no LAD, no JVD CARDIO: Regular rate, well-perfused, normal S1 and S2 PULM:  CTAB no wheezing or rhonchi GI/GU:  normal bowel sounds, non-distended, non-tender MSK/SPINE:  No gross deformities, no edema SKIN:  no rash, atraumatic PSYCH:  Appropriate speech and behavior  Diagnostic and Interventional Summary    EKG Interpretation  Date/Time:  Thursday August 10 2018 16:04:42 EST Ventricular Rate:  74 PR Interval:    QRS Duration: 101 QT Interval:  392 QTC Calculation: 435 R Axis:   45 Text Interpretation:  Sinus rhythm Borderline T wave abnormalities Confirmed by Gerlene Fee [phone]) on 08/10/2018 5:06:33 PM      Labs Reviewed  BASIC METABOLIC PANEL - Abnormal; Notable for the following components:      Result Value   Potassium 3.3 (*)    Glucose, Bld 107 (*)    All other components within normal limits  CBC  - Abnormal; Notable for the following components:   Hemoglobin 11.0 (*)    MCV 75.8 (*)    MCH 22.2 (*)    MCHC 29.3 (*)    All other components within normal limits  TROPONIN I  I-STAT BETA HCG BLOOD, ED ([facility], [facility], [facility] ONLY)    DG Chest 2 View  Final Result      Medications  diphenhydrAMINE (BENADRYL) injection 25 mg (25 mg Intravenous Given 08/10/18 1624)  prochlorperazine (COMPAZINE) injection 10 mg (10 mg Intravenous Given 08/10/18 1624)  sodium chloride 0.9 % bolus 1,000 mL (1,000 mLs Intravenous New Bag/Given 08/10/18 1624)     Procedures Critical Care  ED Course and Medical Decision Making  I have reviewed the triage vital signs and the nursing notes.  Pertinent labs & imaging results that were available during my care of the patient were reviewed by me and considered in my medical decision making (see below for details).  Considering recurrent pneumonia versus musculoskeletal chest pain in this 48 year old female with history of HIV here with atypical sharp chest pain.  Does have risk factors for cardiac disease, will screen with EKG and troponin.  PERC negative.  Migraine seems typical of her prior migraines, no neurological deficits, no meningismus, no fever here, will trial migraine cocktail.  Currently with no indication for CNS imaging.  Patient feeling much better after migraine cocktail, with near resolution of both headache and chest pain.  EKG is normal, troponin negative, labs otherwise within normal limits.  Favoring MSK chest pain with migraine, continued reassuring neurological exam, no meningismus, strict return precautions for fever.  You were evaluated in the Emergency Department and after careful evaluation, we did not find any emergent condition requiring admission or further testing in the hospital.  After the discussed management above, the patient was determined to be safe for discharge.  The patient was in agreement with this plan and all questions regarding their  care were answered.  ED return precautions were discussed and the patient will return to the ED with any significant worsening of condition.  Final Clinical Impressions(s) / ED Diagnoses     ICD-10-CM   1. Nonintractable headache, unspecified chronicity pattern, unspecified headache type R51   2. Chest pain, unspecified type R07.9     ED Discharge Orders         Ordered    prochlorperazine (COMPAZINE) 10 MG tablet  2 times daily PRN     08/10/18 1727             Maudie Flakes, MD 08/10/18 1729

## 2018-08-15 MED FILL — GENVOYA TABLET: 150-150-200 | 30 days supply | Qty: 30 | Fill #6

## 2018-08-16 ENCOUNTER — Other Ambulatory Visit: Payer: Medicaid Other

## 2018-08-18 ENCOUNTER — Other Ambulatory Visit: Payer: Medicaid Other

## 2018-08-22 DIAGNOSIS — M545 Low back pain: Secondary | ICD-10-CM | POA: Diagnosis not present

## 2018-08-22 DIAGNOSIS — Z79899 Other long term (current) drug therapy: Secondary | ICD-10-CM | POA: Diagnosis not present

## 2018-09-07 MED FILL — GENVOYA TABLET: 150-150-200 | 30 days supply | Qty: 30 | Fill #7

## 2018-09-14 NOTE — Assessment & Plan Note (Signed)
Iron deficiency anemia due to chronic blood loss Prior treatment: IV iron Feraheme May 2018, Injectafer January 2020 due to intolerance to [facility] Lab review:  Return to clinic in 6 months with labs done ahead of time and follow-up.

## 2018-09-19 DIAGNOSIS — L989 Disorder of the skin and subcutaneous tissue, unspecified: Secondary | ICD-10-CM | POA: Diagnosis not present

## 2018-09-19 DIAGNOSIS — L810 Postinflammatory hyperpigmentation: Secondary | ICD-10-CM | POA: Diagnosis not present

## 2018-09-21 DIAGNOSIS — Z79891 Long term (current) use of opiate analgesic: Secondary | ICD-10-CM | POA: Diagnosis not present

## 2018-09-25 ENCOUNTER — Telehealth: Payer: Self-pay | Admitting: Hematology and Oncology

## 2018-09-25 ENCOUNTER — Inpatient Hospital Stay: Payer: Medicaid Other | Attending: Hematology and Oncology

## 2018-09-25 DIAGNOSIS — N920 Excessive and frequent menstruation with regular cycle: Secondary | ICD-10-CM | POA: Insufficient documentation

## 2018-09-25 DIAGNOSIS — D500 Iron deficiency anemia secondary to blood loss (chronic): Secondary | ICD-10-CM | POA: Insufficient documentation

## 2018-09-25 DIAGNOSIS — K649 Unspecified hemorrhoids: Secondary | ICD-10-CM | POA: Insufficient documentation

## 2018-09-25 DIAGNOSIS — Z794 Long term (current) use of insulin: Secondary | ICD-10-CM | POA: Insufficient documentation

## 2018-09-25 DIAGNOSIS — Z79899 Other long term (current) drug therapy: Secondary | ICD-10-CM | POA: Insufficient documentation

## 2018-09-25 NOTE — Telephone Encounter (Signed)
Calling regarding upcoming Webex appointment, patient is notified and e-mail has been sent.

## 2018-09-26 NOTE — Progress Notes (Signed)
Patient Care Team: Sherald Hess., MD as PCP - General (Family Medicine) Gatha Mayer, MD as Referring Physician (Gastroenterology) Ena Dawley, MD as Consulting Physician (Obstetrics and Gynecology)  DIAGNOSIS:  Encounter Diagnosis  Name Primary?  . Iron deficiency anemia due to chronic blood loss - hemorhoids and menses    CHIEF COMPLIANT: Iron deficiency anemia  INTERVAL HISTORY: Brenda [name] is a 48 y.o. female with above-mentioned history of iron deficiency anemia due to chronic blood loss treated with IV iron therapy. Her most recent labs from 08/10/18 showed Hg 11.0. She presents today for 3 month follow-up.  She continues to have hemorrhoidal bleeding.  She has not felt any significant difference in her energy levels in spite of the iron infusion.  Today her hemoglobin is up to 11.7.  This is an improvement from 8.8-11.7 in 3 months with iron infusion.  She was able to tolerate IV iron infusion very well.  REVIEW OF SYSTEMS:   Constitutional: Denies fevers, chills or abnormal weight loss Eyes: Denies blurriness of vision Ears, nose, mouth, throat, and face: Denies mucositis or sore throat Respiratory: Denies cough, dyspnea or wheezes Cardiovascular: Denies palpitation, chest discomfort Gastrointestinal: Hemorrhoidal bleeding surgery has been postponed because of coronavirus. Skin: Denies abnormal skin rashes Lymphatics: Denies new lymphadenopathy or easy bruising Neurological:Denies numbness, tingling or new weaknesses Behavioral/Psych: Mood is stable, no new changes  Extremities: No lower extremity edema   All other systems were reviewed with the patient and are negative.  I have reviewed the past medical history, past surgical history, social history and family history with the patient and they are unchanged from previous note.  ALLERGIES:  is allergic to cortisone; hydrocodone-acetaminophen; and morphine.  MEDICATIONS:  Current Outpatient  Medications  Medication Sig Dispense Refill  . clonazePAM (KLONOPIN) 0.5 MG tablet Take 0.5 mg by mouth daily as needed for anxiety.    . diphenhydrAMINE (BENADRYL) 25 mg capsule Take 50 mg by mouth daily as needed for allergies.    Marland Kitchen elvitegravir-cobicistat-emtricitabine-tenofovir (GENVOYA) 150-150-200-10 MG TABS tablet Take 1 tablet by mouth daily with breakfast. 30 tablet 8  . empagliflozin (JARDIANCE) 10 MG TABS tablet Take 10 mg by mouth daily.    . hydrocortisone (ANUSOL-HC) 2.5 % rectal cream Place 1 application rectally 2 (two) times daily. For hemorrhoids 30 g 1  . Insulin Glargine (TOUJEO SOLOSTAR) 300 UNIT/ML SOPN Inject 30 Units into the skin every morning.     Marland Kitchen losartan (COZAAR) 25 MG tablet Take 12.5 mg by mouth daily.    . metFORMIN (GLUCOPHAGE) 1000 MG tablet Take 1,000 mg by mouth daily.  0  . Multiple Vitamins-Minerals (MULTIVITAMIN PO) Take 1 tablet by mouth daily.    . Omega-3 Fatty Acids (FISH OIL) 1000 MG CAPS Take 1,000 mg by mouth daily.    Marland Kitchen oxyCODONE-acetaminophen (PERCOCET) 10-325 MG tablet Take 1 tablet by mouth 4 (four) times daily as needed for pain.    Marland Kitchen prochlorperazine (COMPAZINE) 10 MG tablet Take 1 tablet (10 mg total) by mouth 2 (two) times daily as needed for nausea. 20 tablet 0  . Semaglutide, 1 MG/DOSE, (OZEMPIC, 1 MG/DOSE,) 2 MG/1.5ML SOPN Inject 1 mg into the skin every Monday.    . sertraline (ZOLOFT) 100 MG tablet Take 100 mg by mouth at bedtime.    . traZODone (DESYREL) 100 MG tablet Take 100 mg by mouth at bedtime.    . VOLTAREN 1 % GEL Apply 2 g topically 4 (four) times daily as needed (for  knee pain).      Current Facility-Administered Medications  Medication Dose Route Frequency Provider Last Rate Last Dose  . 0.9 %  sodium chloride infusion  500 mL Intravenous Once Gatha Mayer, MD        PHYSICAL EXAMINATION: ECOG PERFORMANCE STATUS: 1 - Symptomatic but completely ambulatory  Vitals:   09/27/18 1214  BP: 121/71  Pulse: 86  Resp: 18   Temp: 98.1 F (36.7 C)  SpO2: 98%   Filed Weights   09/27/18 1214  Weight: 223 lb (101.2 kg)    GENERAL:alert, no distress and comfortable SKIN: skin color, texture, turgor are normal, no rashes or significant lesions EYES: normal, Conjunctiva are pink and non-injected, sclera clear OROPHARYNX:no exudate, no erythema and lips, buccal mucosa, and tongue normal  NECK: supple, thyroid normal size, non-tender, without nodularity LYMPH:  no palpable lymphadenopathy in the cervical, axillary or inguinal LUNGS: clear to auscultation and percussion with normal breathing effort HEART: regular rate & rhythm and no murmurs and no lower extremity edema ABDOMEN:abdomen soft, non-tender and normal bowel sounds MUSCULOSKELETAL:no cyanosis of digits and no clubbing  NEURO: alert & oriented x 3 with fluent speech, no focal motor/sensory deficits EXTREMITIES: No lower extremity edema  LABORATORY DATA:  I have reviewed the data as listed CMP Latest Ref Rng & Units 08/10/2018 05/19/2018 05/19/2018  Glucose 70 - 99 mg/dL 107(H) 267(H) 279(H)  BUN 6 - 20 mg/dL 10 13 15   Creatinine 0.44 - 1.00 mg/dL 0.80 1.00 0.95  Sodium 135 - 145 mmol/L 139 138 137  Potassium 3.5 - 5.1 mmol/L 3.3(L) 3.9 4.0  Chloride 98 - 111 mmol/L 103 102 103  CO2 22 - 32 mmol/L 26 - 26  Calcium 8.9 - 10.3 mg/dL 9.5 - 8.8(L)  Total Protein 6.5 - 8.1 g/dL - - 7.6  Total Bilirubin 0.3 - 1.2 mg/dL - - 0.5  Alkaline Phos 38 - 126 U/L - - 106  AST 15 - 41 U/L - - 16  ALT 0 - 44 U/L - - 12    Lab Results  Component Value Date   WBC 6.1 09/27/2018   HGB 11.7 (L) 09/27/2018   HCT 37.9 09/27/2018   MCV 80.5 09/27/2018   PLT 347 09/27/2018   NEUTROABS 3.1 09/27/2018    ASSESSMENT & PLAN:  Iron deficiency anemia due to chronic blood loss - hemorhoids and menses Iron deficiency anemia due to chronic blood loss Prior treatment: IV iron Feraheme May 2018, Injectafer January 2020 due to intolerance to [place] Lab review:  Hemoglobin is 11.7 iron studies are pending  Based on the above results and ongoing issues with hemorrhoidal bleeding, I recommended a 13-month follow-up with labs.  Return to clinic in 3 months with labs done ahead of time and follow-up.     Orders Placed This Encounter  Procedures  . Ferritin    Standing Status:   Future    Number of Occurrences:   1    Standing Expiration Date:   09/27/2019  . Iron and TIBC    Standing Status:   Future    Number of Occurrences:   1    Standing Expiration Date:   09/27/2019  . CBC with Differential (Cancer Center Only)    Standing Status:   Future    Number of Occurrences:   1    Standing Expiration Date:   09/27/2019   The patient has a good understanding of the overall plan. she agrees with it. she will call with  any problems that may develop before the next visit here.   Harriette Ohara, MD 09/27/18

## 2018-09-27 ENCOUNTER — Other Ambulatory Visit: Payer: Self-pay

## 2018-09-27 ENCOUNTER — Inpatient Hospital Stay: Payer: Medicaid Other

## 2018-09-27 ENCOUNTER — Inpatient Hospital Stay (HOSPITAL_BASED_OUTPATIENT_CLINIC_OR_DEPARTMENT_OTHER): Payer: Medicaid Other | Admitting: Hematology and Oncology

## 2018-09-27 DIAGNOSIS — D500 Iron deficiency anemia secondary to blood loss (chronic): Secondary | ICD-10-CM | POA: Diagnosis not present

## 2018-09-27 DIAGNOSIS — N920 Excessive and frequent menstruation with regular cycle: Secondary | ICD-10-CM

## 2018-09-27 DIAGNOSIS — Z794 Long term (current) use of insulin: Secondary | ICD-10-CM

## 2018-09-27 DIAGNOSIS — K649 Unspecified hemorrhoids: Secondary | ICD-10-CM | POA: Diagnosis not present

## 2018-09-27 DIAGNOSIS — Z79899 Other long term (current) drug therapy: Secondary | ICD-10-CM | POA: Diagnosis not present

## 2018-09-27 LAB — IRON AND TIBC
Iron: 52 ug/dL (ref 41–142)
Saturation Ratios: 21 % (ref 21–57)
TIBC: 249 ug/dL (ref 236–444)
UIBC: 197 ug/dL (ref 120–384)

## 2018-09-27 LAB — CBC WITH DIFFERENTIAL (CANCER CENTER ONLY)
Abs Immature Granulocytes: 0.01 10*3/uL (ref 0.00–0.07)
Basophils Absolute: 0 10*3/uL (ref 0.0–0.1)
Basophils Relative: 1 %
Eosinophils Absolute: 0.3 10*3/uL (ref 0.0–0.5)
Eosinophils Relative: 4 %
HCT: 37.9 % (ref 36.0–46.0)
Hemoglobin: 11.7 g/dL — ABNORMAL LOW (ref 12.0–15.0)
Immature Granulocytes: 0 %
Lymphocytes Relative: 36 %
Lymphs Abs: 2.2 10*3/uL (ref 0.7–4.0)
MCH: 24.8 pg — ABNORMAL LOW (ref 26.0–34.0)
MCHC: 30.9 g/dL (ref 30.0–36.0)
MCV: 80.5 fL (ref 80.0–100.0)
Monocytes Absolute: 0.5 10*3/uL (ref 0.1–1.0)
Monocytes Relative: 8 %
Neutro Abs: 3.1 10*3/uL (ref 1.7–7.7)
Neutrophils Relative %: 51 %
Platelet Count: 347 10*3/uL (ref 150–400)
RBC: 4.71 MIL/uL (ref 3.87–5.11)
RDW: 17.2 % — ABNORMAL HIGH (ref 11.5–15.5)
WBC Count: 6.1 10*3/uL (ref 4.0–10.5)
nRBC: 0 % (ref 0.0–0.2)

## 2018-09-27 LAB — FERRITIN: Ferritin: 97 ng/mL (ref 11–307)

## 2018-09-28 ENCOUNTER — Telehealth: Payer: Self-pay | Admitting: Hematology and Oncology

## 2018-09-28 NOTE — Telephone Encounter (Signed)
Called regarding schedule °

## 2018-10-05 MED FILL — GENVOYA TABLET: 150-150-200 | 30 days supply | Qty: 30 | Fill #8

## 2018-10-09 DIAGNOSIS — Z79891 Long term (current) use of opiate analgesic: Secondary | ICD-10-CM | POA: Diagnosis not present

## 2018-10-11 DIAGNOSIS — F411 Generalized anxiety disorder: Secondary | ICD-10-CM | POA: Diagnosis not present

## 2018-10-11 DIAGNOSIS — S92505A Nondisplaced unspecified fracture of left lesser toe(s), initial encounter for closed fracture: Secondary | ICD-10-CM | POA: Diagnosis not present

## 2018-10-11 DIAGNOSIS — G4700 Insomnia, unspecified: Secondary | ICD-10-CM | POA: Diagnosis not present

## 2018-10-11 DIAGNOSIS — Z79899 Other long term (current) drug therapy: Secondary | ICD-10-CM | POA: Diagnosis not present

## 2018-10-12 DIAGNOSIS — M545 Low back pain: Secondary | ICD-10-CM | POA: Diagnosis not present

## 2018-10-12 DIAGNOSIS — Z79899 Other long term (current) drug therapy: Secondary | ICD-10-CM | POA: Diagnosis not present

## 2018-10-12 DIAGNOSIS — Z79891 Long term (current) use of opiate analgesic: Secondary | ICD-10-CM | POA: Diagnosis not present

## 2018-10-16 ENCOUNTER — Other Ambulatory Visit: Payer: Self-pay | Admitting: Infectious Diseases

## 2018-10-16 DIAGNOSIS — IMO0002 Reserved for concepts with insufficient information to code with codable children: Secondary | ICD-10-CM

## 2018-10-16 DIAGNOSIS — E1165 Type 2 diabetes mellitus with hyperglycemia: Secondary | ICD-10-CM

## 2018-10-17 ENCOUNTER — Other Ambulatory Visit: Payer: Self-pay | Admitting: Infectious Diseases

## 2018-10-17 DIAGNOSIS — IMO0002 Reserved for concepts with insufficient information to code with codable children: Secondary | ICD-10-CM

## 2018-10-17 DIAGNOSIS — E1165 Type 2 diabetes mellitus with hyperglycemia: Secondary | ICD-10-CM

## 2018-10-18 DIAGNOSIS — M545 Low back pain: Secondary | ICD-10-CM | POA: Diagnosis not present

## 2018-10-19 ENCOUNTER — Ambulatory Visit
Admission: RE | Admit: 2018-10-19 | Discharge: 2018-10-19 | Disposition: A | Discharge status: Home / Self Care | Payer: Medicaid Other | Source: Ambulatory Visit | Attending: Infectious Diseases | Admitting: Infectious Diseases

## 2018-10-19 ENCOUNTER — Other Ambulatory Visit: Payer: Medicaid Other

## 2018-10-19 DIAGNOSIS — M545 Low back pain: Secondary | ICD-10-CM | POA: Diagnosis not present

## 2018-10-19 DIAGNOSIS — Z79899 Other long term (current) drug therapy: Secondary | ICD-10-CM | POA: Diagnosis not present

## 2018-10-19 DIAGNOSIS — E1165 Type 2 diabetes mellitus with hyperglycemia: Secondary | ICD-10-CM

## 2018-10-19 DIAGNOSIS — IMO0002 Reserved for concepts with insufficient information to code with codable children: Secondary | ICD-10-CM

## 2018-10-24 ENCOUNTER — Ambulatory Visit (INDEPENDENT_AMBULATORY_CARE_PROVIDER_SITE_OTHER): Payer: Medicaid Other | Admitting: Infectious Diseases

## 2018-10-24 ENCOUNTER — Encounter: Payer: Self-pay | Admitting: Infectious Diseases

## 2018-10-24 ENCOUNTER — Other Ambulatory Visit: Payer: Self-pay

## 2018-10-24 VITALS — BP 114/73 | HR 94 | Temp 98.1°F | Ht 68.0 in | Wt 225.0 lb

## 2018-10-24 DIAGNOSIS — B20 Human immunodeficiency virus [HIV] disease: Secondary | ICD-10-CM | POA: Diagnosis not present

## 2018-10-24 DIAGNOSIS — Z79899 Other long term (current) drug therapy: Secondary | ICD-10-CM

## 2018-10-24 DIAGNOSIS — E1165 Type 2 diabetes mellitus with hyperglycemia: Secondary | ICD-10-CM

## 2018-10-24 DIAGNOSIS — IMO0002 Reserved for concepts with insufficient information to code with codable children: Secondary | ICD-10-CM

## 2018-10-24 DIAGNOSIS — R21 Rash and other nonspecific skin eruption: Secondary | ICD-10-CM | POA: Diagnosis not present

## 2018-10-24 DIAGNOSIS — Z113 Encounter for screening for infections with a predominantly sexual mode of transmission: Secondary | ICD-10-CM | POA: Diagnosis not present

## 2018-10-24 DIAGNOSIS — Z6837 Body mass index (BMI) 37.0-37.9, adult: Secondary | ICD-10-CM | POA: Diagnosis not present

## 2018-10-24 DIAGNOSIS — E6609 Other obesity due to excess calories: Secondary | ICD-10-CM

## 2018-10-24 DIAGNOSIS — K642 Third degree hemorrhoids: Secondary | ICD-10-CM | POA: Diagnosis not present

## 2018-10-24 DIAGNOSIS — Z794 Long term (current) use of insulin: Secondary | ICD-10-CM

## 2018-10-24 NOTE — Assessment & Plan Note (Signed)
She is doing well.  Will continue her current art.  Will see her back in 7 months.

## 2018-10-24 NOTE — Assessment & Plan Note (Signed)
Encouraged her to watch diet and exercise.

## 2018-10-24 NOTE — Progress Notes (Signed)
   Subjective:    Patient ID: Brenda [name], female    DOB: Feb 10, 1971, 48 y.o.   MRN: [id number]  HPI 48yo F with hx of HIV+ (06-1996), DM2 (on insulin). She has ongoing (GI and Heme) eval for anemia. She had colon 2-19 (hemerrhoids). Has surgery referral, pending surgery.  She has been on genvoya (?2003). Brenda [name].  She had renal u/s on 5-14 for abd pain. This showed developing medical-renal disease.   Has been feeling a little depressed, misses her freedom of being able to travel. Her 74 yo son needs a haircut.  FSG have been up and down. Has been checking.  She is still having "kidnery pain".   HIV 1 RNA Quant (copies/mL)  Date Value  08/07/2018 26 (H)  06/21/2017 <20 DETECTED (A)  10/14/2016 <20 NOT DETECTED   CD4 T Cell Abs (/uL)  Date Value  08/07/2018 470  01/02/2018 500  06/21/2017 610    Review of Systems  Constitutional: Negative for appetite change, chills, fever and unexpected weight change.  Respiratory: Negative for cough and shortness of breath.   Gastrointestinal: Negative for blood in stool, constipation and diarrhea.  Genitourinary: Negative for difficulty urinating.  wt going up, "I eat perfect". Used to go to gym.      Objective:   Physical Exam Constitutional:      Appearance: Normal appearance. She is obese.  HENT:     Mouth/Throat:     Mouth: Mucous membranes are moist.     Pharynx: No oropharyngeal exudate.  Eyes:     Extraocular Movements: Extraocular movements intact.     Pupils: Pupils are equal, round, and reactive to light.  Neck:     Musculoskeletal: Normal range of motion and neck supple.  Cardiovascular:     Rate and Rhythm: Normal rate and regular rhythm.     Heart sounds: Normal heart sounds.  Pulmonary:     Effort: Pulmonary effort is normal.     Breath sounds: Normal breath sounds.  Abdominal:     General: Bowel sounds are normal.     Tenderness: There is no abdominal tenderness.  Musculoskeletal:     Right  lower leg: No edema.     Left lower leg: No edema.  Skin:    Findings: Rash present.  Neurological:     Mental Status: She is alert.       Assessment & Plan:

## 2018-10-24 NOTE — Assessment & Plan Note (Signed)
Await resumption of elective procedures.

## 2018-10-24 NOTE — Assessment & Plan Note (Signed)
Has been seen by derm, "they didn't tell me nothing".

## 2018-10-24 NOTE — Assessment & Plan Note (Signed)
Encouraged her to watch diet, exercise.

## 2018-10-26 ENCOUNTER — Other Ambulatory Visit: Payer: Self-pay | Admitting: Infectious Diseases

## 2018-10-26 DIAGNOSIS — B20 Human immunodeficiency virus [HIV] disease: Secondary | ICD-10-CM

## 2018-11-02 DIAGNOSIS — Z79891 Long term (current) use of opiate analgesic: Secondary | ICD-10-CM | POA: Diagnosis not present

## 2018-11-02 DIAGNOSIS — Z79899 Other long term (current) drug therapy: Secondary | ICD-10-CM | POA: Diagnosis not present

## 2018-11-02 DIAGNOSIS — M545 Low back pain: Secondary | ICD-10-CM | POA: Diagnosis not present

## 2018-11-09 MED FILL — GENVOYA TABLET: 150-150-200 | 30 days supply | Qty: 30 | Fill #0

## 2018-11-21 DIAGNOSIS — Z79891 Long term (current) use of opiate analgesic: Secondary | ICD-10-CM | POA: Diagnosis not present

## 2018-12-05 DIAGNOSIS — M545 Low back pain: Secondary | ICD-10-CM | POA: Diagnosis not present

## 2018-12-05 DIAGNOSIS — Z79899 Other long term (current) drug therapy: Secondary | ICD-10-CM | POA: Diagnosis not present

## 2018-12-06 MED FILL — GENVOYA TABLET: 150-150-200 | 30 days supply | Qty: 30 | Fill #1

## 2018-12-19 DIAGNOSIS — M545 Low back pain: Secondary | ICD-10-CM | POA: Diagnosis not present

## 2018-12-19 DIAGNOSIS — Z79891 Long term (current) use of opiate analgesic: Secondary | ICD-10-CM | POA: Diagnosis not present

## 2018-12-19 DIAGNOSIS — Z79899 Other long term (current) drug therapy: Secondary | ICD-10-CM | POA: Diagnosis not present

## 2018-12-21 ENCOUNTER — Telehealth: Payer: Self-pay | Admitting: Hematology and Oncology

## 2018-12-21 NOTE — Assessment & Plan Note (Signed)
Prior treatment: IV iron Feraheme May 2018, Injectafer January 2020 due to intolerance to [place] Lab review: 09/27/2018: Ferritin 97, iron saturation 21% Hemoglobin is 11.7  12/25/2018:  Based on the above results Return to clinic in 3 months with labs done ahead of time and follow-up.

## 2018-12-21 NOTE — Telephone Encounter (Signed)
I left a message regarding video visit  °

## 2018-12-22 ENCOUNTER — Other Ambulatory Visit: Payer: Self-pay | Admitting: *Deleted

## 2018-12-22 DIAGNOSIS — D500 Iron deficiency anemia secondary to blood loss (chronic): Secondary | ICD-10-CM

## 2018-12-25 ENCOUNTER — Inpatient Hospital Stay: Payer: Medicaid Other | Attending: Hematology and Oncology

## 2018-12-26 ENCOUNTER — Telehealth: Payer: Self-pay | Admitting: Hematology and Oncology

## 2018-12-26 NOTE — Progress Notes (Signed)
Patient did not answer her cell phone in spite of multiple attempts. This encounter was created in error - please disregard.

## 2018-12-27 ENCOUNTER — Inpatient Hospital Stay: Payer: Medicaid Other | Admitting: Hematology and Oncology

## 2018-12-27 DIAGNOSIS — D500 Iron deficiency anemia secondary to blood loss (chronic): Secondary | ICD-10-CM

## 2018-12-28 DIAGNOSIS — M25511 Pain in right shoulder: Secondary | ICD-10-CM | POA: Diagnosis not present

## 2018-12-28 DIAGNOSIS — M5412 Radiculopathy, cervical region: Secondary | ICD-10-CM | POA: Diagnosis not present

## 2019-01-02 MED FILL — GENVOYA TABLET: 150-150-200 | 30 days supply | Qty: 30 | Fill #2

## 2019-01-08 DIAGNOSIS — Z79899 Other long term (current) drug therapy: Secondary | ICD-10-CM | POA: Diagnosis not present

## 2019-01-08 DIAGNOSIS — Z79891 Long term (current) use of opiate analgesic: Secondary | ICD-10-CM | POA: Diagnosis not present

## 2019-01-08 DIAGNOSIS — M545 Low back pain: Secondary | ICD-10-CM | POA: Diagnosis not present

## 2019-01-09 ENCOUNTER — Other Ambulatory Visit: Payer: Self-pay | Admitting: Family Medicine

## 2019-01-09 DIAGNOSIS — R350 Frequency of micturition: Secondary | ICD-10-CM | POA: Diagnosis not present

## 2019-01-09 DIAGNOSIS — E7800 Pure hypercholesterolemia, unspecified: Secondary | ICD-10-CM | POA: Diagnosis not present

## 2019-01-09 DIAGNOSIS — E559 Vitamin D deficiency, unspecified: Secondary | ICD-10-CM | POA: Diagnosis not present

## 2019-01-09 DIAGNOSIS — N951 Menopausal and female climacteric states: Secondary | ICD-10-CM | POA: Diagnosis not present

## 2019-01-09 DIAGNOSIS — R0602 Shortness of breath: Secondary | ICD-10-CM | POA: Diagnosis not present

## 2019-01-09 DIAGNOSIS — Z1231 Encounter for screening mammogram for malignant neoplasm of breast: Secondary | ICD-10-CM

## 2019-01-09 DIAGNOSIS — E1165 Type 2 diabetes mellitus with hyperglycemia: Secondary | ICD-10-CM | POA: Diagnosis not present

## 2019-01-09 DIAGNOSIS — Z0000 Encounter for general adult medical examination without abnormal findings: Secondary | ICD-10-CM | POA: Diagnosis not present

## 2019-01-09 DIAGNOSIS — N915 Oligomenorrhea, unspecified: Secondary | ICD-10-CM | POA: Diagnosis not present

## 2019-01-09 DIAGNOSIS — R5383 Other fatigue: Secondary | ICD-10-CM | POA: Diagnosis not present

## 2019-01-18 DIAGNOSIS — G4700 Insomnia, unspecified: Secondary | ICD-10-CM | POA: Diagnosis not present

## 2019-01-18 DIAGNOSIS — F329 Major depressive disorder, single episode, unspecified: Secondary | ICD-10-CM | POA: Diagnosis not present

## 2019-01-18 DIAGNOSIS — F411 Generalized anxiety disorder: Secondary | ICD-10-CM | POA: Diagnosis not present

## 2019-01-18 DIAGNOSIS — Z658 Other specified problems related to psychosocial circumstances: Secondary | ICD-10-CM | POA: Diagnosis not present

## 2019-01-24 DIAGNOSIS — M5412 Radiculopathy, cervical region: Secondary | ICD-10-CM | POA: Diagnosis not present

## 2019-01-29 DIAGNOSIS — E1165 Type 2 diabetes mellitus with hyperglycemia: Secondary | ICD-10-CM | POA: Diagnosis not present

## 2019-01-29 DIAGNOSIS — R0602 Shortness of breath: Secondary | ICD-10-CM | POA: Diagnosis not present

## 2019-01-29 DIAGNOSIS — R079 Chest pain, unspecified: Secondary | ICD-10-CM | POA: Diagnosis not present

## 2019-01-29 DIAGNOSIS — R9431 Abnormal electrocardiogram [ECG] [EKG]: Secondary | ICD-10-CM | POA: Diagnosis not present

## 2019-01-29 DIAGNOSIS — R0989 Other specified symptoms and signs involving the circulatory and respiratory systems: Secondary | ICD-10-CM | POA: Diagnosis not present

## 2019-01-30 ENCOUNTER — Other Ambulatory Visit: Payer: Self-pay | Admitting: Physician Assistant

## 2019-01-30 DIAGNOSIS — R9431 Abnormal electrocardiogram [ECG] [EKG]: Secondary | ICD-10-CM

## 2019-01-30 DIAGNOSIS — R079 Chest pain, unspecified: Secondary | ICD-10-CM

## 2019-01-30 DIAGNOSIS — R0602 Shortness of breath: Secondary | ICD-10-CM

## 2019-02-01 MED FILL — GENVOYA TABLET: 150-150-200 | 30 days supply | Qty: 30 | Fill #3

## 2019-02-07 ENCOUNTER — Encounter (HOSPITAL_COMMUNITY)
Admission: RE | Admit: 2019-02-07 | Discharge: 2019-02-07 | Disposition: A | Discharge status: Home / Self Care | Payer: Medicaid Other | Source: Ambulatory Visit | Attending: Physician Assistant | Admitting: Physician Assistant

## 2019-02-07 ENCOUNTER — Other Ambulatory Visit: Payer: Self-pay

## 2019-02-07 DIAGNOSIS — R0602 Shortness of breath: Secondary | ICD-10-CM

## 2019-02-07 DIAGNOSIS — R9431 Abnormal electrocardiogram [ECG] [EKG]: Secondary | ICD-10-CM | POA: Insufficient documentation

## 2019-02-07 DIAGNOSIS — R079 Chest pain, unspecified: Secondary | ICD-10-CM | POA: Insufficient documentation

## 2019-02-08 DIAGNOSIS — Z79899 Other long term (current) drug therapy: Secondary | ICD-10-CM | POA: Diagnosis not present

## 2019-02-08 DIAGNOSIS — M545 Low back pain: Secondary | ICD-10-CM | POA: Diagnosis not present

## 2019-02-08 DIAGNOSIS — Z79891 Long term (current) use of opiate analgesic: Secondary | ICD-10-CM | POA: Diagnosis not present

## 2019-02-16 ENCOUNTER — Encounter (HOSPITAL_COMMUNITY): Payer: Self-pay

## 2019-02-16 ENCOUNTER — Ambulatory Visit (HOSPITAL_COMMUNITY)
Admission: RE | Admit: 2019-02-16 | Discharge: 2019-02-16 | Disposition: A | Discharge status: Home / Self Care | Payer: Medicaid Other | Source: Ambulatory Visit | Attending: Physician Assistant | Admitting: Physician Assistant

## 2019-02-16 ENCOUNTER — Other Ambulatory Visit: Payer: Self-pay

## 2019-02-16 DIAGNOSIS — R9431 Abnormal electrocardiogram [ECG] [EKG]: Secondary | ICD-10-CM | POA: Insufficient documentation

## 2019-02-16 DIAGNOSIS — R079 Chest pain, unspecified: Secondary | ICD-10-CM | POA: Diagnosis not present

## 2019-02-16 DIAGNOSIS — M5412 Radiculopathy, cervical region: Secondary | ICD-10-CM | POA: Diagnosis not present

## 2019-02-16 DIAGNOSIS — R0602 Shortness of breath: Secondary | ICD-10-CM | POA: Insufficient documentation

## 2019-02-16 LAB — NM MYOCAR MULTI W/SPECT W/WALL MOTION / EF
Estimated workload: 1 METS
Exercise duration (min): 5 min
MPHR: 171 {beats}/min
Peak HR: 114 {beats}/min
Percent HR: 66 %
Rest HR: 87 {beats}/min

## 2019-02-16 MED ORDER — TECHNETIUM TC 99M TETROFOSMIN IV KIT
30.0000 | PACK | Freq: Once | INTRAVENOUS | Status: AC | PRN
  Administered 2019-02-16: 30 via INTRAVENOUS

## 2019-02-16 MED ORDER — REGADENOSON 0.4 MG/5ML IV SOLN
0.4000 mg | Freq: Once | INTRAVENOUS | Status: AC
  Administered 2019-02-16: 0.4 mg via INTRAVENOUS

## 2019-02-16 MED ORDER — TECHNETIUM TC 99M TETROFOSMIN IV KIT
10.0000 | PACK | Freq: Once | INTRAVENOUS | Status: AC | PRN
  Administered 2019-02-16: 10 via INTRAVENOUS

## 2019-02-16 MED ORDER — REGADENOSON 0.4 MG/5ML IV SOLN
INTRAVENOUS | Status: AC
  Filled 2019-02-16: qty 5

## 2019-02-19 DIAGNOSIS — I10 Essential (primary) hypertension: Secondary | ICD-10-CM | POA: Diagnosis not present

## 2019-02-19 DIAGNOSIS — E1165 Type 2 diabetes mellitus with hyperglycemia: Secondary | ICD-10-CM | POA: Diagnosis not present

## 2019-02-19 DIAGNOSIS — G43009 Migraine without aura, not intractable, without status migrainosus: Secondary | ICD-10-CM | POA: Diagnosis not present

## 2019-02-19 DIAGNOSIS — G43909 Migraine, unspecified, not intractable, without status migrainosus: Secondary | ICD-10-CM | POA: Diagnosis not present

## 2019-02-19 DIAGNOSIS — N951 Menopausal and female climacteric states: Secondary | ICD-10-CM | POA: Diagnosis not present

## 2019-02-19 DIAGNOSIS — N915 Oligomenorrhea, unspecified: Secondary | ICD-10-CM | POA: Diagnosis not present

## 2019-02-21 DIAGNOSIS — Z79899 Other long term (current) drug therapy: Secondary | ICD-10-CM | POA: Diagnosis not present

## 2019-02-21 DIAGNOSIS — M545 Low back pain: Secondary | ICD-10-CM | POA: Diagnosis not present

## 2019-02-21 DIAGNOSIS — Z79891 Long term (current) use of opiate analgesic: Secondary | ICD-10-CM | POA: Diagnosis not present

## 2019-02-27 DIAGNOSIS — M542 Cervicalgia: Secondary | ICD-10-CM | POA: Diagnosis not present

## 2019-03-05 MED FILL — GENVOYA TABLET: 150-150-200 | 30 days supply | Qty: 30 | Fill #4

## 2019-03-07 DIAGNOSIS — M5000 Cervical disc disorder with myelopathy, unspecified cervical region: Secondary | ICD-10-CM | POA: Diagnosis not present

## 2019-03-08 DIAGNOSIS — E119 Type 2 diabetes mellitus without complications: Secondary | ICD-10-CM | POA: Diagnosis not present

## 2019-03-08 DIAGNOSIS — R0989 Other specified symptoms and signs involving the circulatory and respiratory systems: Secondary | ICD-10-CM | POA: Diagnosis not present

## 2019-03-08 DIAGNOSIS — M5030 Other cervical disc degeneration, unspecified cervical region: Secondary | ICD-10-CM | POA: Diagnosis not present

## 2019-03-08 DIAGNOSIS — R9431 Abnormal electrocardiogram [ECG] [EKG]: Secondary | ICD-10-CM | POA: Diagnosis not present

## 2019-03-08 DIAGNOSIS — E1165 Type 2 diabetes mellitus with hyperglycemia: Secondary | ICD-10-CM | POA: Diagnosis not present

## 2019-03-20 DIAGNOSIS — R875 Abnormal microbiological findings in specimens from female genital organs: Secondary | ICD-10-CM | POA: Diagnosis not present

## 2019-03-20 DIAGNOSIS — R87610 Atypical squamous cells of undetermined significance on cytologic smear of cervix (ASC-US): Secondary | ICD-10-CM | POA: Diagnosis not present

## 2019-03-20 DIAGNOSIS — N871 Moderate cervical dysplasia: Secondary | ICD-10-CM | POA: Diagnosis not present

## 2019-03-21 DIAGNOSIS — Z658 Other specified problems related to psychosocial circumstances: Secondary | ICD-10-CM | POA: Diagnosis not present

## 2019-03-21 DIAGNOSIS — M542 Cervicalgia: Secondary | ICD-10-CM | POA: Diagnosis not present

## 2019-03-21 DIAGNOSIS — F329 Major depressive disorder, single episode, unspecified: Secondary | ICD-10-CM | POA: Diagnosis not present

## 2019-03-21 DIAGNOSIS — G4700 Insomnia, unspecified: Secondary | ICD-10-CM | POA: Diagnosis not present

## 2019-03-21 DIAGNOSIS — M5000 Cervical disc disorder with myelopathy, unspecified cervical region: Secondary | ICD-10-CM | POA: Diagnosis not present

## 2019-03-21 DIAGNOSIS — F411 Generalized anxiety disorder: Secondary | ICD-10-CM | POA: Diagnosis not present

## 2019-03-21 DIAGNOSIS — M5412 Radiculopathy, cervical region: Secondary | ICD-10-CM | POA: Diagnosis not present

## 2019-03-22 DIAGNOSIS — M545 Low back pain: Secondary | ICD-10-CM | POA: Diagnosis not present

## 2019-03-22 DIAGNOSIS — R110 Nausea: Secondary | ICD-10-CM | POA: Diagnosis not present

## 2019-03-22 DIAGNOSIS — Z79899 Other long term (current) drug therapy: Secondary | ICD-10-CM | POA: Diagnosis not present

## 2019-03-22 DIAGNOSIS — Z7189 Other specified counseling: Secondary | ICD-10-CM | POA: Diagnosis not present

## 2019-03-30 DIAGNOSIS — M5000 Cervical disc disorder with myelopathy, unspecified cervical region: Secondary | ICD-10-CM | POA: Diagnosis not present

## 2019-03-30 DIAGNOSIS — M5412 Radiculopathy, cervical region: Secondary | ICD-10-CM | POA: Diagnosis not present

## 2019-03-30 DIAGNOSIS — M542 Cervicalgia: Secondary | ICD-10-CM | POA: Diagnosis not present

## 2019-04-13 MED FILL — GENVOYA TABLET: 150-150-200 | 30 days supply | Qty: 30 | Fill #5

## 2019-04-16 DIAGNOSIS — A599 Trichomoniasis, unspecified: Secondary | ICD-10-CM | POA: Diagnosis not present

## 2019-04-20 DIAGNOSIS — M7581 Other shoulder lesions, right shoulder: Secondary | ICD-10-CM | POA: Diagnosis not present

## 2019-04-20 DIAGNOSIS — Z79891 Long term (current) use of opiate analgesic: Secondary | ICD-10-CM | POA: Diagnosis not present

## 2019-04-20 DIAGNOSIS — M5000 Cervical disc disorder with myelopathy, unspecified cervical region: Secondary | ICD-10-CM | POA: Diagnosis not present

## 2019-04-20 DIAGNOSIS — M545 Low back pain: Secondary | ICD-10-CM | POA: Diagnosis not present

## 2019-04-20 DIAGNOSIS — Z79899 Other long term (current) drug therapy: Secondary | ICD-10-CM | POA: Diagnosis not present

## 2019-04-25 DIAGNOSIS — Z21 Asymptomatic human immunodeficiency virus [HIV] infection status: Secondary | ICD-10-CM | POA: Diagnosis not present

## 2019-04-25 DIAGNOSIS — Z03818 Encounter for observation for suspected exposure to other biological agents ruled out: Secondary | ICD-10-CM | POA: Diagnosis not present

## 2019-04-25 DIAGNOSIS — I10 Essential (primary) hypertension: Secondary | ICD-10-CM | POA: Diagnosis not present

## 2019-04-25 DIAGNOSIS — Z114 Encounter for screening for human immunodeficiency virus [HIV]: Secondary | ICD-10-CM | POA: Diagnosis not present

## 2019-04-25 DIAGNOSIS — E7800 Pure hypercholesterolemia, unspecified: Secondary | ICD-10-CM | POA: Diagnosis not present

## 2019-04-25 DIAGNOSIS — E1165 Type 2 diabetes mellitus with hyperglycemia: Secondary | ICD-10-CM | POA: Diagnosis not present

## 2019-04-25 DIAGNOSIS — Z79899 Other long term (current) drug therapy: Secondary | ICD-10-CM | POA: Diagnosis not present

## 2019-04-26 ENCOUNTER — Other Ambulatory Visit: Payer: Medicaid Other

## 2019-04-26 ENCOUNTER — Other Ambulatory Visit (HOSPITAL_COMMUNITY)
Admission: RE | Admit: 2019-04-26 | Discharge: 2019-04-26 | Disposition: A | Discharge status: Home / Self Care | Payer: Medicaid Other | Source: Ambulatory Visit | Attending: Infectious Diseases | Admitting: Infectious Diseases

## 2019-04-26 ENCOUNTER — Other Ambulatory Visit: Payer: Self-pay

## 2019-04-26 DIAGNOSIS — Z113 Encounter for screening for infections with a predominantly sexual mode of transmission: Secondary | ICD-10-CM

## 2019-04-26 DIAGNOSIS — E1165 Type 2 diabetes mellitus with hyperglycemia: Secondary | ICD-10-CM

## 2019-04-26 DIAGNOSIS — IMO0002 Reserved for concepts with insufficient information to code with codable children: Secondary | ICD-10-CM

## 2019-04-26 DIAGNOSIS — B20 Human immunodeficiency virus [HIV] disease: Secondary | ICD-10-CM | POA: Diagnosis not present

## 2019-04-26 DIAGNOSIS — Z79899 Other long term (current) drug therapy: Secondary | ICD-10-CM

## 2019-04-26 DIAGNOSIS — Z794 Long term (current) use of insulin: Secondary | ICD-10-CM | POA: Diagnosis not present

## 2019-04-26 NOTE — Addendum Note (Signed)
Addended by: ABBITT, KATRINA F on: 04/26/2019 02:50 PM ° ° Modules accepted: Orders ° °

## 2019-04-27 LAB — URINE CYTOLOGY ANCILLARY ONLY
Chlamydia: NEGATIVE
Comment: NEGATIVE
Comment: NORMAL
Neisseria Gonorrhea: NEGATIVE

## 2019-04-27 LAB — T-HELPER CELL (CD4) - (RCID CLINIC ONLY)
CD4 % Helper T Cell: 39 % (ref 33–65)
CD4 T Cell Abs: 992 /uL (ref 400–1790)

## 2019-05-04 LAB — HEMOGLOBIN A1C
Hgb A1c MFr Bld: 8.8 % of total Hgb — ABNORMAL HIGH (ref <5.7)
Mean Plasma Glucose: 206 (calc)
eAG (mmol/L): 11.4 (calc)

## 2019-05-04 LAB — COMPREHENSIVE METABOLIC PANEL
AG Ratio: 1.4 (calc) (ref 1.0–2.5)
ALT: 15 U/L (ref 6–29)
AST: 13 U/L (ref 10–35)
Albumin: 4.2 g/dL (ref 3.6–5.1)
Alkaline phosphatase (APISO): 112 U/L (ref 31–125)
BUN: 14 mg/dL (ref 7–25)
CO2: 26 mmol/L (ref 20–32)
Calcium: 9.2 mg/dL (ref 8.6–10.2)
Chloride: 103 mmol/L (ref 98–110)
Creat: 0.89 mg/dL (ref 0.50–1.10)
Globulin: 2.9 g/dL (calc) (ref 1.9–3.7)
Glucose, Bld: 243 mg/dL — ABNORMAL HIGH (ref 65–99)
Potassium: 3.9 mmol/L (ref 3.5–5.3)
Sodium: 140 mmol/L (ref 135–146)
Total Bilirubin: 0.3 mg/dL (ref 0.2–1.2)
Total Protein: 7.1 g/dL (ref 6.1–8.1)

## 2019-05-04 LAB — CBC
HCT: 38.5 % (ref 35.0–45.0)
Hemoglobin: 12.1 g/dL (ref 11.7–15.5)
MCH: 25.1 pg — ABNORMAL LOW (ref 27.0–33.0)
MCHC: 31.4 g/dL — ABNORMAL LOW (ref 32.0–36.0)
MCV: 79.9 fL — ABNORMAL LOW (ref 80.0–100.0)
MPV: 10.3 fL (ref 7.5–12.5)
Platelets: 373 10*3/uL (ref 140–400)
RBC: 4.82 10*6/uL (ref 3.80–5.10)
RDW: 13.2 % (ref 11.0–15.0)
WBC: 7 10*3/uL (ref 3.8–10.8)

## 2019-05-04 LAB — HIV-1 RNA QUANT-NO REFLEX-BLD
HIV 1 RNA Quant: 25 copies/mL — ABNORMAL HIGH
HIV-1 RNA Quant, Log: 1.4 Log copies/mL — ABNORMAL HIGH

## 2019-05-04 LAB — LIPID PANEL
Cholesterol: 165 mg/dL (ref <200)
HDL: 53 mg/dL (ref >=50)
LDL Cholesterol (Calc): 87 mg/dL (calc)
Non-HDL Cholesterol (Calc): 112 mg/dL (calc) (ref <130)
Total CHOL/HDL Ratio: 3.1 (calc) (ref <5.0)
Triglycerides: 148 mg/dL (ref <150)

## 2019-05-04 LAB — RPR: RPR Ser Ql: NONREACTIVE

## 2019-05-10 ENCOUNTER — Other Ambulatory Visit: Payer: Self-pay | Admitting: Infectious Diseases

## 2019-05-10 DIAGNOSIS — B20 Human immunodeficiency virus [HIV] disease: Secondary | ICD-10-CM

## 2019-05-11 ENCOUNTER — Encounter: Payer: Medicaid Other | Admitting: Infectious Diseases

## 2019-05-11 ENCOUNTER — Ambulatory Visit: Payer: Medicaid Other | Admitting: Infectious Diseases

## 2019-05-11 MED FILL — GENVOYA TABLET: 150-150-200 | 30 days supply | Qty: 30 | Fill #0

## 2019-05-18 DIAGNOSIS — E1165 Type 2 diabetes mellitus with hyperglycemia: Secondary | ICD-10-CM | POA: Diagnosis not present

## 2019-05-18 DIAGNOSIS — Z79899 Other long term (current) drug therapy: Secondary | ICD-10-CM | POA: Diagnosis not present

## 2019-05-18 DIAGNOSIS — M545 Low back pain: Secondary | ICD-10-CM | POA: Diagnosis not present

## 2019-05-23 DIAGNOSIS — G4700 Insomnia, unspecified: Secondary | ICD-10-CM | POA: Diagnosis not present

## 2019-05-23 DIAGNOSIS — F411 Generalized anxiety disorder: Secondary | ICD-10-CM | POA: Diagnosis not present

## 2019-05-23 DIAGNOSIS — U071 COVID-19: Secondary | ICD-10-CM | POA: Diagnosis not present

## 2019-05-23 DIAGNOSIS — Z658 Other specified problems related to psychosocial circumstances: Secondary | ICD-10-CM | POA: Diagnosis not present

## 2019-05-23 DIAGNOSIS — F329 Major depressive disorder, single episode, unspecified: Secondary | ICD-10-CM | POA: Diagnosis not present

## 2019-05-24 ENCOUNTER — Other Ambulatory Visit: Payer: Self-pay

## 2019-05-24 ENCOUNTER — Ambulatory Visit: Payer: Medicaid Other | Admitting: Infectious Diseases

## 2019-05-30 DIAGNOSIS — Z20828 Contact with and (suspected) exposure to other viral communicable diseases: Secondary | ICD-10-CM | POA: Diagnosis not present

## 2019-06-11 ENCOUNTER — Other Ambulatory Visit: Payer: Self-pay

## 2019-06-12 ENCOUNTER — Ambulatory Visit: Payer: Medicaid Other | Admitting: Endocrinology

## 2019-06-12 MED FILL — GENVOYA TABLET: 150-150-200 | 30 days supply | Qty: 30 | Fill #1

## 2019-06-20 DIAGNOSIS — Z79899 Other long term (current) drug therapy: Secondary | ICD-10-CM | POA: Diagnosis not present

## 2019-06-20 DIAGNOSIS — E1165 Type 2 diabetes mellitus with hyperglycemia: Secondary | ICD-10-CM | POA: Diagnosis not present

## 2019-06-20 DIAGNOSIS — Z23 Encounter for immunization: Secondary | ICD-10-CM | POA: Diagnosis not present

## 2019-06-20 DIAGNOSIS — F329 Major depressive disorder, single episode, unspecified: Secondary | ICD-10-CM | POA: Diagnosis not present

## 2019-06-20 DIAGNOSIS — M545 Low back pain: Secondary | ICD-10-CM | POA: Diagnosis not present

## 2019-07-04 ENCOUNTER — Ambulatory Visit: Payer: Medicaid Other | Admitting: Endocrinology

## 2019-07-10 MED FILL — GENVOYA TABLET: 150-150-200 | 30 days supply | Qty: 30 | Fill #2

## 2019-07-19 DIAGNOSIS — Z658 Other specified problems related to psychosocial circumstances: Secondary | ICD-10-CM | POA: Diagnosis not present

## 2019-07-19 DIAGNOSIS — F411 Generalized anxiety disorder: Secondary | ICD-10-CM | POA: Diagnosis not present

## 2019-07-19 DIAGNOSIS — G4700 Insomnia, unspecified: Secondary | ICD-10-CM | POA: Diagnosis not present

## 2019-07-19 DIAGNOSIS — F320 Major depressive disorder, single episode, mild: Secondary | ICD-10-CM | POA: Diagnosis not present

## 2019-07-20 DIAGNOSIS — Z79899 Other long term (current) drug therapy: Secondary | ICD-10-CM | POA: Diagnosis not present

## 2019-07-20 DIAGNOSIS — M545 Low back pain: Secondary | ICD-10-CM | POA: Diagnosis not present

## 2019-07-20 DIAGNOSIS — R110 Nausea: Secondary | ICD-10-CM | POA: Diagnosis not present

## 2019-07-20 DIAGNOSIS — Z20822 Contact with and (suspected) exposure to covid-19: Secondary | ICD-10-CM | POA: Diagnosis not present

## 2019-07-25 DIAGNOSIS — E7800 Pure hypercholesterolemia, unspecified: Secondary | ICD-10-CM | POA: Diagnosis not present

## 2019-07-25 DIAGNOSIS — G43909 Migraine, unspecified, not intractable, without status migrainosus: Secondary | ICD-10-CM | POA: Diagnosis not present

## 2019-07-25 DIAGNOSIS — E559 Vitamin D deficiency, unspecified: Secondary | ICD-10-CM | POA: Diagnosis not present

## 2019-07-25 DIAGNOSIS — E039 Hypothyroidism, unspecified: Secondary | ICD-10-CM | POA: Diagnosis not present

## 2019-07-25 DIAGNOSIS — E1165 Type 2 diabetes mellitus with hyperglycemia: Secondary | ICD-10-CM | POA: Diagnosis not present

## 2019-07-25 DIAGNOSIS — Z79899 Other long term (current) drug therapy: Secondary | ICD-10-CM | POA: Diagnosis not present

## 2019-07-25 DIAGNOSIS — I10 Essential (primary) hypertension: Secondary | ICD-10-CM | POA: Diagnosis not present

## 2019-07-25 DIAGNOSIS — Z20822 Contact with and (suspected) exposure to covid-19: Secondary | ICD-10-CM | POA: Diagnosis not present

## 2019-07-27 ENCOUNTER — Ambulatory Visit: Payer: Medicaid Other | Admitting: Endocrinology

## 2019-08-03 MED FILL — GENVOYA TABLET: 150-150-200 | 30 days supply | Qty: 30 | Fill #3

## 2019-08-08 ENCOUNTER — Other Ambulatory Visit: Payer: Self-pay

## 2019-08-08 DIAGNOSIS — M9901 Segmental and somatic dysfunction of cervical region: Secondary | ICD-10-CM | POA: Diagnosis not present

## 2019-08-08 DIAGNOSIS — M9903 Segmental and somatic dysfunction of lumbar region: Secondary | ICD-10-CM | POA: Diagnosis not present

## 2019-08-08 DIAGNOSIS — M9902 Segmental and somatic dysfunction of thoracic region: Secondary | ICD-10-CM | POA: Diagnosis not present

## 2019-08-08 DIAGNOSIS — M5386 Other specified dorsopathies, lumbar region: Secondary | ICD-10-CM | POA: Diagnosis not present

## 2019-08-08 DIAGNOSIS — M5414 Radiculopathy, thoracic region: Secondary | ICD-10-CM | POA: Diagnosis not present

## 2019-08-08 DIAGNOSIS — M531 Cervicobrachial syndrome: Secondary | ICD-10-CM | POA: Diagnosis not present

## 2019-08-10 ENCOUNTER — Ambulatory Visit: Payer: Medicaid Other | Admitting: Endocrinology

## 2019-08-10 ENCOUNTER — Telehealth: Payer: Self-pay

## 2019-08-10 NOTE — Telephone Encounter (Signed)
Pt canceled 07/27/19, 07/04/19 and 06/12/19. Also NS'd 08/10/19. Routing to Dr. Loanne Drilling to determine how he would like to handle future appts.

## 2019-08-10 NOTE — Telephone Encounter (Signed)
Pt has been marked to not reschedule

## 2019-08-10 NOTE — Telephone Encounter (Signed)
No need to sched any more

## 2019-08-10 NOTE — Telephone Encounter (Signed)
Please refer to Dr. Ellison's response 

## 2019-08-14 DIAGNOSIS — M545 Low back pain: Secondary | ICD-10-CM | POA: Diagnosis not present

## 2019-08-14 DIAGNOSIS — Z79899 Other long term (current) drug therapy: Secondary | ICD-10-CM | POA: Diagnosis not present

## 2019-08-14 DIAGNOSIS — R110 Nausea: Secondary | ICD-10-CM | POA: Diagnosis not present

## 2019-09-03 DIAGNOSIS — E1165 Type 2 diabetes mellitus with hyperglycemia: Secondary | ICD-10-CM | POA: Diagnosis not present

## 2019-09-03 DIAGNOSIS — R110 Nausea: Secondary | ICD-10-CM | POA: Diagnosis not present

## 2019-09-03 DIAGNOSIS — M545 Low back pain: Secondary | ICD-10-CM | POA: Diagnosis not present

## 2019-09-05 MED FILL — GENVOYA TABLET: 150-150-200 | 30 days supply | Qty: 30 | Fill #4

## 2019-09-10 DIAGNOSIS — R110 Nausea: Secondary | ICD-10-CM | POA: Diagnosis not present

## 2019-09-10 DIAGNOSIS — Z79899 Other long term (current) drug therapy: Secondary | ICD-10-CM | POA: Diagnosis not present

## 2019-09-10 DIAGNOSIS — M545 Low back pain: Secondary | ICD-10-CM | POA: Diagnosis not present

## 2019-09-11 DIAGNOSIS — E0865 Diabetes mellitus due to underlying condition with hyperglycemia: Secondary | ICD-10-CM | POA: Diagnosis not present

## 2019-09-11 DIAGNOSIS — H2511 Age-related nuclear cataract, right eye: Secondary | ICD-10-CM | POA: Diagnosis not present

## 2019-09-11 DIAGNOSIS — H0016 Chalazion left eye, unspecified eyelid: Secondary | ICD-10-CM | POA: Diagnosis not present

## 2019-09-11 DIAGNOSIS — H18832 Recurrent erosion of cornea, left eye: Secondary | ICD-10-CM | POA: Diagnosis not present

## 2019-09-13 DIAGNOSIS — H18832 Recurrent erosion of cornea, left eye: Secondary | ICD-10-CM | POA: Diagnosis not present

## 2019-09-13 DIAGNOSIS — H0016 Chalazion left eye, unspecified eyelid: Secondary | ICD-10-CM | POA: Diagnosis not present

## 2019-09-13 DIAGNOSIS — H538 Other visual disturbances: Secondary | ICD-10-CM | POA: Diagnosis not present

## 2019-09-19 DIAGNOSIS — F411 Generalized anxiety disorder: Secondary | ICD-10-CM | POA: Diagnosis not present

## 2019-09-19 DIAGNOSIS — F324 Major depressive disorder, single episode, in partial remission: Secondary | ICD-10-CM | POA: Diagnosis not present

## 2019-09-19 DIAGNOSIS — G4700 Insomnia, unspecified: Secondary | ICD-10-CM | POA: Diagnosis not present

## 2019-09-19 DIAGNOSIS — Z658 Other specified problems related to psychosocial circumstances: Secondary | ICD-10-CM | POA: Diagnosis not present

## 2019-10-07 ENCOUNTER — Ambulatory Visit (INDEPENDENT_AMBULATORY_CARE_PROVIDER_SITE_OTHER): Payer: Medicaid Other

## 2019-10-07 ENCOUNTER — Encounter (HOSPITAL_COMMUNITY): Payer: Self-pay

## 2019-10-07 ENCOUNTER — Other Ambulatory Visit: Payer: Self-pay

## 2019-10-07 ENCOUNTER — Ambulatory Visit (HOSPITAL_COMMUNITY)
Admission: EM | Admit: 2019-10-07 | Discharge: 2019-10-07 | Disposition: A | Discharge status: Home / Self Care | Payer: Medicaid Other | Attending: Emergency Medicine | Admitting: Emergency Medicine

## 2019-10-07 DIAGNOSIS — S161XXA Strain of muscle, fascia and tendon at neck level, initial encounter: Secondary | ICD-10-CM

## 2019-10-07 DIAGNOSIS — S299XXA Unspecified injury of thorax, initial encounter: Secondary | ICD-10-CM | POA: Diagnosis not present

## 2019-10-07 DIAGNOSIS — R0789 Other chest pain: Secondary | ICD-10-CM | POA: Diagnosis not present

## 2019-10-07 MED ORDER — TIZANIDINE HCL 4 MG PO TABS
4.0000 mg | ORAL_TABLET | Freq: Four times a day (QID) | ORAL | 0 refills | Status: DC | PRN
Start: 2019-10-07 — End: 2021-09-01

## 2019-10-07 MED ORDER — CYCLOBENZAPRINE HCL 5 MG PO TABS: 5.0000 mg | ORAL_TABLET | Freq: Two times a day (BID) | ORAL | 0 refills | Status: DC | PRN

## 2019-10-07 MED ORDER — NAPROXEN 500 MG PO TABS: 500.0000 mg | ORAL_TABLET | Freq: Two times a day (BID) | ORAL | 0 refills | Status: DC

## 2019-10-07 MED ORDER — NAPROXEN 500 MG PO TABS
500.0000 mg | ORAL_TABLET | Freq: Two times a day (BID) | ORAL | 0 refills | Status: DC
Start: 2019-10-07 — End: 2021-09-15

## 2019-10-07 NOTE — Discharge Instructions (Signed)
Xray negative for fracture Pain may worsen before it gets better I expect gradual improvement over the next couple of weeks Naprosyn twice daily for the next week You may use flexeril as needed to help with pain. This is a muscle relaxer and causes sedation- please use only at bedtime or when you will be home and not have to drive/work
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
## 2019-10-08 ENCOUNTER — Telehealth (HOSPITAL_COMMUNITY): Payer: Self-pay | Admitting: Physician Assistant

## 2019-10-08 DIAGNOSIS — E1165 Type 2 diabetes mellitus with hyperglycemia: Secondary | ICD-10-CM | POA: Diagnosis not present

## 2019-10-08 DIAGNOSIS — Z21 Asymptomatic human immunodeficiency virus [HIV] infection status: Secondary | ICD-10-CM | POA: Diagnosis not present

## 2019-10-08 DIAGNOSIS — I10 Essential (primary) hypertension: Secondary | ICD-10-CM | POA: Diagnosis not present

## 2019-10-08 DIAGNOSIS — G43909 Migraine, unspecified, not intractable, without status migrainosus: Secondary | ICD-10-CM | POA: Diagnosis not present

## 2019-10-08 NOTE — Telephone Encounter (Cosign Needed)
Patient requesting work note alteration and to be written out for 3 days of work from visit on 10/07/2019. Original work note was to return with limited capacity, however patient told staff her work can not accommodate this.  She is also requesting Xray images on Disk. We will give note to return tomorrow and instructions to go to medical records for Disk.

## 2019-10-09 MED FILL — GENVOYA TABLET: 150-150-200 | 30 days supply | Qty: 30 | Fill #5

## 2019-10-15 DIAGNOSIS — S299XXA Unspecified injury of thorax, initial encounter: Secondary | ICD-10-CM | POA: Diagnosis not present

## 2019-10-15 DIAGNOSIS — Z79899 Other long term (current) drug therapy: Secondary | ICD-10-CM | POA: Diagnosis not present

## 2019-10-15 DIAGNOSIS — E118 Type 2 diabetes mellitus with unspecified complications: Secondary | ICD-10-CM | POA: Diagnosis not present

## 2019-10-15 DIAGNOSIS — M545 Low back pain: Secondary | ICD-10-CM | POA: Diagnosis not present

## 2019-11-06 ENCOUNTER — Other Ambulatory Visit: Payer: Self-pay | Admitting: Infectious Diseases

## 2019-11-06 DIAGNOSIS — B20 Human immunodeficiency virus [HIV] disease: Secondary | ICD-10-CM

## 2019-11-10 DIAGNOSIS — I10 Essential (primary) hypertension: Secondary | ICD-10-CM | POA: Diagnosis not present

## 2019-11-10 DIAGNOSIS — E559 Vitamin D deficiency, unspecified: Secondary | ICD-10-CM | POA: Diagnosis not present

## 2019-11-10 DIAGNOSIS — E1165 Type 2 diabetes mellitus with hyperglycemia: Secondary | ICD-10-CM | POA: Diagnosis not present

## 2019-11-10 DIAGNOSIS — E039 Hypothyroidism, unspecified: Secondary | ICD-10-CM | POA: Diagnosis not present

## 2019-11-10 DIAGNOSIS — Z79899 Other long term (current) drug therapy: Secondary | ICD-10-CM | POA: Diagnosis not present

## 2019-11-10 DIAGNOSIS — Z20822 Contact with and (suspected) exposure to covid-19: Secondary | ICD-10-CM | POA: Diagnosis not present

## 2019-11-10 DIAGNOSIS — G43909 Migraine, unspecified, not intractable, without status migrainosus: Secondary | ICD-10-CM | POA: Diagnosis not present

## 2019-11-10 DIAGNOSIS — E7800 Pure hypercholesterolemia, unspecified: Secondary | ICD-10-CM | POA: Diagnosis not present

## 2019-11-19 DIAGNOSIS — F411 Generalized anxiety disorder: Secondary | ICD-10-CM | POA: Diagnosis not present

## 2019-11-19 DIAGNOSIS — Z79899 Other long term (current) drug therapy: Secondary | ICD-10-CM | POA: Diagnosis not present

## 2019-11-19 DIAGNOSIS — M545 Low back pain: Secondary | ICD-10-CM | POA: Diagnosis not present

## 2019-11-23 ENCOUNTER — Ambulatory Visit (INDEPENDENT_AMBULATORY_CARE_PROVIDER_SITE_OTHER): Payer: Medicaid Other | Admitting: Infectious Diseases

## 2019-11-23 ENCOUNTER — Other Ambulatory Visit: Payer: Self-pay

## 2019-11-23 VITALS — BP 113/74 | HR 78 | Wt 224.0 lb

## 2019-11-23 DIAGNOSIS — Z794 Long term (current) use of insulin: Secondary | ICD-10-CM | POA: Diagnosis not present

## 2019-11-23 DIAGNOSIS — IMO0002 Reserved for concepts with insufficient information to code with codable children: Secondary | ICD-10-CM

## 2019-11-23 DIAGNOSIS — E1165 Type 2 diabetes mellitus with hyperglycemia: Secondary | ICD-10-CM | POA: Diagnosis not present

## 2019-11-23 DIAGNOSIS — K642 Third degree hemorrhoids: Secondary | ICD-10-CM | POA: Diagnosis not present

## 2019-11-23 DIAGNOSIS — Z113 Encounter for screening for infections with a predominantly sexual mode of transmission: Secondary | ICD-10-CM

## 2019-11-23 DIAGNOSIS — B20 Human immunodeficiency virus [HIV] disease: Secondary | ICD-10-CM | POA: Diagnosis not present

## 2019-11-23 DIAGNOSIS — Z79899 Other long term (current) drug therapy: Secondary | ICD-10-CM

## 2019-11-23 DIAGNOSIS — N870 Mild cervical dysplasia: Secondary | ICD-10-CM

## 2019-11-23 NOTE — Assessment & Plan Note (Signed)
She is doing well Offered/refused condoms.  States she gets mammo at PCP Will get her in with [facility] for her social security concerns.  Will see her back in 9 months.

## 2019-11-23 NOTE — Assessment & Plan Note (Signed)
Had GYN f/u 04-2019.  Appreciate their partnering with [place].
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
## 2019-11-23 NOTE — Assessment & Plan Note (Signed)
She has PCP f/u Appreciate their f/u (needs better control still)

## 2019-12-14 DIAGNOSIS — M545 Low back pain: Secondary | ICD-10-CM | POA: Diagnosis not present

## 2019-12-14 DIAGNOSIS — Z79899 Other long term (current) drug therapy: Secondary | ICD-10-CM | POA: Diagnosis not present

## 2019-12-14 DIAGNOSIS — F411 Generalized anxiety disorder: Secondary | ICD-10-CM | POA: Diagnosis not present

## 2019-12-15 DIAGNOSIS — R2990 Unspecified symptoms and signs involving the nervous system: Secondary | ICD-10-CM | POA: Diagnosis not present

## 2020-01-10 DIAGNOSIS — M545 Low back pain: Secondary | ICD-10-CM | POA: Diagnosis not present

## 2020-01-10 DIAGNOSIS — Z79899 Other long term (current) drug therapy: Secondary | ICD-10-CM | POA: Diagnosis not present

## 2020-01-10 DIAGNOSIS — F411 Generalized anxiety disorder: Secondary | ICD-10-CM | POA: Diagnosis not present

## 2020-01-14 ENCOUNTER — Other Ambulatory Visit: Payer: Self-pay | Admitting: Infectious Diseases

## 2020-01-14 DIAGNOSIS — B20 Human immunodeficiency virus [HIV] disease: Secondary | ICD-10-CM

## 2020-01-15 DIAGNOSIS — R110 Nausea: Secondary | ICD-10-CM | POA: Diagnosis not present

## 2020-01-15 DIAGNOSIS — Z111 Encounter for screening for respiratory tuberculosis: Secondary | ICD-10-CM | POA: Diagnosis not present

## 2020-01-15 DIAGNOSIS — M5030 Other cervical disc degeneration, unspecified cervical region: Secondary | ICD-10-CM | POA: Diagnosis not present

## 2020-01-16 DIAGNOSIS — F331 Major depressive disorder, recurrent, moderate: Secondary | ICD-10-CM | POA: Diagnosis not present

## 2020-01-16 DIAGNOSIS — Z658 Other specified problems related to psychosocial circumstances: Secondary | ICD-10-CM | POA: Diagnosis not present

## 2020-01-16 DIAGNOSIS — G4700 Insomnia, unspecified: Secondary | ICD-10-CM | POA: Diagnosis not present

## 2020-01-16 DIAGNOSIS — F411 Generalized anxiety disorder: Secondary | ICD-10-CM | POA: Diagnosis not present

## 2020-01-16 MED FILL — GENVOYA TABLET: 150-150-200 | 30 days supply | Qty: 30 | Fill #0

## 2020-02-14 DIAGNOSIS — M542 Cervicalgia: Secondary | ICD-10-CM | POA: Diagnosis not present

## 2020-02-14 DIAGNOSIS — E1165 Type 2 diabetes mellitus with hyperglycemia: Secondary | ICD-10-CM | POA: Diagnosis not present

## 2020-02-14 DIAGNOSIS — Z79899 Other long term (current) drug therapy: Secondary | ICD-10-CM | POA: Diagnosis not present

## 2020-02-14 DIAGNOSIS — F411 Generalized anxiety disorder: Secondary | ICD-10-CM | POA: Diagnosis not present

## 2020-02-14 DIAGNOSIS — M546 Pain in thoracic spine: Secondary | ICD-10-CM | POA: Diagnosis not present

## 2020-02-14 DIAGNOSIS — M545 Low back pain: Secondary | ICD-10-CM | POA: Diagnosis not present

## 2020-02-14 MED FILL — GENVOYA TABLET: 150-150-200 | 30 days supply | Qty: 30 | Fill #1

## 2020-03-13 MED FILL — GENVOYA TABLET: 150-150-200 | 30 days supply | Qty: 30 | Fill #2

## 2020-03-17 DIAGNOSIS — M545 Low back pain, unspecified: Secondary | ICD-10-CM | POA: Diagnosis not present

## 2020-03-17 DIAGNOSIS — Z79899 Other long term (current) drug therapy: Secondary | ICD-10-CM | POA: Diagnosis not present

## 2020-03-17 DIAGNOSIS — M542 Cervicalgia: Secondary | ICD-10-CM | POA: Diagnosis not present

## 2020-03-17 DIAGNOSIS — E1165 Type 2 diabetes mellitus with hyperglycemia: Secondary | ICD-10-CM | POA: Diagnosis not present

## 2020-03-17 DIAGNOSIS — M546 Pain in thoracic spine: Secondary | ICD-10-CM | POA: Diagnosis not present

## 2020-03-17 DIAGNOSIS — F411 Generalized anxiety disorder: Secondary | ICD-10-CM | POA: Diagnosis not present

## 2020-03-18 DIAGNOSIS — M542 Cervicalgia: Secondary | ICD-10-CM | POA: Diagnosis not present

## 2020-03-18 DIAGNOSIS — M170 Bilateral primary osteoarthritis of knee: Secondary | ICD-10-CM | POA: Diagnosis not present

## 2020-03-26 DIAGNOSIS — B373 Candidiasis of vulva and vagina: Secondary | ICD-10-CM | POA: Diagnosis not present

## 2020-03-26 DIAGNOSIS — Z6834 Body mass index (BMI) 34.0-34.9, adult: Secondary | ICD-10-CM | POA: Diagnosis not present

## 2020-03-26 DIAGNOSIS — R102 Pelvic and perineal pain: Secondary | ICD-10-CM | POA: Diagnosis not present

## 2020-03-26 DIAGNOSIS — R87610 Atypical squamous cells of undetermined significance on cytologic smear of cervix (ASC-US): Secondary | ICD-10-CM | POA: Diagnosis not present

## 2020-03-26 DIAGNOSIS — A630 Anogenital (venereal) warts: Secondary | ICD-10-CM | POA: Diagnosis not present

## 2020-03-26 DIAGNOSIS — N871 Moderate cervical dysplasia: Secondary | ICD-10-CM | POA: Diagnosis not present

## 2020-03-26 DIAGNOSIS — Z21 Asymptomatic human immunodeficiency virus [HIV] infection status: Secondary | ICD-10-CM | POA: Diagnosis not present

## 2020-03-26 DIAGNOSIS — Z1231 Encounter for screening mammogram for malignant neoplasm of breast: Secondary | ICD-10-CM | POA: Diagnosis not present

## 2020-03-26 DIAGNOSIS — Z0001 Encounter for general adult medical examination with abnormal findings: Secondary | ICD-10-CM | POA: Diagnosis not present

## 2020-03-27 DIAGNOSIS — F331 Major depressive disorder, recurrent, moderate: Secondary | ICD-10-CM | POA: Diagnosis not present

## 2020-03-27 DIAGNOSIS — Z658 Other specified problems related to psychosocial circumstances: Secondary | ICD-10-CM | POA: Diagnosis not present

## 2020-03-27 DIAGNOSIS — F411 Generalized anxiety disorder: Secondary | ICD-10-CM | POA: Diagnosis not present

## 2020-03-27 DIAGNOSIS — G4700 Insomnia, unspecified: Secondary | ICD-10-CM | POA: Diagnosis not present

## 2020-04-10 MED FILL — GENVOYA TABLET: 150-150-200 | 30 days supply | Qty: 30 | Fill #3

## 2020-04-16 DIAGNOSIS — E1165 Type 2 diabetes mellitus with hyperglycemia: Secondary | ICD-10-CM | POA: Diagnosis not present

## 2020-04-16 DIAGNOSIS — M545 Low back pain, unspecified: Secondary | ICD-10-CM | POA: Diagnosis not present

## 2020-04-16 DIAGNOSIS — F411 Generalized anxiety disorder: Secondary | ICD-10-CM | POA: Diagnosis not present

## 2020-04-16 DIAGNOSIS — Z79899 Other long term (current) drug therapy: Secondary | ICD-10-CM | POA: Diagnosis not present

## 2020-05-06 MED FILL — GENVOYA TABLET: 150-150-200 | 30 days supply | Qty: 30 | Fill #4

## 2020-05-07 ENCOUNTER — Other Ambulatory Visit: Payer: Self-pay

## 2020-05-07 ENCOUNTER — Other Ambulatory Visit: Payer: Self-pay | Admitting: Obstetrics and Gynecology

## 2020-05-07 NOTE — Patient Outreach (Cosign Needed Addendum)
Care Coordination - Case Manager  05/07/2020  Brenda [name] 1970-11-07 [id number]  Subjective:  Brenda [name] is an 49 y.o. year old female who is a primary patient of Royce Macadamia, Mike Gip., MD.  [name] was given information about Medicaid Managed Care team care coordination services today. Hadley Pen agreed to services and verbal consent obtained  Review of patient status, laboratory and other test data was performed as part of evaluation for provision of services.  SDOH: SDOH Screenings   Alcohol Screen:   . Last Alcohol Screening Score (AUDIT): Not on file  Depression (PHQ2-9): Low Risk   . PHQ-2 Score: 2  Financial Resource Strain:   . Difficulty of Paying Living Expenses: Not on file  Food Insecurity:   . Worried About Charity fundraiser in the Last Year: Not on file  . Ran Out of Food in the Last Year: Not on file  Housing:   . Last Housing Risk Score: Not on file  Physical Activity:   . Days of Exercise per Week: Not on file  . Minutes of Exercise per Session: Not on file  Social Connections:   . Frequency of Communication with Friends and Family: Not on file  . Frequency of Social Gatherings with Friends and Family: Not on file  . Attends Religious Services: Not on file  . Active Member of Clubs or Organizations: Not on file  . Attends Archivist Meetings: Not on file  . Marital Status: Not on file  Stress:   . Feeling of Stress : Not on file  Tobacco Use: Low Risk   . Smoking Tobacco Use: Never Smoker  . Smokeless Tobacco Use: Never Used  Transportation Needs:   . Film/video editor (Medical): Not on file  . Lack of Transportation (Non-Medical): Not on file     Objective:    Allergies  Allergen Reactions  . Cortisone Itching and Other (See Comments)    REACTION: "nerves"  . Hydrocodone-Acetaminophen Itching and Other (See Comments)    REACTION: "nerves"  . Morphine Itching and Other (See Comments)     REACTION: "nerves"    Medications:    Medications Reviewed Today    Reviewed by Carlean Purl, RN (Registered Nurse) on 11/23/19 at 1049  Med List Status: <None>  Medication Order Taking? Sig Documenting Provider Last Dose Status Informant  0.9 %  sodium chloride infusion [id number]   Gatha Mayer, MD  Active   ACCU-CHEK GUIDE test strip [id number]  3 (three) times daily. for testing [provider]  Active   atorvastatin (LIPITOR) 20 MG tablet [id number]  atorvastatin 20 mg tablet  TAKE 1 TABLET BY MOUTH ONCE DAILY [provider]  Active   Buprenorphine HCl (BELBUCA) 75 MCG FILM [id number]  Belbuca 75 mcg buccal film  USE ONE FILM BY MOUTH TWICE DAILY. USE JUST 1 AT BEDTIME FOR FIRST 4 DAYS THEN TWICE DAILY [provider]  Active   diphenhydrAMINE (BENADRYL) 25 mg capsule [id number]  Take 50 mg by mouth daily as needed for allergies. [provider]  Active Self  empagliflozin (JARDIANCE) 10 MG TABS tablet [id number]  Take 10 mg by mouth daily. [provider]  Active Self  GENVOYA 150-150-200-10 MG TABS tablet [id number] Yes TAKE 1 TABLET BY MOUTH DAILY WITH BREAKFAST. Campbell Riches, MD Taking Active   hydrocortisone (ANUSOL-HC) 2.5 % rectal cream [id number]  Place 1 application rectally 2 (two) times daily. For hemorrhoids Gatha Mayer,  MD  Active   Insulin Glargine (TOUJEO SOLOSTAR) 300 UNIT/ML SOPN [id number]  Inject 30 Units into the skin every morning.  [provider]  Active Self  linaclotide Rolan Lipa) 290 MCG CAPS capsule [id number]  Linzess 290 mcg capsule  TK 1 C PO D [provider]  Active   losartan (COZAAR) 100 MG tablet [id number]  TAKE 1 TABLET BY MOUTH ONCE DAILY THIS REPLACES LISINORIL TO PROTECT KIDNEY IN DIABETES ABOVE AND BEYOND BLOOD PRESSURE CONTROL [provider]  Active   metFORMIN (GLUCOPHAGE) 1000 MG tablet [id number]  Take 1,000 mg by mouth daily. [provider]  Active Self    metroNIDAZOLE (FLAGYL) 500 MG tablet [id number]  metronidazole 500 mg tablet  Take 1 tablet twice a day by oral route for 7 days. [provider]  Active   Multiple Vitamins-Minerals (MULTIVITAMIN PO) [id number]  Take 1 tablet by mouth daily. [provider]  Active Self  naproxen (NAPROSYN) 500 MG tablet [id number]  Take 1 tablet (500 mg total) by mouth 2 (two) times daily. Wieters, Hallie C, PA-C  Active   NOVOLOG FLEXPEN 100 UNIT/ML FlexPen [id number]  INJECT 40 UNITS SUBCUTANEOUSLY THREE TIMES DAILY [provider]  Active   Omega-3 Fatty Acids (FISH OIL) 1000 MG CAPS [id number]  Take 1,000 mg by mouth daily. [provider]  Active Self  ondansetron (ZOFRAN) 4 MG tablet [id number]  Take 4 mg by mouth 3 (three) times daily as needed. [provider]  Active   oxyCODONE-acetaminophen (PERCOCET) 10-325 MG tablet [id number]  Take 1 tablet by mouth 4 (four) times daily as needed for pain. [provider]  Active Self  predniSONE (STERAPRED UNI-PAK 21 TAB) 10 MG (21) TBPK tablet [id number]  Take 10 mg by mouth as directed. [provider]  Active   prochlorperazine (COMPAZINE) 10 MG tablet [id number]  Take 1 tablet (10 mg total) by mouth 2 (two) times daily as needed for nausea. Maudie Flakes, MD  Active   Semaglutide, 1 MG/DOSE, (OZEMPIC, 1 MG/DOSE,) 2 MG/1.5ML SOPN [id number]  Inject 1 mg into the skin every Monday. [provider]  Active Self  sertraline (ZOLOFT) 100 MG tablet [id number]  Take 100 mg by mouth at bedtime. [provider]  Active Self  tiZANidine (ZANAFLEX) 4 MG tablet [id number]  Take 1 tablet (4 mg total) by mouth every 6 (six) hours as needed for muscle spasms. Wieters, Hallie C, PA-C  Active   traZODone (DESYREL) 100 MG tablet [id number]  Take 100 mg by mouth at bedtime. [provider]  Active Self  valACYclovir (VALTREX) 500 MG tablet [id number]  TK 2 TS PO BID FOR 3 DAYS THEN TK 1 T PO QD [provider]  Active   VOLTAREN 1 % GEL [id number]  Apply 2 g topically 4 (four) times daily as needed (for knee pain).  [provider]  Active Self  VOLTAREN 1 % GEL [id number]  Apply  as directed to affected area three times a day [provider]  Active           Assessment:   Goals Addressed            This Visit's Progress   . Patient Stated       Current Barriers:  . Patient stated back pain causing a lot of discomfort.  Nurse Case Manager Clinical Goal(s):  Marland Kitchen Over the next 30 days, patient will meet with RN Care Manager to address back pain. . Over the  next 30 days, patient will work with CM team pharmacist to evaluate medications.  Interventions:  . Inter-disciplinary care team collaboration (see longitudinal plan of care) . Evaluation of current treatment plan and patient's adherence to plan as established by provider. Nash Dimmer with Pharmacist regarding medications. . Pharmacy referral for medication review.  Patient Goals/Self-Care Activities Over the next 30 days, patient will:  - speak with RNCM and Pharmacist.  Follow Up Plan: The Managed Medicaid care management team will reach out to the patient again over the next 7-14 days.           Plan: RNCM will follow up with patient within 7-14 days.  Call dropped during conversation and unable to complete assessment.  Attempted call back X 2 with no answer and unable to leave a message.

## 2020-05-14 DIAGNOSIS — E1165 Type 2 diabetes mellitus with hyperglycemia: Secondary | ICD-10-CM | POA: Diagnosis not present

## 2020-05-14 DIAGNOSIS — F411 Generalized anxiety disorder: Secondary | ICD-10-CM | POA: Diagnosis not present

## 2020-05-14 DIAGNOSIS — Z79899 Other long term (current) drug therapy: Secondary | ICD-10-CM | POA: Diagnosis not present

## 2020-05-14 DIAGNOSIS — M545 Low back pain, unspecified: Secondary | ICD-10-CM | POA: Diagnosis not present

## 2020-05-15 ENCOUNTER — Telehealth: Payer: Self-pay | Admitting: Family Medicine

## 2020-05-15 NOTE — Telephone Encounter (Signed)
I attempted to reach Brenda [name] to schedule her a phone visit with the [facility] pharmacist. Unable to leave a  Message. I will try to reach her again within the next 7-14 days.

## 2020-05-20 ENCOUNTER — Other Ambulatory Visit: Payer: Self-pay | Admitting: Obstetrics and Gynecology

## 2020-05-20 NOTE — Patient Instructions (Signed)
Hi Ms. Varma, sorry we missed you today - as a part of your Medicaid benefit, you are eligible for care management and care coordination services at no cost or copay. I was unable to reach you by phone today but would be happy to help you with your health related needs. Please feel free to call me at [phone].  A member of the Managed Medicaid care management team will reach out to you again over the next 7-14 days.   Aida Raider RN, BSN [place]  [facility] - Managed Medicaid High Risk [phone].

## 2020-05-20 NOTE — Patient Outreach (Signed)
Care Coordination  05/20/2020  Brenda [name] April 08, 1971 [id number]    Medicaid Managed Care   Unsuccessful Outreach Note  05/20/2020 Name: Brenda [name] MRN: [id number] DOB: 1971/04/23  Referred by: Sherald Hess., MD Reason for referral : High Risk Managed Medicaid (Unsuccessful telephone outreach)   A second unsuccessful telephone outreach was attempted today. The patient was referred to the case management team for assistance with care management and care coordination.   Follow Up Plan: RNCM will follow up with patient in 7-14 days.  Aida Raider RN, BSN   [facility] - Managed Medicaid High Risk [phone].

## 2020-05-21 DIAGNOSIS — Z1331 Encounter for screening for depression: Secondary | ICD-10-CM | POA: Diagnosis not present

## 2020-05-21 DIAGNOSIS — Z136 Encounter for screening for cardiovascular disorders: Secondary | ICD-10-CM | POA: Diagnosis not present

## 2020-05-21 DIAGNOSIS — Z0000 Encounter for general adult medical examination without abnormal findings: Secondary | ICD-10-CM | POA: Diagnosis not present

## 2020-05-21 DIAGNOSIS — I10 Essential (primary) hypertension: Secondary | ICD-10-CM | POA: Diagnosis not present

## 2020-05-21 DIAGNOSIS — Z20822 Contact with and (suspected) exposure to covid-19: Secondary | ICD-10-CM | POA: Diagnosis not present

## 2020-05-21 DIAGNOSIS — Z13228 Encounter for screening for other metabolic disorders: Secondary | ICD-10-CM | POA: Diagnosis not present

## 2020-05-21 DIAGNOSIS — E1165 Type 2 diabetes mellitus with hyperglycemia: Secondary | ICD-10-CM | POA: Diagnosis not present

## 2020-05-21 DIAGNOSIS — E7800 Pure hypercholesterolemia, unspecified: Secondary | ICD-10-CM | POA: Diagnosis not present

## 2020-05-21 DIAGNOSIS — Z1339 Encounter for screening examination for other mental health and behavioral disorders: Secondary | ICD-10-CM | POA: Diagnosis not present

## 2020-05-21 DIAGNOSIS — E039 Hypothyroidism, unspecified: Secondary | ICD-10-CM | POA: Diagnosis not present

## 2020-05-27 ENCOUNTER — Telehealth: Payer: Self-pay | Admitting: Family Medicine

## 2020-05-27 NOTE — Telephone Encounter (Signed)
This was my 2nd attempt to reach ms.Nelles to get her scheduled for a phone appt with the Managed Medicaid pharmacist. I left my name and number for her to return my call. I will make another attempt in the next 7-14 days.

## 2020-06-02 MED FILL — GENVOYA TABLET: 150-150-200 | 30 days supply | Qty: 30 | Fill #5

## 2020-06-04 DIAGNOSIS — Z658 Other specified problems related to psychosocial circumstances: Secondary | ICD-10-CM | POA: Diagnosis not present

## 2020-06-04 DIAGNOSIS — F331 Major depressive disorder, recurrent, moderate: Secondary | ICD-10-CM | POA: Diagnosis not present

## 2020-06-04 DIAGNOSIS — F411 Generalized anxiety disorder: Secondary | ICD-10-CM | POA: Diagnosis not present

## 2020-06-04 DIAGNOSIS — G4700 Insomnia, unspecified: Secondary | ICD-10-CM | POA: Diagnosis not present

## 2020-06-10 ENCOUNTER — Telehealth: Payer: Self-pay | Admitting: Family Medicine

## 2020-06-10 NOTE — Telephone Encounter (Signed)
I reached out to Brenda [name] to see about getting her rescheduled for the  Phone visit she missed with the [facility] RN Case Manager but she stated this was not a good time. I told her I would try back next week and she was agreeable with that plan.

## 2020-06-13 DIAGNOSIS — E1165 Type 2 diabetes mellitus with hyperglycemia: Secondary | ICD-10-CM | POA: Diagnosis not present

## 2020-06-13 DIAGNOSIS — M545 Low back pain, unspecified: Secondary | ICD-10-CM | POA: Diagnosis not present

## 2020-06-13 DIAGNOSIS — F411 Generalized anxiety disorder: Secondary | ICD-10-CM | POA: Diagnosis not present

## 2020-06-13 DIAGNOSIS — Z79899 Other long term (current) drug therapy: Secondary | ICD-10-CM | POA: Diagnosis not present

## 2020-07-01 ENCOUNTER — Other Ambulatory Visit: Payer: Self-pay | Admitting: Infectious Diseases

## 2020-07-01 DIAGNOSIS — B20 Human immunodeficiency virus [HIV] disease: Secondary | ICD-10-CM

## 2020-07-01 MED FILL — GENVOYA TABLET: 150-150-200 | 30 days supply | Qty: 30 | Fill #0

## 2020-07-10 DIAGNOSIS — E1165 Type 2 diabetes mellitus with hyperglycemia: Secondary | ICD-10-CM | POA: Diagnosis not present

## 2020-07-10 DIAGNOSIS — K5903 Drug induced constipation: Secondary | ICD-10-CM | POA: Diagnosis not present

## 2020-07-10 DIAGNOSIS — T402X5A Adverse effect of other opioids, initial encounter: Secondary | ICD-10-CM | POA: Diagnosis not present

## 2020-07-10 DIAGNOSIS — M545 Low back pain, unspecified: Secondary | ICD-10-CM | POA: Diagnosis not present

## 2020-07-10 DIAGNOSIS — Z79899 Other long term (current) drug therapy: Secondary | ICD-10-CM | POA: Diagnosis not present

## 2020-07-29 MED FILL — GENVOYA TABLET: 150-150-200 | 30 days supply | Qty: 30 | Fill #1

## 2020-07-31 ENCOUNTER — Telehealth (INDEPENDENT_AMBULATORY_CARE_PROVIDER_SITE_OTHER): Payer: Medicaid Other | Admitting: Infectious Diseases

## 2020-07-31 DIAGNOSIS — Z79899 Other long term (current) drug therapy: Secondary | ICD-10-CM

## 2020-07-31 DIAGNOSIS — M545 Low back pain, unspecified: Secondary | ICD-10-CM | POA: Diagnosis not present

## 2020-07-31 DIAGNOSIS — R109 Unspecified abdominal pain: Secondary | ICD-10-CM

## 2020-07-31 DIAGNOSIS — G8929 Other chronic pain: Secondary | ICD-10-CM | POA: Diagnosis not present

## 2020-07-31 DIAGNOSIS — E1165 Type 2 diabetes mellitus with hyperglycemia: Secondary | ICD-10-CM

## 2020-07-31 DIAGNOSIS — R10A0 Flank pain, unspecified side: Secondary | ICD-10-CM

## 2020-07-31 DIAGNOSIS — E6609 Other obesity due to excess calories: Secondary | ICD-10-CM

## 2020-07-31 DIAGNOSIS — Z6837 Body mass index (BMI) 37.0-37.9, adult: Secondary | ICD-10-CM

## 2020-07-31 DIAGNOSIS — Z794 Long term (current) use of insulin: Secondary | ICD-10-CM

## 2020-07-31 DIAGNOSIS — N870 Mild cervical dysplasia: Secondary | ICD-10-CM

## 2020-07-31 DIAGNOSIS — Z113 Encounter for screening for infections with a predominantly sexual mode of transmission: Secondary | ICD-10-CM

## 2020-07-31 DIAGNOSIS — B20 Human immunodeficiency virus [HIV] disease: Secondary | ICD-10-CM

## 2020-07-31 DIAGNOSIS — K642 Third degree hemorrhoids: Secondary | ICD-10-CM

## 2020-07-31 DIAGNOSIS — IMO0002 Reserved for concepts with insufficient information to code with codable children: Secondary | ICD-10-CM

## 2020-07-31 DIAGNOSIS — E66812 Obesity, class 2: Secondary | ICD-10-CM

## 2020-07-31 NOTE — Assessment & Plan Note (Signed)
Continues to follow with PCP.  Renal eval and renal u/s]

## 2020-07-31 NOTE — Assessment & Plan Note (Signed)
She appears to be doing well Will see her in person in 6 months.  COVID vax are up to date.  Continue genvoya.

## 2020-07-31 NOTE — Progress Notes (Signed)
   Subjective:    Patient ID: Brenda [name], female  DOB: December 30, 1970, 50 y.o.        MRN: [id number]   HPI 50yo F with hx of HIV+(06-1996), DM2 (on insulin).She has ongoing (GIand Heme)eval for anemia. She had colon 2-19 (hemerrhoids). Wants new surgery referral.  She has been on genvoya (?2003). Prev truvadaVevelyn [name].  She had renal u/s on 10-19-18 for abd pain. This showed developing medical-renal disease.  Has hx of CIN1 as well.   (phone visit) Today feels like her kidneys are hurting.  Also wants to see chiropracter on [address] [place].  Needs labs, she promisses to do.  Has been having headache. Is on BP rx, "everytime I go to the doctor, it's good". On cholesterol medicaition as well.  Has gotten COVID vaccine.  FSG have been <124. Is going to go back to the gym.   HIV 1 RNA Quant (copies/mL)  Date Value  04/26/2019 25 (H)  08/07/2018 26 (H)  06/21/2017 <20 DETECTED (A)   CD4 T Cell Abs (/uL)  Date Value  04/26/2019 992  08/07/2018 470  01/02/2018 500     Health Maintenance  Topic Date Due  . FOOT EXAM  Never done  . OPHTHALMOLOGY EXAM  Never done  . COVID-19 Vaccine (1) Never done  . TETANUS/TDAP  Never done  . PAP SMEAR-Modifier  01/27/2015  . HEMOGLOBIN A1C  10/24/2019  . INFLUENZA VACCINE  01/06/2020  . MAMMOGRAM  03/22/2020  . COLONOSCOPY (Pts 45-18yrs Insurance coverage will need to be confirmed)  07/26/2020  . PNEUMOCOCCAL POLYSACCHARIDE VACCINE AGE 28-64 HIGH RISK  Completed  . Hepatitis C Screening  Completed  . HIV Screening  Completed      Review of Systems  Constitutional: Negative for chills, fever and weight loss.  Eyes: Negative for blurred vision (aware she needs vision exam. ).  Respiratory: Negative for cough and shortness of breath.   Gastrointestinal: Negative for constipation and diarrhea.  Genitourinary: Negative for dysuria.   Please see HPI. All other systems reviewed and negative.     Objective:  Physical  Exam 1. Due to the national emergency this service was provided using telemedicine.  phone.  2. Consent from the patient for the telehealth visit and that you identified patient  3. Your locations, Pt and Provider- home, [facility].   4. Chief complaint for visit- HIV follow  5. Document anyone else on the call- none  6. If the visit was a phone call, that you include the time you spent on the call. 10 minutes         Assessment & Plan:

## 2020-07-31 NOTE — Assessment & Plan Note (Signed)
Will have her eval by renal with her hx of DM. Check renal u/s.

## 2020-07-31 NOTE — Assessment & Plan Note (Signed)
Encouraged to watch diet, exercise.

## 2020-07-31 NOTE — Assessment & Plan Note (Signed)
She gets PAP yearly

## 2020-07-31 NOTE — Assessment & Plan Note (Signed)
Will send for colon, exam.

## 2020-08-08 DIAGNOSIS — E1165 Type 2 diabetes mellitus with hyperglycemia: Secondary | ICD-10-CM | POA: Diagnosis not present

## 2020-08-08 DIAGNOSIS — I10 Essential (primary) hypertension: Secondary | ICD-10-CM | POA: Diagnosis not present

## 2020-08-08 DIAGNOSIS — M545 Low back pain, unspecified: Secondary | ICD-10-CM | POA: Diagnosis not present

## 2020-08-08 DIAGNOSIS — Z79899 Other long term (current) drug therapy: Secondary | ICD-10-CM | POA: Diagnosis not present

## 2020-08-08 DIAGNOSIS — E7800 Pure hypercholesterolemia, unspecified: Secondary | ICD-10-CM | POA: Diagnosis not present

## 2020-08-11 ENCOUNTER — Other Ambulatory Visit: Payer: Medicaid Other

## 2020-08-14 ENCOUNTER — Other Ambulatory Visit: Payer: Medicaid Other

## 2020-08-21 DIAGNOSIS — E1129 Type 2 diabetes mellitus with other diabetic kidney complication: Secondary | ICD-10-CM | POA: Diagnosis not present

## 2020-08-21 DIAGNOSIS — G8929 Other chronic pain: Secondary | ICD-10-CM | POA: Diagnosis not present

## 2020-08-21 DIAGNOSIS — E669 Obesity, unspecified: Secondary | ICD-10-CM | POA: Diagnosis not present

## 2020-08-21 DIAGNOSIS — N182 Chronic kidney disease, stage 2 (mild): Secondary | ICD-10-CM | POA: Diagnosis not present

## 2020-08-21 DIAGNOSIS — M5459 Other low back pain: Secondary | ICD-10-CM | POA: Diagnosis not present

## 2020-08-21 DIAGNOSIS — B20 Human immunodeficiency virus [HIV] disease: Secondary | ICD-10-CM | POA: Diagnosis not present

## 2020-08-27 ENCOUNTER — Other Ambulatory Visit: Payer: Self-pay | Admitting: Infectious Diseases

## 2020-08-27 DIAGNOSIS — B20 Human immunodeficiency virus [HIV] disease: Secondary | ICD-10-CM

## 2020-08-28 ENCOUNTER — Other Ambulatory Visit: Payer: Self-pay | Admitting: Infectious Diseases

## 2020-08-28 MED FILL — GENVOYA TABLET: 150-150-200 | 30 days supply | Qty: 30 | Fill #0

## 2020-09-02 DIAGNOSIS — G43909 Migraine, unspecified, not intractable, without status migrainosus: Secondary | ICD-10-CM | POA: Diagnosis not present

## 2020-09-02 DIAGNOSIS — I10 Essential (primary) hypertension: Secondary | ICD-10-CM | POA: Diagnosis not present

## 2020-09-02 DIAGNOSIS — E039 Hypothyroidism, unspecified: Secondary | ICD-10-CM | POA: Diagnosis not present

## 2020-09-02 DIAGNOSIS — E7800 Pure hypercholesterolemia, unspecified: Secondary | ICD-10-CM | POA: Diagnosis not present

## 2020-09-02 DIAGNOSIS — E1165 Type 2 diabetes mellitus with hyperglycemia: Secondary | ICD-10-CM | POA: Diagnosis not present

## 2020-09-02 DIAGNOSIS — Z1159 Encounter for screening for other viral diseases: Secondary | ICD-10-CM | POA: Diagnosis not present

## 2020-09-04 ENCOUNTER — Other Ambulatory Visit (HOSPITAL_COMMUNITY): Payer: Self-pay

## 2020-09-08 DIAGNOSIS — Z79899 Other long term (current) drug therapy: Secondary | ICD-10-CM | POA: Diagnosis not present

## 2020-09-08 DIAGNOSIS — I10 Essential (primary) hypertension: Secondary | ICD-10-CM | POA: Diagnosis not present

## 2020-09-08 DIAGNOSIS — M545 Low back pain, unspecified: Secondary | ICD-10-CM | POA: Diagnosis not present

## 2020-09-08 DIAGNOSIS — E1165 Type 2 diabetes mellitus with hyperglycemia: Secondary | ICD-10-CM | POA: Diagnosis not present

## 2020-09-08 DIAGNOSIS — F411 Generalized anxiety disorder: Secondary | ICD-10-CM | POA: Diagnosis not present

## 2020-09-22 ENCOUNTER — Other Ambulatory Visit (HOSPITAL_COMMUNITY): Payer: Self-pay

## 2020-09-29 ENCOUNTER — Other Ambulatory Visit (HOSPITAL_COMMUNITY): Payer: Self-pay

## 2020-10-08 DIAGNOSIS — E1165 Type 2 diabetes mellitus with hyperglycemia: Secondary | ICD-10-CM | POA: Diagnosis not present

## 2020-10-08 DIAGNOSIS — I10 Essential (primary) hypertension: Secondary | ICD-10-CM | POA: Diagnosis not present

## 2020-10-08 DIAGNOSIS — F411 Generalized anxiety disorder: Secondary | ICD-10-CM | POA: Diagnosis not present

## 2020-10-08 DIAGNOSIS — E7800 Pure hypercholesterolemia, unspecified: Secondary | ICD-10-CM | POA: Diagnosis not present

## 2020-10-08 DIAGNOSIS — Z79899 Other long term (current) drug therapy: Secondary | ICD-10-CM | POA: Diagnosis not present

## 2020-10-08 DIAGNOSIS — M545 Low back pain, unspecified: Secondary | ICD-10-CM | POA: Diagnosis not present

## 2020-10-09 DIAGNOSIS — F411 Generalized anxiety disorder: Secondary | ICD-10-CM | POA: Diagnosis not present

## 2020-10-09 DIAGNOSIS — G4700 Insomnia, unspecified: Secondary | ICD-10-CM | POA: Diagnosis not present

## 2020-10-09 DIAGNOSIS — F331 Major depressive disorder, recurrent, moderate: Secondary | ICD-10-CM | POA: Diagnosis not present

## 2020-11-05 DIAGNOSIS — F411 Generalized anxiety disorder: Secondary | ICD-10-CM | POA: Diagnosis not present

## 2020-11-05 DIAGNOSIS — I10 Essential (primary) hypertension: Secondary | ICD-10-CM | POA: Diagnosis not present

## 2020-11-05 DIAGNOSIS — E7800 Pure hypercholesterolemia, unspecified: Secondary | ICD-10-CM | POA: Diagnosis not present

## 2020-11-05 DIAGNOSIS — R110 Nausea: Secondary | ICD-10-CM | POA: Diagnosis not present

## 2020-11-05 DIAGNOSIS — Z79899 Other long term (current) drug therapy: Secondary | ICD-10-CM | POA: Diagnosis not present

## 2020-11-05 DIAGNOSIS — M545 Low back pain, unspecified: Secondary | ICD-10-CM | POA: Diagnosis not present

## 2020-11-06 ENCOUNTER — Encounter: Payer: Self-pay | Admitting: Internal Medicine

## 2020-11-24 DIAGNOSIS — E7800 Pure hypercholesterolemia, unspecified: Secondary | ICD-10-CM | POA: Diagnosis not present

## 2020-11-24 DIAGNOSIS — E1165 Type 2 diabetes mellitus with hyperglycemia: Secondary | ICD-10-CM | POA: Diagnosis not present

## 2020-11-24 DIAGNOSIS — G43909 Migraine, unspecified, not intractable, without status migrainosus: Secondary | ICD-10-CM | POA: Diagnosis not present

## 2020-11-24 DIAGNOSIS — I10 Essential (primary) hypertension: Secondary | ICD-10-CM | POA: Diagnosis not present

## 2020-11-24 DIAGNOSIS — E039 Hypothyroidism, unspecified: Secondary | ICD-10-CM | POA: Diagnosis not present

## 2020-11-27 ENCOUNTER — Encounter: Payer: Self-pay | Admitting: Neurology

## 2020-12-05 DIAGNOSIS — M545 Low back pain, unspecified: Secondary | ICD-10-CM | POA: Diagnosis not present

## 2020-12-05 DIAGNOSIS — Z79899 Other long term (current) drug therapy: Secondary | ICD-10-CM | POA: Diagnosis not present

## 2020-12-05 DIAGNOSIS — I10 Essential (primary) hypertension: Secondary | ICD-10-CM | POA: Diagnosis not present

## 2020-12-05 DIAGNOSIS — F411 Generalized anxiety disorder: Secondary | ICD-10-CM | POA: Diagnosis not present

## 2020-12-05 DIAGNOSIS — R110 Nausea: Secondary | ICD-10-CM | POA: Diagnosis not present

## 2020-12-05 DIAGNOSIS — E7800 Pure hypercholesterolemia, unspecified: Secondary | ICD-10-CM | POA: Diagnosis not present

## 2020-12-09 ENCOUNTER — Other Ambulatory Visit: Payer: Self-pay

## 2020-12-09 ENCOUNTER — Other Ambulatory Visit: Payer: Medicaid Other

## 2020-12-09 DIAGNOSIS — IMO0002 Reserved for concepts with insufficient information to code with codable children: Secondary | ICD-10-CM

## 2020-12-09 DIAGNOSIS — Z79899 Other long term (current) drug therapy: Secondary | ICD-10-CM | POA: Diagnosis not present

## 2020-12-09 DIAGNOSIS — Z794 Long term (current) use of insulin: Secondary | ICD-10-CM | POA: Diagnosis not present

## 2020-12-09 DIAGNOSIS — Z113 Encounter for screening for infections with a predominantly sexual mode of transmission: Secondary | ICD-10-CM | POA: Diagnosis not present

## 2020-12-09 DIAGNOSIS — E1165 Type 2 diabetes mellitus with hyperglycemia: Secondary | ICD-10-CM | POA: Diagnosis not present

## 2020-12-09 DIAGNOSIS — B20 Human immunodeficiency virus [HIV] disease: Secondary | ICD-10-CM | POA: Diagnosis not present

## 2020-12-10 LAB — T-HELPER CELL (CD4) - (RCID CLINIC ONLY)
CD4 % Helper T Cell: 34 % (ref 33–65)
CD4 T Cell Abs: 681 /uL (ref 400–1790)

## 2020-12-11 LAB — LIPID PANEL
Cholesterol: 187 mg/dL (ref <200)
HDL: 43 mg/dL — ABNORMAL LOW (ref >=50)
LDL Cholesterol (Calc): 119 mg/dL (calc) — ABNORMAL HIGH
Non-HDL Cholesterol (Calc): 144 mg/dL (calc) — ABNORMAL HIGH (ref <130)
Total CHOL/HDL Ratio: 4.3 (calc) (ref <5.0)
Triglycerides: 132 mg/dL (ref <150)

## 2020-12-11 LAB — COMPREHENSIVE METABOLIC PANEL
AG Ratio: 1.4 (calc) (ref 1.0–2.5)
ALT: 9 U/L (ref 6–29)
AST: 12 U/L (ref 10–35)
Albumin: 4.2 g/dL (ref 3.6–5.1)
Alkaline phosphatase (APISO): 103 U/L (ref 37–153)
BUN/Creatinine Ratio: 11 (calc) (ref 6–22)
BUN: 12 mg/dL (ref 7–25)
CO2: 26 mmol/L (ref 20–32)
Calcium: 9.4 mg/dL (ref 8.6–10.4)
Chloride: 103 mmol/L (ref 98–110)
Creat: 1.12 mg/dL — ABNORMAL HIGH (ref 0.50–1.05)
Globulin: 3.1 g/dL (calc) (ref 1.9–3.7)
Glucose, Bld: 258 mg/dL — ABNORMAL HIGH (ref 65–99)
Potassium: 3.8 mmol/L (ref 3.5–5.3)
Sodium: 139 mmol/L (ref 135–146)
Total Bilirubin: 0.3 mg/dL (ref 0.2–1.2)
Total Protein: 7.3 g/dL (ref 6.1–8.1)

## 2020-12-11 LAB — CBC
HCT: 36.2 % (ref 35.0–45.0)
Hemoglobin: 11.4 g/dL — ABNORMAL LOW (ref 11.7–15.5)
MCH: 24.9 pg — ABNORMAL LOW (ref 27.0–33.0)
MCHC: 31.5 g/dL — ABNORMAL LOW (ref 32.0–36.0)
MCV: 79 fL — ABNORMAL LOW (ref 80.0–100.0)
MPV: 10.4 fL (ref 7.5–12.5)
Platelets: 339 10*3/uL (ref 140–400)
RBC: 4.58 10*6/uL (ref 3.80–5.10)
RDW: 14.1 % (ref 11.0–15.0)
WBC: 5.2 10*3/uL (ref 3.8–10.8)

## 2020-12-11 LAB — HEMOGLOBIN A1C
Hgb A1c MFr Bld: 8.5 % of total Hgb — ABNORMAL HIGH (ref <5.7)
Mean Plasma Glucose: 197 mg/dL
eAG (mmol/L): 10.9 mmol/L

## 2020-12-11 LAB — RPR: RPR Ser Ql: NONREACTIVE

## 2020-12-11 LAB — HIV-1 RNA QUANT-NO REFLEX-BLD
HIV 1 RNA Quant: NOT DETECTED Copies/mL
HIV-1 RNA Quant, Log: NOT DETECTED Log cps/mL

## 2020-12-24 NOTE — Progress Notes (Signed)
Patient ID: Brenda [name], female   DOB: 1970/07/31, 50 y.o.   MRN: [id number] Referral in for US Renal patient was scheduled for 08-11-20 and 08-14-20 and canceled both times I have called her 5 times to reschedule and have not been able to get her to call back  Closing referral

## 2021-01-07 DIAGNOSIS — R110 Nausea: Secondary | ICD-10-CM | POA: Diagnosis not present

## 2021-01-07 DIAGNOSIS — E7800 Pure hypercholesterolemia, unspecified: Secondary | ICD-10-CM | POA: Diagnosis not present

## 2021-01-07 DIAGNOSIS — Z79899 Other long term (current) drug therapy: Secondary | ICD-10-CM | POA: Diagnosis not present

## 2021-01-07 DIAGNOSIS — I10 Essential (primary) hypertension: Secondary | ICD-10-CM | POA: Diagnosis not present

## 2021-01-07 DIAGNOSIS — F411 Generalized anxiety disorder: Secondary | ICD-10-CM | POA: Diagnosis not present

## 2021-01-07 DIAGNOSIS — M545 Low back pain, unspecified: Secondary | ICD-10-CM | POA: Diagnosis not present

## 2021-01-09 ENCOUNTER — Encounter: Payer: Medicaid Other | Admitting: Infectious Diseases

## 2021-01-09 DIAGNOSIS — Z79899 Other long term (current) drug therapy: Secondary | ICD-10-CM | POA: Diagnosis not present

## 2021-01-15 ENCOUNTER — Ambulatory Visit (INDEPENDENT_AMBULATORY_CARE_PROVIDER_SITE_OTHER): Payer: Medicaid Other | Admitting: Infectious Diseases

## 2021-01-15 ENCOUNTER — Other Ambulatory Visit: Payer: Self-pay

## 2021-01-15 ENCOUNTER — Encounter: Payer: Self-pay | Admitting: Infectious Diseases

## 2021-01-15 VITALS — BP 134/93 | HR 85 | Temp 97.8°F | Wt 221.0 lb

## 2021-01-15 DIAGNOSIS — Z79899 Other long term (current) drug therapy: Secondary | ICD-10-CM | POA: Diagnosis not present

## 2021-01-15 DIAGNOSIS — B20 Human immunodeficiency virus [HIV] disease: Secondary | ICD-10-CM

## 2021-01-15 DIAGNOSIS — E1165 Type 2 diabetes mellitus with hyperglycemia: Secondary | ICD-10-CM

## 2021-01-15 DIAGNOSIS — K642 Third degree hemorrhoids: Secondary | ICD-10-CM

## 2021-01-15 DIAGNOSIS — Z113 Encounter for screening for infections with a predominantly sexual mode of transmission: Secondary | ICD-10-CM | POA: Diagnosis not present

## 2021-01-15 DIAGNOSIS — Z794 Long term (current) use of insulin: Secondary | ICD-10-CM

## 2021-01-15 DIAGNOSIS — Z0000 Encounter for general adult medical examination without abnormal findings: Secondary | ICD-10-CM | POA: Insufficient documentation

## 2021-01-15 DIAGNOSIS — IMO0002 Reserved for concepts with insufficient information to code with codable children: Secondary | ICD-10-CM

## 2021-01-15 DIAGNOSIS — N870 Mild cervical dysplasia: Secondary | ICD-10-CM

## 2021-01-15 NOTE — Assessment & Plan Note (Signed)
She has f/u with [facility].

## 2021-01-15 NOTE — Assessment & Plan Note (Signed)
Will send refferal to surgery.

## 2021-01-15 NOTE — Assessment & Plan Note (Addendum)
Appreciate her PCP f/u States he glc have been good.  Has cut out sweets, drinking more water.  Her A1C 8.5% at this blood draw. Appreciate PCP f/u.

## 2021-01-15 NOTE — Assessment & Plan Note (Addendum)
Offered/refused condoms.  She is doing well Continue her current rx rtc in 9 months with labs prior Shingles vax.  Will refer for colon Flu shot when available.

## 2021-01-15 NOTE — Assessment & Plan Note (Signed)
Check colon.

## 2021-01-15 NOTE — Progress Notes (Signed)
Subjective:    Patient ID: Brenda [name], female  DOB: 1970-09-11, 50 y.o.        MRN: [id number]   HPI 50 yo F with hx of HIV+ (06-1996), DM2 (on insulin, dx year 2000, when she had her daughter). She has ongoing (GI and Heme) eval for anemia. She had colon 2-19 (hemerrhoids). Wants new surgery referral.  She has been on genvoya (?2003). Prev truvadaVevelyn [name].  She had renal u/s on 10-19-18 for abd pain. This showed developing medical-renal disease.  Has hx of CIN1 as well.   She was sent to Nephro at her last visit. She says she went and everything was good.  She has not been seen by surgery for her hemorrhoids. Have not been bothering her lately.  She has appt with neuro- having persistent headaches.  Mother is stressing her out a lot. They go to counseling together, she is very negative. She is much closer to her father.  Her husband is positive, on ART. She got married 01-11-21.     HIV 1 RNA Quant  Date Value  12/09/2020 Not Detected Copies/mL  04/26/2019 25 copies/mL (H)  08/07/2018 26 copies/mL (H)   CD4 T Cell Abs (/uL)  Date Value  12/09/2020 681  04/26/2019 992  08/07/2018 470     Health Maintenance  Topic Date Due  . COVID-19 Vaccine (1) Never done  . FOOT EXAM  Never done  . OPHTHALMOLOGY EXAM  Never done  . TETANUS/TDAP  Never done  . Zoster Vaccines- Shingrix (1 of 2) Never done  . PAP SMEAR-Modifier  01/27/2015  . Pneumococcal Vaccine 64-68 Years old (3 - PCV) 09/27/2019  . MAMMOGRAM  03/22/2020  . COLONOSCOPY (Pts 45-68yr Insurance coverage will need to be confirmed)  07/26/2020  . INFLUENZA VACCINE  01/05/2021  . HEMOGLOBIN A1C  06/11/2021  . PNEUMOCOCCAL POLYSACCHARIDE VACCINE AGE 76-64 HIGH RISK  Completed  . Hepatitis C Screening  Completed  . HIV Screening  Completed  . HPV VACCINES  Aged Out      Review of Systems  Constitutional:  Negative for chills, fever and weight loss.  Respiratory:  Negative for cough and shortness of breath.    Gastrointestinal:  Negative for constipation and diarrhea.  Genitourinary:  Negative for dysuria.   Please see HPI. All other systems reviewed and negative.     Objective:  Physical Exam Vitals reviewed.  HENT:     Mouth/Throat:     Mouth: Mucous membranes are moist.     Pharynx: No oropharyngeal exudate.  Eyes:     Extraocular Movements: Extraocular movements intact.     Pupils: Pupils are equal, round, and reactive to light.  Cardiovascular:     Rate and Rhythm: Normal rate and regular rhythm.  Pulmonary:     Effort: Pulmonary effort is normal.     Breath sounds: Normal breath sounds.  Abdominal:     General: Bowel sounds are normal. There is no distension.     Palpations: Abdomen is soft.     Tenderness: There is no abdominal tenderness.  Musculoskeletal:        General: Normal range of motion.     Cervical back: Normal range of motion and neck supple.     Right lower leg: No edema.     Left lower leg: No edema.  Skin:    Comments: Multiple dark lesions on her body. She has superficial wounds on top of some of thes.   Neurological:  General: No focal deficit present.     Mental Status: She is alert.          Assessment & Plan:

## 2021-02-06 DIAGNOSIS — M545 Low back pain, unspecified: Secondary | ICD-10-CM | POA: Diagnosis not present

## 2021-02-06 DIAGNOSIS — I10 Essential (primary) hypertension: Secondary | ICD-10-CM | POA: Diagnosis not present

## 2021-02-06 DIAGNOSIS — F411 Generalized anxiety disorder: Secondary | ICD-10-CM | POA: Diagnosis not present

## 2021-02-06 DIAGNOSIS — E7800 Pure hypercholesterolemia, unspecified: Secondary | ICD-10-CM | POA: Diagnosis not present

## 2021-02-06 DIAGNOSIS — Z79899 Other long term (current) drug therapy: Secondary | ICD-10-CM | POA: Diagnosis not present

## 2021-02-10 DIAGNOSIS — Z79899 Other long term (current) drug therapy: Secondary | ICD-10-CM | POA: Diagnosis not present

## 2021-02-11 ENCOUNTER — Encounter: Payer: Self-pay | Admitting: Internal Medicine

## 2021-02-11 DIAGNOSIS — G4700 Insomnia, unspecified: Secondary | ICD-10-CM | POA: Diagnosis not present

## 2021-02-11 DIAGNOSIS — F331 Major depressive disorder, recurrent, moderate: Secondary | ICD-10-CM | POA: Diagnosis not present

## 2021-02-11 DIAGNOSIS — F411 Generalized anxiety disorder: Secondary | ICD-10-CM | POA: Diagnosis not present

## 2021-02-13 NOTE — Progress Notes (Signed)
NEUROLOGY CONSULTATION NOTE  Brenda [name] MRN: [id number] DOB: 15-Nov-1970  Referring provider: Amedeo Kinsman, MD Primary care provider: Amedeo Kinsman, MD  Reason for consult:  headache  Assessment/Plan:   Migraine with aura, without status migrainosus, not intractable  Migraine prevention:  Defer as migraines are not frequent Migraine rescue:  Maxalt MLT '10mg'$  with Zofran ODT '4mg'$ .  If ineffective, would try Nurtec or Ubrelvy Limit use of pain relievers to no more than 2 days out of week to prevent risk of rebound or medication-overuse headache. Keep headache diary Follow up 6 months.    Subjective:  Brenda [name] is a 50 year old left-handed female with CKD, DM II, HIV, HTN, depression/anxiety and history of cryptococcus meningitis who presents for headaches.  History supplemented by IM note.  Onset:  childhood.  Resolved in 2017 but returned in 2022. Location:  left frontal region Quality:  aching, throbbing Intensity:  10/10.   Aura:  left facial numbness and tingling.  [name] stars in vision of left eye Prodrome:  absent Associated symptoms:  Nausea, vomiting, photophobia, phonophobia.  She denies associated unilateral weakness. Duration:  all day Frequency:  3 times a month Frequency of abortive medication: takes Percocet daily for chronic pain Triggers:  stress, neck pain Relieving factors:  resting in dark with heating pad Activity:  movement worse.  Stays in dark  Prior Imaging (personally reviewed) 05/19/2018 CT HEAD:  No acute finding.  Normal intracranial imaging. 08/15/2015 MRI BRAIN WO:  No acute intracranial finding. Low level T2 signal of the white matter which can be seen in HIV infection.  Rescue protocol:  Tylenol Cold and Sinus, Benadryl, ibuprofen Current NSAIDS/analgesics:  Tylenol, ibuprofen, Percocet (chronic pain) Current triptans:  none Current ergotamine:  none Current anti-emetic:  Zofran '4mg'$  Current muscle relaxants:   baclofen '10mg'$  TID PRN Current Antihypertensive medications:  Losartan Current Antidepressant medications:  trazodone '100mg'$  QHS Current Anticonvulsant medications:   Current anti-CGRP:  none Current Vitamins/Herbal/Supplements:  D3 Current Antihistamines/Decongestants:  Benadryl, Advil Cold and Sinus Other therapy:  none Hormone/birth control:  none   Past NSAIDS/analgesics:  Demerol shot Past abortive triptans:  sumatriptan [place] Past abortive ergotamine:  none Past muscle relaxants:  baclofen Past anti-emetic:  none Past antihypertensive medications:  none Past antidepressant medications:  sertraline '100mg'$  Past anticonvulsant medications:  Trokendi XR '100mg'$  QD, Lyrica '100mg'$  TID Past anti-CGRP:  none Past vitamins/Herbal/Supplements:  none Past antihistamines/decongestants:  hydroxyzine Other past therapies:  none  Caffeine:  1 cup of coffee most days, sometimes 1 cup at bedtime.  Pepsi on weekend Diet:  Drinks water all day.  Does not skip meals. 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 Raphael Gibney 2011   HYSTEROSCOPY WITH D & C N/A 04/12/2018   Procedure: DILATATION AND CURETTAGE /HYSTEROSCOPY;  Surgeon: Ena Dawley, MD;  Location: [facility];  Service: Gynecology;  Laterality: N/A;   LAPAROSCOPIC LYSIS OF ADHESIONS  01/10/2014   Procedure: LAPAROSCOPIC LYSIS OF ADHESIONS;  Surgeon: Ena Dawley, MD;  Location: [facility];  Service: Gynecology;;   LAPAROSCOPY N/A 01/10/2014   Procedure: LAPAROSCOPY OPERATIVE;  Surgeon: Ena Dawley, MD;  Location: [facility];  Service: Gynecology;  Laterality: N/A;   TUBAL LIGATION     UMBILICAL HERNIA REPAIR  01/10/2014   Procedure: HERNIA REPAIR UMBILICAL ADULT;  Surgeon: Ena Dawley, MD;  Location: [facility];  Service: Gynecology;;   WISDOM TOOTH EXTRACTION      MEDICATIONS: Current Outpatient Medications on File Prior to Visit  Medication Sig Dispense Refill   ACCU-CHEK GUIDE test strip 3 (three) times daily. for testing     atorvastatin (LIPITOR) 20 MG tablet atorvastatin 20 mg tablet  TAKE 1 TABLET BY MOUTH ONCE DAILY     Buprenorphine HCl (BELBUCA) 75 MCG FILM [place] 75 mcg buccal film  USE ONE FILM BY MOUTH TWICE DAILY. USE JUST 1 AT BEDTIME FOR FIRST 4 DAYS THEN TWICE DAILY     diphenhydrAMINE (BENADRYL) 25 mg capsule Take 50 mg by mouth daily as needed for allergies.     elvitegravir-cobicistat-emtricitabine-tenofovir (GENVOYA) 150-150-200-10 MG TABS tablet TAKE 1 TABLET BY MOUTH DAILY WITH BREAKFAST. 30 tablet 5   empagliflozin (JARDIANCE) 10 MG TABS tablet Take 10 mg by mouth daily.     hydrocortisone (ANUSOL-HC) 2.5 % rectal cream Place 1 application rectally 2 (two) times daily. For hemorrhoids 30 g 1   Insulin Glargine 300 UNIT/ML SOPN Inject 30 Units into the skin every morning.      linaclotide (LINZESS) 290 MCG CAPS capsule Linzess 290 mcg capsule  TK 1 C PO D     losartan (COZAAR) 100 MG tablet TAKE 1 TABLET BY MOUTH ONCE DAILY THIS REPLACES LISINORIL TO PROTECT KIDNEY IN DIABETES ABOVE AND  BEYOND BLOOD PRESSURE CONTROL     metFORMIN (GLUCOPHAGE) 1000 MG tablet Take 1,000 mg by mouth daily.  0   metroNIDAZOLE (FLAGYL) 500 MG tablet metronidazole 500 mg tablet  Take 1 tablet twice a day by oral route for 7 days.     Multiple Vitamins-Minerals (MULTIVITAMIN PO) Take 1 tablet by mouth daily.     naproxen (NAPROSYN) 500 MG tablet Take 1 tablet (500 mg total) by mouth 2 (two) times daily. 30 tablet 0   NOVOLOG FLEXPEN 100 UNIT/ML FlexPen INJECT 40 UNITS SUBCUTANEOUSLY THREE TIMES DAILY     Omega-3 Fatty Acids (FISH OIL) 1000 MG CAPS Take 1,000 mg by mouth daily.     ondansetron (ZOFRAN) 4 MG tablet Take 4 mg by mouth 3 (three) times daily as needed.     oxyCODONE-acetaminophen (PERCOCET) 10-325 MG tablet Take 1 tablet by mouth 4 (four) times daily as needed for pain.     predniSONE (STERAPRED UNI-PAK 21 TAB) 10 MG (21) TBPK tablet Take 10 mg by mouth as directed.  prochlorperazine (COMPAZINE) 10 MG tablet Take 1 tablet (10 mg total) by mouth 2 (two) times daily as needed for nausea. 20 tablet 0   Semaglutide, 1 MG/DOSE, 2 MG/1.5ML SOPN Inject 1 mg into the skin every Monday.     sertraline (ZOLOFT) 100 MG tablet Take 100 mg by mouth at bedtime.     tiZANidine (ZANAFLEX) 4 MG tablet Take 1 tablet (4 mg total) by mouth every 6 (six) hours as needed for muscle spasms. 30 tablet 0   traZODone (DESYREL) 100 MG tablet Take 100 mg by mouth at bedtime.     valACYclovir (VALTREX) 500 MG tablet TK 2 TS PO BID FOR 3 DAYS THEN TK 1 T PO QD     VOLTAREN 1 % GEL Apply 2 g topically 4 (four) times daily as needed (for knee pain).      VOLTAREN 1 % GEL Apply  as directed to affected area three times a day     Current Facility-Administered Medications on File Prior to Visit  Medication Dose Route Frequency Provider Last Rate Last Admin   0.9 %  sodium chloride infusion  500 mL Intravenous Once Gatha Mayer, MD        ALLERGIES: Allergies  Allergen Reactions   Cortisone Itching and Other  (See Comments)    REACTION: "nerves"   Hydrocodone-Acetaminophen Itching and Other (See Comments)    REACTION: "nerves"   Morphine Itching and Other (See Comments)    REACTION: "nerves"    FAMILY HISTORY: Family History  Problem Relation Age of Onset   Diabetes Mother    Heart failure Mother    Heart failure Father    Diabetes Father    Heart failure Brother    Diabetes Brother    Colon cancer Other        neg hx.   Diabetes Maternal Grandmother    Alzheimer's disease Paternal Grandmother     Objective:  Blood pressure (!) 142/81, pulse 86, height '5\' 8"'$  (1.727 m), weight 226 lb (102.5 kg), SpO2 95 %. General: No acute distress.  Patient appears well-groomed.   Head:  Normocephalic/atraumatic Eyes:  fundi examined but not visualized Neck: supple, no paraspinal tenderness, full range of motion Back: No paraspinal tenderness Heart: regular rate and rhythm Lungs: Clear to auscultation bilaterally. Vascular: No carotid bruits. Neurological Exam: Mental status: alert and oriented to person, place, and time, recent and remote memory intact, fund of knowledge intact, attention and concentration intact, speech fluent and not dysarthric, language intact. Cranial nerves: CN I: not tested CN II: pupils equal, round and reactive to light, visual fields intact CN III, IV, VI:  full range of motion, no nystagmus, no ptosis CN V: facial sensation intact. CN VII: upper and lower face symmetric CN VIII: hearing intact CN IX, X: gag intact, uvula midline CN XI: sternocleidomastoid and trapezius muscles intact CN XII: tongue midline Bulk & Tone: normal, no fasciculations. Motor:  muscle strength 5/5 throughout Sensation:  Pinprick, temperature and vibratory sensation intact. Deep Tendon Reflexes:  2+ throughout,  toes downgoing.   Finger to nose testing:  Without dysmetria.   Heel to shin:  Without dysmetria.   Gait:  Normal station and stride.  Romberg negative.    Thank you for  allowing me to take part in the care of this patient.  Metta Clines, DO  CC: Kellie Shropshire, MD

## 2021-02-16 ENCOUNTER — Encounter: Payer: Self-pay | Admitting: Neurology

## 2021-02-16 ENCOUNTER — Other Ambulatory Visit: Payer: Self-pay

## 2021-02-16 ENCOUNTER — Ambulatory Visit: Payer: Medicaid Other | Admitting: Neurology

## 2021-02-16 VITALS — BP 142/81 | HR 86 | Ht 68.0 in | Wt 226.0 lb

## 2021-02-16 DIAGNOSIS — G43109 Migraine with aura, not intractable, without status migrainosus: Secondary | ICD-10-CM

## 2021-02-16 MED ORDER — RIZATRIPTAN BENZOATE 10 MG PO TBDP
10.0000 mg | ORAL_TABLET | ORAL | 5 refills | Status: DC | PRN
Start: 2021-02-16 — End: 2021-09-15

## 2021-02-16 MED ORDER — ONDANSETRON 4 MG PO TBDP
4.0000 mg | ORAL_TABLET | Freq: Three times a day (TID) | ORAL | 5 refills | Status: DC | PRN
Start: 2021-02-16 — End: 2022-08-25

## 2021-02-16 NOTE — Patient Instructions (Signed)
  Take rizatriptan dissolvable tablet at earliest onset of headache.  May repeat dose once in 2 hours if needed.  Maximum 2 tablets in 24 hours. Take ondansetron dissolvable tablet immediately as well for nausea. Limit use of pain relievers to no more than 2 days out of the week.  These medications include acetaminophen, NSAIDs (ibuprofen/Advil/Motrin, naproxen/Aleve, triptans (Imitrex/sumatriptan), Excedrin, and narcotics.  This will help reduce risk of rebound headaches. Be aware of common food triggers:  - Caffeine:  coffee, black tea, cola, Mt. Dew  - Chocolate  - Dairy:  aged cheeses (brie, blue, cheddar, gouda, [place], provolone, [place], Swiss, etc), chocolate milk, buttermilk, sour cream, limit eggs and yogurt  - Nuts, peanut butter  - Alcohol  - Cereals/grains:  FRESH breads (fresh bagels, sourdough, doughnuts), yeast productions  - Processed/canned/aged/cured meats (pre-packaged deli meats, hotdogs)  - MSG/glutamate:  soy sauce, flavor enhancer, pickled/preserved/marinated foods  - Sweeteners:  aspartame (Equal, Nutrasweet).  Sugar and Splenda are okay  - Vegetables:  legumes (lima beans, lentils, snow peas, fava beans, pinto peans, peas, garbanzo beans), sauerkraut, onions, olives, pickles  - Fruit:  avocados, bananas, citrus fruit (orange, lemon, grapefruit), mango  - Other:  Frozen meals, macaroni and cheese Routine exercise Stay adequately hydrated (aim for 64 oz water daily) Keep headache diary Maintain proper stress management Maintain proper sleep hygiene Do not skip meals Consider supplements:  magnesium citrate '400mg'$  daily, riboflavin '400mg'$  daily, coenzyme Q10 '100mg'$  three times daily.

## 2021-02-17 ENCOUNTER — Encounter: Payer: Self-pay | Admitting: Internal Medicine

## 2021-03-10 DIAGNOSIS — M545 Low back pain, unspecified: Secondary | ICD-10-CM | POA: Diagnosis not present

## 2021-03-10 DIAGNOSIS — E1165 Type 2 diabetes mellitus with hyperglycemia: Secondary | ICD-10-CM | POA: Diagnosis not present

## 2021-03-10 DIAGNOSIS — H538 Other visual disturbances: Secondary | ICD-10-CM | POA: Diagnosis not present

## 2021-03-10 DIAGNOSIS — R400 Somnolence: Secondary | ICD-10-CM | POA: Diagnosis not present

## 2021-03-11 DIAGNOSIS — M545 Low back pain, unspecified: Secondary | ICD-10-CM | POA: Diagnosis not present

## 2021-03-11 DIAGNOSIS — Z79899 Other long term (current) drug therapy: Secondary | ICD-10-CM | POA: Diagnosis not present

## 2021-03-20 ENCOUNTER — Other Ambulatory Visit (HOSPITAL_COMMUNITY): Payer: Self-pay

## 2021-03-20 MED FILL — Elvitegrav-Cobic-Emtricitab-Tenofov AF Tab 150-150-200-10 MG: ORAL | 30 days supply | Qty: 30 | Fill #0 | Status: AC

## 2021-04-09 DIAGNOSIS — Z79899 Other long term (current) drug therapy: Secondary | ICD-10-CM | POA: Diagnosis not present

## 2021-04-09 DIAGNOSIS — R059 Cough, unspecified: Secondary | ICD-10-CM | POA: Diagnosis not present

## 2021-04-09 DIAGNOSIS — J101 Influenza due to other identified influenza virus with other respiratory manifestations: Secondary | ICD-10-CM | POA: Diagnosis not present

## 2021-04-09 DIAGNOSIS — M545 Low back pain, unspecified: Secondary | ICD-10-CM | POA: Diagnosis not present

## 2021-04-09 DIAGNOSIS — Z9189 Other specified personal risk factors, not elsewhere classified: Secondary | ICD-10-CM | POA: Diagnosis not present

## 2021-04-16 DIAGNOSIS — Z79899 Other long term (current) drug therapy: Secondary | ICD-10-CM | POA: Diagnosis not present

## 2021-04-16 DIAGNOSIS — M545 Low back pain, unspecified: Secondary | ICD-10-CM | POA: Diagnosis not present

## 2021-04-16 DIAGNOSIS — B3731 Acute candidiasis of vulva and vagina: Secondary | ICD-10-CM | POA: Diagnosis not present

## 2021-04-17 ENCOUNTER — Other Ambulatory Visit (HOSPITAL_COMMUNITY): Payer: Self-pay

## 2021-04-17 MED FILL — Elvitegrav-Cobic-Emtricitab-Tenofov AF Tab 150-150-200-10 MG: ORAL | 30 days supply | Qty: 30 | Fill #1 | Status: AC

## 2021-04-20 ENCOUNTER — Other Ambulatory Visit (HOSPITAL_COMMUNITY): Payer: Self-pay

## 2021-04-20 DIAGNOSIS — Z79899 Other long term (current) drug therapy: Secondary | ICD-10-CM | POA: Diagnosis not present

## 2021-05-08 DIAGNOSIS — E1165 Type 2 diabetes mellitus with hyperglycemia: Secondary | ICD-10-CM | POA: Diagnosis not present

## 2021-05-08 DIAGNOSIS — M545 Low back pain, unspecified: Secondary | ICD-10-CM | POA: Diagnosis not present

## 2021-05-08 DIAGNOSIS — Z79899 Other long term (current) drug therapy: Secondary | ICD-10-CM | POA: Diagnosis not present

## 2021-05-08 DIAGNOSIS — R030 Elevated blood-pressure reading, without diagnosis of hypertension: Secondary | ICD-10-CM | POA: Diagnosis not present

## 2021-05-08 DIAGNOSIS — B3731 Acute candidiasis of vulva and vagina: Secondary | ICD-10-CM | POA: Diagnosis not present

## 2021-05-12 DIAGNOSIS — Z79899 Other long term (current) drug therapy: Secondary | ICD-10-CM | POA: Diagnosis not present

## 2021-05-18 ENCOUNTER — Other Ambulatory Visit (HOSPITAL_COMMUNITY): Payer: Self-pay

## 2021-05-18 MED FILL — Elvitegrav-Cobic-Emtricitab-Tenofov AF Tab 150-150-200-10 MG: ORAL | 30 days supply | Qty: 30 | Fill #2 | Status: CN

## 2021-05-19 ENCOUNTER — Other Ambulatory Visit (HOSPITAL_COMMUNITY): Payer: Self-pay

## 2021-05-19 ENCOUNTER — Encounter: Payer: Self-pay | Admitting: Hematology and Oncology

## 2021-05-20 DIAGNOSIS — M542 Cervicalgia: Secondary | ICD-10-CM | POA: Diagnosis not present

## 2021-05-20 DIAGNOSIS — M25512 Pain in left shoulder: Secondary | ICD-10-CM | POA: Diagnosis not present

## 2021-05-23 DIAGNOSIS — E1165 Type 2 diabetes mellitus with hyperglycemia: Secondary | ICD-10-CM | POA: Diagnosis not present

## 2021-05-23 DIAGNOSIS — M5030 Other cervical disc degeneration, unspecified cervical region: Secondary | ICD-10-CM | POA: Diagnosis not present

## 2021-05-23 DIAGNOSIS — R110 Nausea: Secondary | ICD-10-CM | POA: Diagnosis not present

## 2021-05-23 DIAGNOSIS — G43909 Migraine, unspecified, not intractable, without status migrainosus: Secondary | ICD-10-CM | POA: Diagnosis not present

## 2021-05-23 DIAGNOSIS — E7800 Pure hypercholesterolemia, unspecified: Secondary | ICD-10-CM | POA: Diagnosis not present

## 2021-05-23 DIAGNOSIS — I10 Essential (primary) hypertension: Secondary | ICD-10-CM | POA: Diagnosis not present

## 2021-05-23 DIAGNOSIS — Z0000 Encounter for general adult medical examination without abnormal findings: Secondary | ICD-10-CM | POA: Diagnosis not present

## 2021-05-27 ENCOUNTER — Encounter (HOSPITAL_COMMUNITY): Payer: Self-pay | Admitting: Emergency Medicine

## 2021-05-27 ENCOUNTER — Other Ambulatory Visit: Payer: Self-pay

## 2021-05-27 ENCOUNTER — Emergency Department (HOSPITAL_COMMUNITY)
Admission: EM | Admit: 2021-05-27 | Discharge: 2021-05-27 | Disposition: A | Discharge status: Home / Self Care | Payer: Medicaid Other | Attending: Emergency Medicine | Admitting: Emergency Medicine

## 2021-05-27 ENCOUNTER — Emergency Department (HOSPITAL_COMMUNITY): Payer: Medicaid Other

## 2021-05-27 DIAGNOSIS — I129 Hypertensive chronic kidney disease with stage 1 through stage 4 chronic kidney disease, or unspecified chronic kidney disease: Secondary | ICD-10-CM | POA: Diagnosis not present

## 2021-05-27 DIAGNOSIS — M25522 Pain in left elbow: Secondary | ICD-10-CM | POA: Diagnosis present

## 2021-05-27 DIAGNOSIS — Z794 Long term (current) use of insulin: Secondary | ICD-10-CM | POA: Diagnosis not present

## 2021-05-27 DIAGNOSIS — E119 Type 2 diabetes mellitus without complications: Secondary | ICD-10-CM | POA: Insufficient documentation

## 2021-05-27 DIAGNOSIS — Z21 Asymptomatic human immunodeficiency virus [HIV] infection status: Secondary | ICD-10-CM | POA: Insufficient documentation

## 2021-05-27 DIAGNOSIS — Z7984 Long term (current) use of oral hypoglycemic drugs: Secondary | ICD-10-CM | POA: Diagnosis not present

## 2021-05-27 DIAGNOSIS — G5612 Other lesions of median nerve, left upper limb: Secondary | ICD-10-CM | POA: Insufficient documentation

## 2021-05-27 DIAGNOSIS — Z79899 Other long term (current) drug therapy: Secondary | ICD-10-CM | POA: Diagnosis not present

## 2021-05-27 DIAGNOSIS — M7712 Lateral epicondylitis, left elbow: Secondary | ICD-10-CM | POA: Diagnosis not present

## 2021-05-27 DIAGNOSIS — G5610 Other lesions of median nerve, unspecified upper limb: Secondary | ICD-10-CM | POA: Diagnosis not present

## 2021-05-27 DIAGNOSIS — N189 Chronic kidney disease, unspecified: Secondary | ICD-10-CM | POA: Insufficient documentation

## 2021-05-27 DIAGNOSIS — M778 Other enthesopathies, not elsewhere classified: Secondary | ICD-10-CM

## 2021-05-27 DIAGNOSIS — G5600 Carpal tunnel syndrome, unspecified upper limb: Secondary | ICD-10-CM

## 2021-05-27 MED ORDER — OXYCODONE-ACETAMINOPHEN 5-325 MG PO TABS
1.0000 | ORAL_TABLET | Freq: Once | ORAL | Status: AC
  Administered 2021-05-27: 12:00:00 1 via ORAL
  Filled 2021-05-27: qty 1

## 2021-05-27 MED ORDER — NAPROXEN 250 MG PO TABS
500.0000 mg | ORAL_TABLET | Freq: Once | ORAL | Status: DC
  Filled 2021-05-27: qty 2

## 2021-05-27 NOTE — ED Notes (Signed)
Right elbow brace placed by ortho team

## 2021-05-27 NOTE — Progress Notes (Signed)
Orthopedic Tech Progress Note Patient Details:  Brenda [name] 07-25-1970 [id number]  Called in a STAT order to [facility] for a Brenda [name]   Patient ID: Brenda [name], female   DOB: [birth year], 50 y.o.   MRN: [id number]  Brenda [name] 05/27/2021, 10:52 AM

## 2021-05-27 NOTE — ED Notes (Signed)
Pt transported to Xray. 

## 2021-05-27 NOTE — ED Triage Notes (Addendum)
Pt reports left arm swelling and pain since last week. Most pain in elbow. Denies any trauma. No obvious swelling or deformity noted. Reports improvement with ice.  States she was seen at [facility] on Monday and had xrays that showed arthritis and started on prednisone.

## 2021-05-27 NOTE — ED Notes (Signed)
No answer for triage x1 

## 2021-05-27 NOTE — ED Notes (Signed)
Ortho paged for L elbow brace .

## 2021-05-27 NOTE — Discharge Instructions (Addendum)
Please keep the elbow in the brace that is provided.  Ice the elbow 3 or 4 times a day for 10 minutes.  Take ibuprofen 4 anti-inflammatory effects. Do not take to 800 mg tabs like you have been taking, instead take the ibuprofen as prescribed.  Call the orthopedist for a follow-up. We do not want you to use your left upper extremity with tasks like painting until cleared by orthopedics.

## 2021-05-27 NOTE — ED Notes (Addendum)
ED Provider at bedside.Waiting for Ortho Team and elbow brace placement.

## 2021-05-28 ENCOUNTER — Telehealth: Payer: Self-pay

## 2021-05-28 DIAGNOSIS — M19022 Primary osteoarthritis, left elbow: Secondary | ICD-10-CM | POA: Diagnosis not present

## 2021-05-28 NOTE — Telephone Encounter (Signed)
Transition Care Management Follow-up Telephone Call Date of discharge and from where: 05/27/2021-[facility]  How have you been since you were released from the hospital? Patient stated she is not ok but she is currently at [facility] Any questions or concerns? No  Items Reviewed: Did the pt receive and understand the discharge instructions provided? Yes  Medications obtained and verified?  No new medications given at discharge  Other? No  Any new allergies since your discharge? No  Dietary orders reviewed? No Do you have support at home? Yes   Home Care and Equipment/Supplies: Were home health services ordered? not applicable If so, what is the name of the agency? N/A  Has the agency set up a time to come to the patient's home? not applicable Were any new equipment or medical supplies ordered?  No What is the name of the medical supply agency? N/A Were you able to get the supplies/equipment? not applicable Do you have any questions related to the use of the equipment or supplies? No  Functional Questionnaire: (I = Independent and D = Dependent) ADLs: I  Bathing/Dressing- I  Meal Prep- I  Eating- I  Maintaining continence- I  Transferring/Ambulation- I  Managing Meds- I  Follow up appointments reviewed:  PCP Hospital f/u appt confirmed? No   Specialist Hospital f/u appt confirmed? Yes  Scheduled to see Raliegh Ip on 05/28/2021 @ 11:15am. Are transportation arrangements needed? No  If their condition worsens, is the pt aware to call PCP or go to the Emergency Dept.? Yes Was the patient provided with contact information for the PCP's office or ED? Yes Was to pt encouraged to call back with questions or concerns? Yes

## 2021-05-29 NOTE — ED Provider Notes (Signed)
[facility] EMERGENCY DEPARTMENT Provider Note   CSN: [id number] Arrival date & time: 05/27/21  4332     History Chief Complaint  Patient presents with   Arm Pain    Brenda [name] is a 50 y.o. female.  HPI     50 year old female comes in with chief complaint of arm pain.  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  She been having pain over her left neck, shoulder, elbow for the last few days.  There is tingling sensation in her hands.  She went to Ortho urgent care and was told that the x-rays of the neck revealed some arthritis.  She reports that her pain however primarily is in the elbow region.  Numbness and tingling is primarily over her hands.  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[place])    Migraines    Obesity    Pneumothorax, traumatic    SVD (spontaneous vaginal delivery)    x 4    Patient Active Problem List   Diagnosis Date Noted   Healthcare maintenance 01/15/2021   Dysplasia of cervix, low grade (CIN 1) 11/23/2019   Flank pain 08/07/2018   Depression 02/02/2018   Iron deficiency anemia due to chronic blood loss - hemorhoids and menses 09/21/2016   Prolapsed internal hemorrhoids, grade 3 - bleeding 09/21/2016   Syncope 08/14/2015   Rash and nonspecific skin eruption 06/30/2011   Obesity    MENORRHAGIA 09/04/2008   Diarrhea 11/28/2007   Insulin dependent type 2 diabetes mellitus, uncontrolled ([place]) 02/20/2007   Human immunodeficiency virus (HIV) disease ([place]) 03/11/2006   Migraine  headache 95/18/8416   FOLLICULITIS 60/63/0160    Past Surgical History:  Procedure Laterality Date   CHEST TUBE INSERTION     and removal of chest tube   COLONOSCOPY     HEMORRHOID BANDING     HYSTEROSCOPY WITH D & C  Removal of Endometrial Polyp   Dr. Raphael Gibney 2011   HYSTEROSCOPY WITH D & C N/A 04/12/2018   Procedure: DILATATION AND CURETTAGE /HYSTEROSCOPY;  Surgeon: Ena Dawley, MD;  Location: [facility];  Service: Gynecology;  Laterality: N/A;   LAPAROSCOPIC LYSIS OF ADHESIONS  01/10/2014   Procedure: LAPAROSCOPIC LYSIS OF ADHESIONS;  Surgeon: Ena Dawley, MD;  Location: [facility];  Service: Gynecology;;   LAPAROSCOPY N/A 01/10/2014   Procedure: LAPAROSCOPY OPERATIVE;  Surgeon: Ena Dawley, MD;  Location: [facility];  Service: Gynecology;  Laterality: N/A;   TUBAL LIGATION     UMBILICAL HERNIA REPAIR  01/10/2014   Procedure: HERNIA REPAIR UMBILICAL ADULT;  Surgeon: Ena Dawley, MD;  Location: [facility];  Service: Gynecology;;   WISDOM TOOTH EXTRACTION       OB History     Gravida  8   Para  4   Term  4   Preterm      AB  4   Living  4      SAB  4   IAB      Ectopic      Multiple      Live Births              Family History  Problem Relation Age of Onset   Diabetes Mother    Heart failure Mother    Heart failure Father    Diabetes Father    Heart failure Brother    Diabetes Brother    Colon cancer Other        neg hx.   Diabetes Maternal Grandmother    Alzheimer's disease Paternal Grandmother     Social History   Tobacco Use   Smoking status: Never   Smokeless tobacco: Never  Vaping Use   Vaping Use: Never used  Substance Use Topics   Alcohol use: No    Alcohol/week: 0.0 standard drinks   Drug use: No    Home Medications Prior to Admission medications   Medication Sig Start Date End Date Taking? Authorizing Provider  ACCU-CHEK GUIDE test strip 3 (three) times daily. for testing 10/18/18   [provider]  atorvastatin  (LIPITOR) 20 MG tablet atorvastatin 20 mg tablet  TAKE 1 TABLET BY MOUTH ONCE DAILY Patient not taking: Reported on 02/16/2021    [provider]  Buprenorphine HCl (BELBUCA) 75 MCG FILM [place] 75 mcg buccal film  USE ONE FILM BY MOUTH TWICE DAILY. USE JUST 1 AT BEDTIME FOR FIRST 4 DAYS THEN TWICE DAILY Patient not taking: Reported on 02/16/2021    [provider]  diphenhydrAMINE (BENADRYL) 25 mg capsule Take 50 mg by mouth daily as needed for allergies.    [provider]  elvitegravir-cobicistat-emtricitabine-tenofovir (GENVOYA) 150-150-200-10 MG TABS tablet TAKE 1 TABLET BY MOUTH DAILY WITH BREAKFAST. 08/28/20 08/28/21  Campbell Riches, MD  empagliflozin (JARDIANCE) 10 MG TABS tablet Take 10 mg by mouth daily.    [provider]  hydrocortisone (ANUSOL-HC) 2.5 % rectal cream Place 1 application rectally 2 (two) times daily. For hemorrhoids 07/26/18   Gatha Mayer, MD  Insulin Glargine 300 UNIT/ML SOPN Inject 30 Units into the skin every morning.     [provider]  linaclotide Rolan Lipa) 290 MCG CAPS capsule Linzess 290 mcg capsule  TK 1 C PO D Patient not taking: Reported on 02/16/2021    [provider]  losartan (COZAAR) 100 MG tablet TAKE 1 TABLET BY MOUTH ONCE DAILY THIS REPLACES LISINORIL TO PROTECT KIDNEY IN DIABETES ABOVE AND BEYOND BLOOD PRESSURE CONTROL 10/09/18   [provider]  metFORMIN (GLUCOPHAGE) 1000 MG tablet Take 1,000 mg by mouth daily. 03/31/18   [provider]  metroNIDAZOLE (FLAGYL) 500 MG tablet metronidazole 500 mg tablet  Take 1 tablet twice a day by oral route for 7 days. Patient not taking: Reported on 02/16/2021    [provider]  Multiple Vitamins-Minerals (MULTIVITAMIN PO) Take 1 tablet by mouth daily.    [provider]  naproxen (NAPROSYN) 500 MG tablet Take 1 tablet (500 mg total) by mouth 2 (two) times daily. Patient not taking: Reported on 02/16/2021 10/07/19   Wieters,  Hallie C, PA-C  NOVOLOG FLEXPEN 100 UNIT/ML FlexPen INJECT 40 UNITS SUBCUTANEOUSLY THREE TIMES DAILY Patient not taking: Reported on 02/16/2021 10/09/18   [provider]  Omega-3 Fatty Acids (FISH OIL) 1000 MG CAPS Take 1,000 mg by mouth daily. Patient not taking: Reported on 02/16/2021    [provider]  ondansetron [facility]  ODT) 4 MG disintegrating tablet Take 1 tablet (4 mg total) by mouth every 8 (eight) hours as needed for nausea or vomiting. 02/16/21   Pieter Partridge, DO  oxyCODONE-acetaminophen (PERCOCET) 10-325 MG tablet Take 1 tablet by mouth 4 (four) times daily as needed for pain.    [provider]  predniSONE (STERAPRED UNI-PAK 21 TAB) 10 MG (21) TBPK tablet Take 10 mg by mouth as directed. Patient not taking: Reported on 02/16/2021 12/28/18   [provider]  prochlorperazine (COMPAZINE) 10 MG tablet Take 1 tablet (10 mg total) by mouth 2 (two) times daily as needed for nausea. Patient not taking: Reported on 02/16/2021 08/10/18   Maudie Flakes, MD  rizatriptan (MAXALT-MLT) 10 MG disintegrating tablet Take 1 tablet (10 mg total) by mouth as needed for migraine (May repeat in 2 hours.  Maximum 2 tablets in 24 hours.). May repeat in 2 hours if needed 02/16/21   Pieter Partridge, DO  Semaglutide, 1 MG/DOSE, 2 MG/1.5ML SOPN Inject 1 mg into the skin every Monday.    [provider]  sertraline (ZOLOFT) 100 MG tablet Take 100 mg by mouth at bedtime. Patient not taking: Reported on 02/16/2021    [provider]  tiZANidine (ZANAFLEX) 4 MG tablet Take 1 tablet (4 mg total) by mouth every 6 (six) hours as needed for muscle spasms. Patient not taking: Reported on 02/16/2021 10/07/19   Wieters, Madelynn Done C, PA-C  traZODone (DESYREL) 100 MG tablet Take 100 mg by mouth at bedtime. 11/05/16   [provider]  valACYclovir (VALTREX) 500 MG tablet TK 2 TS PO BID FOR 3 DAYS THEN TK 1 T PO QD Patient not taking: Reported on 02/16/2021 08/14/18   [provider]  VOLTAREN 1 % GEL Apply 2 g topically 4 (four) times daily as needed (for knee pain).  Patient not taking: Reported on 02/16/2021 08/03/16   [provider]  VOLTAREN 1 % GEL Apply  as directed to affected area three times a day Patient not taking: Reported on 02/16/2021 04/25/19   [provider]    Allergies    Cortisone, Hydrocodone-acetaminophen, and Morphine  Review of Systems   Review of Systems  Constitutional:  Positive for activity change.  Respiratory:  Negative for shortness of breath.   Cardiovascular:  Negative for chest pain.  Musculoskeletal:  Positive for arthralgias and myalgias.  Neurological:  Positive for numbness.   Physical Exam Updated Vital Signs BP (!) 149/96 (BP Location: Right Arm)    Pulse 86    Temp 98.5 F (36.9 C) (Oral)    Resp 18    Ht 5\' 7"  (1.702 m)    Wt 101.2 kg    SpO2 97%    BMI 34.93 kg/m   Physical Exam Vitals and nursing note reviewed.  Constitutional:      Appearance: She is well-developed.  HENT:     Head: Atraumatic.  Neck:     Comments: No midline c-spine tenderness, pt able to turn head to 45 degrees bilaterally without any pain and able to flex neck to the chest and extend without any pain or neurologic symptoms.  Cardiovascular:     Rate and Rhythm: Normal rate.  Pulmonary:     Effort: Pulmonary effort is normal.  Musculoskeletal:        General: Tenderness present. No swelling or deformity.     Cervical back: Normal range of motion and neck supple.     Comments: Patient has tenderness with  palpation over the left elbow, specifically over the lateral epicondyle.  She has difficulty, but able to complete pronation, supination and elbow flexion.  No joint effusion appreciated.  Reproducible paresthesias over the palmar aspect of hand with palpation of the left elbow antecubital fossa overlying the median nerve distribution and also over the medial wrist over the median nerve distribution.  Patient  able to abduct and adduct, flex and extend all digits.   Skin:    General: Skin is warm and dry.  Neurological:     Mental Status: She is alert and oriented to person, place, and time.    ED Results / Procedures / Treatments   Labs (all labs ordered are listed, but only abnormal results are displayed) Labs Reviewed - No data to display  EKG None  Radiology DG Elbow Complete Left  Result Date: 05/27/2021 CLINICAL DATA:  A 50 year old female presents with pain to the LEFT elbow with swelling for a few weeks, reported worsening. EXAM: LEFT ELBOW - COMPLETE 3+ VIEW COMPARISON:  None FINDINGS: Degenerative changes about the elbow, mild-to-moderate. No signs of acute fracture or dislocation. No joint effusion. No substantial soft tissue swelling. Mild enthesopathy upon the olecranon. IMPRESSION: Mild to moderate degenerative changes about the left elbow, mild-to-moderate. No acute findings. Electronically Signed   By: Zetta Bills M.D.   On: 05/27/2021 10:24   DG Shoulder Left  Result Date: 05/27/2021 CLINICAL DATA:  Pain top of the shoulder EXAM: LEFT SHOULDER - 2+ VIEW COMPARISON:  None. FINDINGS: There is no evidence of fracture or dislocation. There is no evidence of arthropathy or other focal bone abnormality. Small rounded calcification about the expected location of the rotator cuff muscle attachment, which may represent calcific tendinitis. IMPRESSION: 1. No acute fracture or dislocation. 2. Findings suspicious for calcific tendinitis. Electronically Signed   By: Keane Police D.O.   On: 05/27/2021 10:24    Procedures Procedures   Medications Ordered in ED Medications  oxyCODONE-acetaminophen (PERCOCET/ROXICET) 5-325 MG per tablet 1 tablet (1 tablet Oral Given 05/27/21 1142)    ED Course  I have reviewed the triage vital signs and the nursing notes.  Pertinent labs & imaging results that were available during my care of the patient were reviewed by me and considered in my  medical decision making (see chart for details).    MDM Rules/Calculators/A&P  50 year old female comes in with chief complaint of arm pain. Based on on exam, most likely the etiology of her discomfort is elbow tendinitis, possible median nerve entrapment over there.  I have ordered elbow brace for the patient.  [place] controlled database reveals that patient is already on narcotic prescription, we will not prescribe any further narcotics.  NSAIDs, ice and follow-up with orthopedic service requested.     Final Clinical Impression(s) / ED Diagnoses Final diagnoses:  Left elbow tendinitis  Median nerve entrapment    Rx / DC Orders ED Discharge Orders     None        Varney Biles, MD 05/29/21 [phone]

## 2021-06-03 ENCOUNTER — Encounter: Payer: Self-pay | Admitting: Hematology and Oncology

## 2021-06-09 ENCOUNTER — Other Ambulatory Visit (HOSPITAL_COMMUNITY): Payer: Self-pay

## 2021-06-10 DIAGNOSIS — G8929 Other chronic pain: Secondary | ICD-10-CM | POA: Diagnosis not present

## 2021-06-10 DIAGNOSIS — R110 Nausea: Secondary | ICD-10-CM | POA: Diagnosis not present

## 2021-06-10 DIAGNOSIS — Z79899 Other long term (current) drug therapy: Secondary | ICD-10-CM | POA: Diagnosis not present

## 2021-06-10 DIAGNOSIS — M545 Low back pain, unspecified: Secondary | ICD-10-CM | POA: Diagnosis not present

## 2021-06-12 ENCOUNTER — Other Ambulatory Visit (HOSPITAL_COMMUNITY): Payer: Self-pay

## 2021-06-12 ENCOUNTER — Encounter: Payer: Self-pay | Admitting: Hematology and Oncology

## 2021-06-12 DIAGNOSIS — M5412 Radiculopathy, cervical region: Secondary | ICD-10-CM | POA: Diagnosis not present

## 2021-06-12 DIAGNOSIS — I10 Essential (primary) hypertension: Secondary | ICD-10-CM | POA: Diagnosis not present

## 2021-06-12 DIAGNOSIS — Z6832 Body mass index (BMI) 32.0-32.9, adult: Secondary | ICD-10-CM | POA: Diagnosis not present

## 2021-06-12 MED FILL — Elvitegrav-Cobic-Emtricitab-Tenofov AF Tab 150-150-200-10 MG: ORAL | 30 days supply | Qty: 30 | Fill #2 | Status: AC

## 2021-06-12 MED FILL — Elvitegrav-Cobic-Emtricitab-Tenofov AF Tab 150-150-200-10 MG: ORAL | 30 days supply | Qty: 30 | Fill #2 | Status: CN

## 2021-06-16 DIAGNOSIS — Z79899 Other long term (current) drug therapy: Secondary | ICD-10-CM | POA: Diagnosis not present

## 2021-06-25 DIAGNOSIS — M5412 Radiculopathy, cervical region: Secondary | ICD-10-CM | POA: Diagnosis not present

## 2021-06-25 DIAGNOSIS — R200 Anesthesia of skin: Secondary | ICD-10-CM | POA: Diagnosis not present

## 2021-06-25 DIAGNOSIS — M542 Cervicalgia: Secondary | ICD-10-CM | POA: Diagnosis not present

## 2021-06-25 DIAGNOSIS — R202 Paresthesia of skin: Secondary | ICD-10-CM | POA: Diagnosis not present

## 2021-06-26 ENCOUNTER — Ambulatory Visit: Payer: Medicaid Other

## 2021-06-26 NOTE — Therapy (Incomplete)
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 Raphael Gibney 2011   HYSTEROSCOPY WITH D & C N/A 04/12/2018   Procedure: DILATATION AND CURETTAGE /HYSTEROSCOPY;  Surgeon: Ena Dawley, MD;  Location: [facility];  Service: Gynecology;  Laterality: N/A;   LAPAROSCOPIC LYSIS OF ADHESIONS  01/10/2014   Procedure: LAPAROSCOPIC LYSIS OF ADHESIONS;  Surgeon: Ena Dawley, MD;  Location: [facility];  Service: Gynecology;;   LAPAROSCOPY N/A 01/10/2014   Procedure: LAPAROSCOPY OPERATIVE;  Surgeon: Ena Dawley, MD;  Location: [facility];  Service: Gynecology;  Laterality: N/A;   TUBAL LIGATION     UMBILICAL HERNIA REPAIR  01/10/2014   Procedure: HERNIA REPAIR UMBILICAL ADULT;  Surgeon: Ena Dawley, MD;  Location: [facility];  Service: Gynecology;;   [place] EXTRACTION     Patient Active Problem List   Diagnosis Date Noted   Healthcare maintenance 01/15/2021   Dysplasia of cervix, low grade (CIN 1) 11/23/2019   Flank pain 08/07/2018   Depression 02/02/2018   Iron deficiency anemia due to chronic blood loss - hemorhoids and  menses 09/21/2016   Prolapsed internal hemorrhoids, grade 3 - bleeding 09/21/2016   Syncope 08/14/2015   Rash and nonspecific skin eruption 06/30/2011   Obesity    MENORRHAGIA 09/04/2008   Diarrhea 11/28/2007   Insulin dependent type 2 diabetes mellitus, uncontrolled ([place]) 02/20/2007   Human immunodeficiency virus (HIV) disease ([place]) 03/11/2006   Migraine headache 40/98/1191   FOLLICULITIS 47/82/9562    PCP: Sherald Hess., MD  REFERRING PROVIDER: Vallarie Mare, MD  REFERRING DIAG: [phone] (ICD-10-CM) - Radiculopathy, cervical region  THERAPY DIAG:  No diagnosis found.  ONSET DATE: ***  SUBJECTIVE:  SUBJECTIVE STATEMENT: ***  PERTINENT HISTORY:  ***  PAIN:  Are you having pain? {yes/no:20286} NPRS scale: ***/10 Pain location: *** Pain orientation: {Pain Orientation:25161}  PAIN TYPE: {type:313116} Pain description: {PAIN DESCRIPTION:21022940}  Aggravating factors: *** Relieving factors: ***  PRECAUTIONS: {Therapy precautions:24002}  WEIGHT BEARING RESTRICTIONS {Yes ***/No:24003}  FALLS:  Has patient fallen in last 6 months? {yes/no:20286} Number of falls: ***  LIVING ENVIRONMENT: Lives with: {[facility] lives with:25569::"lives with their family"} Lives in: {Lives in:25570} Stairs: {yes/no:20286}; {Stairs:24000} Has following equipment at home: {Assistive devices:23999}  OCCUPATION: ***  PLOF: {PLOF:24004}  PATIENT GOALS ***  OBJECTIVE:  CT 07/25/17 Cervical spine DIAGNOSTIC FINDINGS:  CT CERVICAL SPINE FINDINGS   Alignment: Patient is in flexion.  No subluxation   Skull base and vertebrae: No acute fracture. No primary bone lesion or focal pathologic process.   Soft tissues and spinal canal: No prevertebral fluid or swelling. No visible canal  hematoma.   Disc levels:  Unremarkable   Upper chest: Negative.   Other: None   IMPRESSION: 1. No evidence of intracranial abnormality 2. Unremarkable CT cervical spine. 3. 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  PATIENT SURVEYS:  {rehab surveys:24030}   COGNITION: Overall cognitive status: {cognition:24006}   SENSATION: Light touch: {intact/deficits:24005} Hot/Cold: {intact/deficits:24005} Proprioception: {intact/deficits:24005}  POSTURE:  ***  PALPATION: ***   CERVICAL AROM/PROM  A/PROM A/PROM (deg) 06/26/2021  Flexion   Extension   Right lateral flexion   Left lateral flexion   Right rotation   Left rotation    (Blank rows = not tested)  UE AROM/PROM:  A/PROM Right 06/26/2021 Left 06/26/2021  Shoulder flexion    Shoulder extension    Shoulder abduction    Shoulder adduction    Shoulder extension    Shoulder internal rotation    Shoulder external rotation    Elbow flexion    Elbow extension    Wrist flexion    Wrist extension    Wrist ulnar deviation    Wrist radial deviation    Wrist pronation    Wrist supination     (Blank rows = not tested)  UE MMT:  MMT Right 06/26/2021 Left 06/26/2021  Shoulder flexion    Shoulder extension    Shoulder abduction    Shoulder adduction    Shoulder extension    Middle trapezius    Lower trapezius    Elbow flexion    Elbow extension    Wrist flexion    Wrist extension    Wrist ulnar deviation    Wrist radial deviation    Wrist pronation    Wrist supination    Grip strength     (Blank rows = not tested)  CERVICAL SPECIAL TESTS:  {Cervical special tests:25246}   FUNCTIONAL TESTS:  {Functional tests:24029}    TODAY'S TREATMENT:  ***   PATIENT EDUCATION:  Education details: *** Person educated: {Person educated:25204} Education method: {Education Method:25205} Education comprehension: {Education Comprehension:25206}   HOME EXERCISE  PROGRAM: ***  ASSESSMENT:  CLINICAL IMPRESSION: Patient is a *** y.o. *** who was seen today for physical therapy evaluation and treatment for ***. Objective impairments include {opptimpairments:25111}. These impairments are limiting patient from {activity limitations:25113}. Personal factors including {Personal factors:25162} are also affecting patient's functional outcome. Patient will benefit from skilled PT to address above impairments and improve overall function.  REHAB POTENTIAL: {rehabpotential:25112}  CLINICAL DECISION MAKING: {clinical decision making:25114}  EVALUATION COMPLEXITY: {Evaluation complexity:25115}   GOALS: Goals reviewed with patient? {yes/no:20286}  SHORT TERM GOALS:  STG Name  Target Date Goal status  1 *** Baseline:  {follow up:25551} {GOALSTATUS:25110}  2 *** Baseline:  {follow up:25551} {GOALSTATUS:25110}  3 *** Baseline: {follow up:25551} {GOALSTATUS:25110}  4 *** Baseline: {follow up:25551} {GOALSTATUS:25110}  5 *** Baseline: {follow up:25551} {GOALSTATUS:25110}  6 *** Baseline: {follow up:25551} {GOALSTATUS:25110}  7 *** Baseline: {follow up:25551} {GOALSTATUS:25110}   LONG TERM GOALS:   LTG Name Target Date Goal status  1 *** Baseline: {follow up:25551} {GOALSTATUS:25110}  2 *** Baseline: {follow up:25551} {GOALSTATUS:25110}  3 *** Baseline: {follow up:25551} {GOALSTATUS:25110}  4 *** Baseline: {follow up:25551} {GOALSTATUS:25110}  5 *** Baseline: {follow up:25551} {GOALSTATUS:25110}  6 *** Baseline: {follow up:25551} {GOALSTATUS:25110}  7 *** Baseline: {follow up:25551} {GOALSTATUS:25110}   PLAN: PT FREQUENCY: {rehab frequency:25116}  PT DURATION: {rehab duration:25117}  PLANNED INTERVENTIONS: {rehab planned interventions:25118::"Therapeutic exercises","Therapeutic activity","Neuro Muscular re-education","Balance training","Gait training","Patient/Family education","Joint mobilization"}  PLAN FOR NEXT SESSION:  ***   Keegen Heffern 06/26/2021, 6:11 AM

## 2021-07-02 ENCOUNTER — Other Ambulatory Visit (HOSPITAL_COMMUNITY): Payer: Self-pay

## 2021-07-02 MED FILL — Elvitegrav-Cobic-Emtricitab-Tenofov AF Tab 150-150-200-10 MG: ORAL | 30 days supply | Qty: 30 | Fill #3 | Status: AC

## 2021-07-07 ENCOUNTER — Other Ambulatory Visit (HOSPITAL_COMMUNITY): Payer: Self-pay

## 2021-07-07 ENCOUNTER — Encounter: Payer: Self-pay | Admitting: Hematology and Oncology

## 2021-07-08 ENCOUNTER — Other Ambulatory Visit (HOSPITAL_COMMUNITY): Payer: Self-pay

## 2021-07-09 ENCOUNTER — Other Ambulatory Visit (HOSPITAL_COMMUNITY): Payer: Self-pay

## 2021-07-10 ENCOUNTER — Other Ambulatory Visit (HOSPITAL_COMMUNITY): Payer: Self-pay

## 2021-07-10 DIAGNOSIS — G4700 Insomnia, unspecified: Secondary | ICD-10-CM | POA: Diagnosis not present

## 2021-07-10 DIAGNOSIS — M545 Low back pain, unspecified: Secondary | ICD-10-CM | POA: Diagnosis not present

## 2021-07-10 DIAGNOSIS — G8929 Other chronic pain: Secondary | ICD-10-CM | POA: Diagnosis not present

## 2021-07-10 DIAGNOSIS — R110 Nausea: Secondary | ICD-10-CM | POA: Diagnosis not present

## 2021-07-10 DIAGNOSIS — Z79899 Other long term (current) drug therapy: Secondary | ICD-10-CM | POA: Diagnosis not present

## 2021-07-10 DIAGNOSIS — Z6833 Body mass index (BMI) 33.0-33.9, adult: Secondary | ICD-10-CM | POA: Diagnosis not present

## 2021-07-14 ENCOUNTER — Other Ambulatory Visit (HOSPITAL_COMMUNITY): Payer: Self-pay

## 2021-07-14 ENCOUNTER — Encounter: Payer: Self-pay | Admitting: Hematology and Oncology

## 2021-07-14 ENCOUNTER — Telehealth: Payer: Self-pay

## 2021-07-14 NOTE — Telephone Encounter (Signed)
[facility] Patient Advocate Encounter °  °Was successful in obtaining a [employer] copay card for [employer].  This copay card will make the patients copay 0.00. ° °I have spoken with the patient.   ° °The billing information is as follows and has been shared with [facility]. ° ° ° ° ° ° ° °Para Cossey, CPhT °Specialty Pharmacy Patient Advocate °[facility] °Phone: [phone] °Fax:  [phone]  °

## 2021-07-16 ENCOUNTER — Ambulatory Visit: Payer: Medicaid Other

## 2021-07-16 NOTE — Therapy (Incomplete)
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)    Migraines    Obesity    Pneumothorax, traumatic    SVD (spontaneous vaginal delivery)    x 4   Past Surgical History:  Procedure Laterality Date   CHEST TUBE INSERTION     and removal of chest tube   COLONOSCOPY     HEMORRHOID BANDING     HYSTEROSCOPY WITH D & C  Removal of Endometrial Polyp   Dr. Raphael Gibney 2011   HYSTEROSCOPY WITH D & C N/A 04/12/2018   Procedure: DILATATION AND CURETTAGE /HYSTEROSCOPY;  Surgeon: Ena Dawley, MD;  Location: [facility];  Service: Gynecology;  Laterality: N/A;   LAPAROSCOPIC LYSIS OF ADHESIONS  01/10/2014   Procedure: LAPAROSCOPIC LYSIS OF ADHESIONS;  Surgeon: Ena Dawley, MD;  Location: [facility];  Service: Gynecology;;   LAPAROSCOPY N/A 01/10/2014   Procedure: LAPAROSCOPY OPERATIVE;  Surgeon: Ena Dawley, MD;  Location: [facility];  Service: Gynecology;  Laterality: N/A;   TUBAL LIGATION     UMBILICAL HERNIA REPAIR  01/10/2014   Procedure: HERNIA REPAIR UMBILICAL ADULT;  Surgeon: Ena Dawley, MD;  Location: [facility];  Service: Gynecology;;   [place] EXTRACTION     Patient Active Problem List   Diagnosis Date Noted   Healthcare maintenance 01/15/2021   Dysplasia of cervix, low grade (CIN 1) 11/23/2019   Flank pain 08/07/2018   Depression 02/02/2018   Iron deficiency anemia due to chronic blood loss - hemorhoids and  menses 09/21/2016   Prolapsed internal hemorrhoids, grade 3 - bleeding 09/21/2016   Syncope 08/14/2015   Rash and nonspecific skin eruption 06/30/2011   Obesity    MENORRHAGIA 09/04/2008   Diarrhea 11/28/2007   Insulin dependent type 2 diabetes mellitus, uncontrolled ([place]) 02/20/2007   Human immunodeficiency virus (HIV) disease ([place]) 03/11/2006   Migraine headache 65/68/1275   FOLLICULITIS 17/00/1749    PCP: Sherald Hess., MD  REFERRING PROVIDER: Vallarie Mare, MD  REFERRING DIAG: Radiculopathy, cervical region  THERAPY DIAG:  No diagnosis found.  ONSET DATE: ***  SUBJECTIVE:  SUBJECTIVE STATEMENT: ***  PERTINENT HISTORY:  ***  PAIN:  Are you having pain? {yes/no:20286} NPRS scale: ***/10 Pain location: *** Pain orientation: {Pain Orientation:25161}  PAIN TYPE: {type:313116} Pain description: {PAIN DESCRIPTION:21022940}  Aggravating factors: *** Relieving factors: ***  PRECAUTIONS: {Therapy precautions:24002}  WEIGHT BEARING RESTRICTIONS {Yes ***/No:24003}  FALLS:  Has patient fallen in last 6 months? {yes/no:20286} Number of falls: ***  LIVING ENVIRONMENT: Lives with: {[facility] lives with:25569::"lives with their family"} Lives in: {Lives in:25570} Stairs: {yes/no:20286}; {Stairs:24000} Has following equipment at home: {Assistive devices:23999}  OCCUPATION: ***  PLOF: {PLOF:24004}  PATIENT GOALS ***  OBJECTIVE:   DIAGNOSTIC FINDINGS:  CT CERVICAL SPINE FINDINGS   Alignment: Patient is in flexion.  No subluxation   Skull base and vertebrae: No acute fracture. No primary bone lesion or focal pathologic process.   Soft tissues and spinal canal: No prevertebral fluid or swelling. No visible canal hematoma.   Disc levels:  Unremarkable    Upper chest: Negative.   Other: None   IMPRESSION: 1. No evidence of intracranial abnormality 2. Unremarkable CT cervical spine. 3. 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     Electronically Signed   By: Margarette Canada M.D.   On: 07/25/2017 16:57  PATIENT SURVEYS:  {rehab surveys:24030}   COGNITION: Overall cognitive status: {cognition:24006}   SENSATION: Light touch: {intact/deficits:24005} Hot/Cold: {intact/deficits:24005} Proprioception: {intact/deficits:24005}  POSTURE:  ***  PALPATION: ***   CERVICAL AROM/PROM  A/PROM A/PROM (deg) 07/16/2021  Flexion   Extension   Right lateral flexion   Left lateral flexion   Right rotation   Left rotation    (Blank rows = not tested)  UE AROM/PROM:  A/PROM Right 07/16/2021 Left 07/16/2021  Shoulder flexion    Shoulder extension    Shoulder abduction    Shoulder adduction    Shoulder extension    Shoulder internal rotation    Shoulder external rotation    Elbow flexion    Elbow extension    Wrist flexion    Wrist extension    Wrist ulnar deviation    Wrist radial deviation    Wrist pronation    Wrist supination     (Blank rows = not tested)  UE MMT:  MMT Right 07/16/2021 Left 07/16/2021  Shoulder flexion    Shoulder extension    Shoulder abduction    Shoulder adduction    Shoulder extension    Shoulder internal rotation    Shoulder external rotation    Middle trapezius    Lower trapezius    Elbow flexion    Elbow extension    Wrist flexion    Wrist extension    Wrist ulnar deviation    Wrist radial deviation    Wrist pronation    Wrist supination    Grip strength     (Blank rows = not tested)  CERVICAL SPECIAL TESTS:  {Cervical special tests:25246}   FUNCTIONAL TESTS:  {Functional tests:24029}    TODAY'S TREATMENT:  ***   PATIENT EDUCATION:  Education details: *** Person educated: {Person educated:25204} Education method: {Education Method:25205} Education  comprehension: {Education Comprehension:25206}   HOME EXERCISE PROGRAM: ***  ASSESSMENT:  CLINICAL IMPRESSION: Patient is a *** y.o. *** who was seen today for physical therapy evaluation and treatment for ***. Objective impairments include {opptimpairments:25111}. These impairments are limiting patient from {activity limitations:25113}. Personal factors including {Personal factors:25162} are also affecting patient's functional outcome. Patient will benefit from skilled PT to address above impairments and improve overall function.  REHAB POTENTIAL: {rehabpotential:25112}  CLINICAL DECISION MAKING: {clinical decision making:25114}  EVALUATION COMPLEXITY: {Evaluation complexity:25115}   GOALS: Goals reviewed with patient? {yes/no:20286}  SHORT TERM GOALS:  STG Name Target Date Goal status  1 *** Baseline:  {follow up:25551} {GOALSTATUS:25110}  2 *** Baseline:  {follow up:25551} {GOALSTATUS:25110}  3 *** Baseline: {follow up:25551} {GOALSTATUS:25110}  4 *** Baseline: {follow up:25551} {GOALSTATUS:25110}  5 *** Baseline: {follow up:25551} {GOALSTATUS:25110}  6 *** Baseline: {follow up:25551} {GOALSTATUS:25110}  7 *** Baseline: {follow up:25551} {GOALSTATUS:25110}   LONG TERM GOALS:   LTG Name Target Date Goal status  1 *** Baseline: {follow up:25551} {GOALSTATUS:25110}  2 *** Baseline: {follow up:25551} {GOALSTATUS:25110}  3 *** Baseline: {follow up:25551} {GOALSTATUS:25110}  4 *** Baseline: {follow up:25551} {GOALSTATUS:25110}  5 *** Baseline: {follow up:25551} {GOALSTATUS:25110}  6 *** Baseline: {follow up:25551} {GOALSTATUS:25110}  7 *** Baseline: {follow up:25551} {GOALSTATUS:25110}   PLAN: PT FREQUENCY: {rehab frequency:25116}  PT DURATION: {rehab duration:25117}  PLANNED INTERVENTIONS: {rehab planned interventions:25118::"Therapeutic exercises","Therapeutic activity","Neuro Muscular re-education","Balance training","Gait training","Patient/Family  education","Joint mobilization"}  PLAN FOR NEXT SESSION: ***   Livian Vanderbeck, PT 07/16/2021, 8:24 AM

## 2021-07-30 ENCOUNTER — Encounter: Payer: Self-pay | Admitting: Internal Medicine

## 2021-08-03 ENCOUNTER — Other Ambulatory Visit (HOSPITAL_COMMUNITY): Payer: Self-pay

## 2021-08-05 ENCOUNTER — Other Ambulatory Visit (HOSPITAL_COMMUNITY): Payer: Self-pay

## 2021-08-05 MED FILL — Elvitegrav-Cobic-Emtricitab-Tenofov AF Tab 150-150-200-10 MG: ORAL | 30 days supply | Qty: 30 | Fill #4 | Status: AC

## 2021-08-10 ENCOUNTER — Other Ambulatory Visit (HOSPITAL_COMMUNITY): Payer: Self-pay

## 2021-08-12 ENCOUNTER — Encounter: Payer: Self-pay | Admitting: Hematology and Oncology

## 2021-08-26 ENCOUNTER — Ambulatory Visit: Payer: Medicare Other | Admitting: Neurology

## 2021-09-01 ENCOUNTER — Other Ambulatory Visit: Payer: Self-pay

## 2021-09-01 ENCOUNTER — Ambulatory Visit (AMBULATORY_SURGERY_CENTER): Payer: Medicare Other | Admitting: *Deleted

## 2021-09-01 VITALS — Ht 68.0 in | Wt 225.0 lb

## 2021-09-01 DIAGNOSIS — Z1211 Encounter for screening for malignant neoplasm of colon: Secondary | ICD-10-CM

## 2021-09-01 MED ORDER — NA SULFATE-K SULFATE-MG SULF 17.5-3.13-1.6 GM/177ML PO SOLN: 1.0000 | Freq: Once | ORAL | 0 refills | Status: AC

## 2021-09-01 NOTE — Progress Notes (Signed)

## 2021-09-04 ENCOUNTER — Other Ambulatory Visit (HOSPITAL_COMMUNITY): Payer: Self-pay

## 2021-09-07 ENCOUNTER — Other Ambulatory Visit (HOSPITAL_COMMUNITY): Payer: Self-pay

## 2021-09-07 ENCOUNTER — Encounter: Payer: Self-pay | Admitting: Hematology and Oncology

## 2021-09-15 ENCOUNTER — Encounter: Payer: Self-pay | Admitting: Hematology and Oncology

## 2021-09-15 ENCOUNTER — Other Ambulatory Visit: Payer: Self-pay

## 2021-09-15 ENCOUNTER — Ambulatory Visit (AMBULATORY_SURGERY_CENTER): Payer: Medicare Other | Admitting: Internal Medicine

## 2021-09-15 ENCOUNTER — Encounter: Payer: Self-pay | Admitting: Internal Medicine

## 2021-09-15 VITALS — BP 157/94 | HR 66 | Temp 96.8°F | Resp 12 | Ht 68.0 in | Wt 225.0 lb

## 2021-09-15 DIAGNOSIS — Z1211 Encounter for screening for malignant neoplasm of colon: Secondary | ICD-10-CM

## 2021-09-15 DIAGNOSIS — Z538 Procedure and treatment not carried out for other reasons: Secondary | ICD-10-CM | POA: Diagnosis not present

## 2021-09-15 MED ORDER — SODIUM CHLORIDE 0.9 % IV SOLN: 500.0000 mL | Freq: Once | INTRAVENOUS | Status: DC

## 2021-09-15 MED ORDER — FLEET ENEMA 7-19 GM/118ML RE ENEM
1.0000 | ENEMA | Freq: Once | RECTAL | Status: AC
  Administered 2021-09-15: 1 via RECTAL

## 2021-09-15 NOTE — Op Note (Signed)
[name] ?Patient Name: Brenda [name] ?Procedure Date: 09/15/2021 9:10 AM ?MRN: [id number] ?Endoscopist: Gatha Mayer , MD ?Age: 51 ?Referring MD:  ?Date of Birth: Oct 07, 1970 ?Gender: Female ?Account #: [id number] ?Procedure:                Colonoscopy ?Indications:              Screening for colorectal malignant neoplasm ?Medicines:                Monitored Anesthesia Care ?Procedure:                Pre-Anesthesia Assessment: ?                          - Prior to the procedure, a History and Physical  ?                          was performed, and patient medications and  ?                          allergies were reviewed. The patient's tolerance of  ?                          previous anesthesia was also reviewed. The risks  ?                          and benefits of the procedure and the sedation  ?                          options and risks were discussed with the patient.  ?                          All questions were answered, and informed consent  ?                          was obtained. Prior Anticoagulants: The patient has  ?                          taken no previous anticoagulant or antiplatelet  ?                          agents. ASA Grade Assessment: III - A patient with  ?                          severe systemic disease. After reviewing the risks  ?                          and benefits, the patient was deemed in  ?                          satisfactory condition to undergo the procedure. ?                          After obtaining informed consent, the colonoscope  ?  was passed under direct vision. Throughout the  ?                          procedure, the patient's blood pressure, pulse, and  ?                          oxygen saturations were monitored continuously. The  ?                          Olympus CF-[id number] (#[id number]) Colonoscope was  ?                          introduced through the anus with the intention of  ?                          advancing to the  cecum. The scope was advanced to  ?                          the sigmoid colon before the procedure was aborted.  ?                          Medications were given. The colonoscopy was  ?                          performed with difficulty due to inadequate bowel  ?                          prep. The patient tolerated the procedure well. The  ?                          quality of the bowel preparation was poor. ?Scope In: ?Scope Out: ?Findings:                 Hemorrhoids were found on perianal exam. ?                          Copious quantities of liquid semi-liquid stool was  ?                          found in the rectum and in the sigmoid colon,  ?                          precluding visualization. ?Complications:            No immediate complications. ?Estimated Blood Loss:     Estimated blood loss: none. ?Impression:               - Preparation of the colon was poor. ?                          - Hemorrhoids found on perianal exam. ?                          - Stool in the rectum and in the sigmoid colon. ?                          -  No specimens collected. ?Recommendation:           - Patient has a contact number available for  ?                          emergencies. The signs and symptoms of potential  ?                          delayed complications were discussed with the  ?                          patient. Return to normal activities tomorrow.  ?                          Written discharge instructions were provided to the  ?                          patient. ?                          - Resume previous diet. ?                          - Continue present medications. ?                          - SCHEDULE OFFICE VISIT W/ ME TO PLAN A BETTER PREP ?                          SHE HAD MIRALAX AND SUPREP BUT VOMITED W/ SUPREP  ?                          BOTH TIMES ?                          I THINK A WEEK OF LINZESS SAMPLES AND ADJUSTING  ?                          OZEMPIC TIMING MAY BE NEEDED ?Gatha Mayer,  MD ?09/15/2021 9:34:33 AM ?This report has been signed electronically. ?

## 2021-09-15 NOTE — Progress Notes (Signed)
VS-DT ? ?Pt's states no medical or surgical changes since previsit or office visit. ? ?Pt reports vomiting with both doses of Suprep. Tolerated Miralax and dulcolax. Reported stool was "murky brown." Enema given per MD orders. Stool cloudy yellow liquid. MD made aware. Will proceed with colonoscopy.  ?

## 2021-09-15 NOTE — Progress Notes (Signed)
[facility] History and Physical ? ? ?Primary Care Physician:  Sherald Hess., MD ? ? ?Reason for Procedure:   CRCA screening ? ?Plan:    colonoscopy ? ? ? ? ?HPI: Brenda [name] is a 51 y.o. female here for screening colonoscopy ?Had a colonoscopy 2020 - but prep fair, negative screening 2012. Because of fair prep 2020 kept original recall timing ? ?Past Medical History:  ?Diagnosis Date  ? Abdominal pain   ? left side  ? Anxiety   ? Arthritis   ? knees  ? Chronic folliculitis   ? Chronic kidney disease   ? per pt/medication damaged her left kidney  ? Depression   ? Diabetes mellitus   ? type 2  ? Hemorrhoids   ? History of blood transfusion 2015  ? MC   ? HIV disease ([place])   ? Hypertension   ? Iron deficiency anemia due to chronic blood loss   ? Irregular menstrual cycle   ? 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)   ? Migraines   ? Obesity   ? Pneumothorax, traumatic   ? SVD (spontaneous vaginal delivery)   ? x 4  ? ? ?Past Surgical History:  ?Procedure Laterality Date  ? CHEST TUBE INSERTION    ? and removal of chest tube  ? COLONOSCOPY    ? HEMORRHOID BANDING    ? HYSTEROSCOPY WITH D & C  Removal of Endometrial Polyp  ? Dr. Raphael Gibney 2011  ? HYSTEROSCOPY WITH D & C N/A 04/12/2018  ? Procedure: DILATATION AND CURETTAGE /HYSTEROSCOPY;  Surgeon: Ena Dawley, MD;  Location: [facility];  Service: Gynecology;  Laterality: N/A;  ? LAPAROSCOPIC LYSIS OF ADHESIONS  01/10/2014  ? Procedure: LAPAROSCOPIC LYSIS OF ADHESIONS;  Surgeon: Ena Dawley, MD;  Location: [facility];  Service: Gynecology;;  ? LAPAROSCOPY N/A 01/10/2014  ? Procedure: LAPAROSCOPY OPERATIVE;  Surgeon: Ena Dawley, MD;  Location: [facility];  Service: Gynecology;  Laterality: N/A;  ? TUBAL LIGATION    ? UMBILICAL HERNIA REPAIR  01/10/2014  ? Procedure: HERNIA REPAIR UMBILICAL ADULT;  Surgeon: Ena Dawley, MD;  Location: [facility];  Service: Gynecology;;  ? WISDOM TOOTH EXTRACTION    ? ? ?Prior to Admission medications   ?Medication Sig  Start Date End Date Taking? Authorizing Provider  ?ACCU-CHEK GUIDE test strip 3 (three) times daily. for testing 10/18/18  Yes [provider]  ?baclofen (LIORESAL) 10 MG tablet Take 10 mg by mouth 3 (three) times daily as needed. 08/13/21  Yes [provider]  ?empagliflozin (JARDIANCE) 25 MG TABS tablet Jardiance 25 mg tablet   Yes [provider]  ?ibuprofen (ADVIL) 800 MG tablet Take 800 mg by mouth 2 (two) times daily as needed. 07/10/21  Yes [provider]  ?Insulin Glargine 300 UNIT/ML SOPN Inject 50 Units into the skin 2 (two) times daily. toujeo   Yes [provider]  ?losartan (COZAAR) 100 MG tablet TAKE 1 TABLET BY MOUTH ONCE DAILY THIS REPLACES LISINORIL TO PROTECT KIDNEY IN DIABETES ABOVE AND BEYOND BLOOD PRESSURE CONTROL 10/09/18  Yes [provider]  ?LYRICA 100 MG capsule Take 100 mg by mouth 3 (three) times daily. 08/09/21  Yes [provider]  ?metFORMIN (GLUCOPHAGE) 1000 MG tablet Take 1,000 mg by mouth daily. 03/31/18  Yes [provider]  ?oxyCODONE-acetaminophen (PERCOCET) 10-325 MG tablet Take 1 tablet by mouth 4 (four) times daily as needed for pain.   Yes [provider]  ?OZEMPIC, 2 MG/DOSE, 8 MG/3ML SOPN  Inject 2 mg into the skin once a week. 08/17/21  Yes [provider]  ?sertraline (ZOLOFT) 100 MG tablet Take 100 mg by mouth at bedtime.   Yes [provider]  ?traZODone (DESYREL) 100 MG tablet Take 100 mg by mouth at bedtime. 11/05/16  Yes [provider]  ?Cholecalciferol 1.25 MG (50000 UT) capsule cholecalciferol (vitamin D3) 1,250 mcg (50,000 unit) capsule    [provider]  ?diphenhydrAMINE (BENADRYL) 25 mg capsule Take 50 mg by mouth daily as needed for allergies.    [provider]  ?fluconazole (DIFLUCAN) 100 MG tablet Take 100 mg by mouth daily. 07/30/21   [provider]  ?hydrocortisone (ANUSOL-HC) 2.5 % rectal cream Place 1 application rectally 2 (two)  times daily. For hemorrhoids ?Patient taking differently: Place 1 application. rectally as needed. For hemorrhoids 07/26/18   Gatha Mayer, MD  ?ondansetron (ZOFRAN ODT) 4 MG disintegrating tablet Take 1 tablet (4 mg total) by mouth every 8 (eight) hours as needed for nausea or vomiting. 02/16/21   Pieter Partridge, DO  ? ? ?Current Outpatient Medications  ?Medication Sig Dispense Refill  ? ACCU-CHEK GUIDE test strip 3 (three) times daily. for testing    ? baclofen (LIORESAL) 10 MG tablet Take 10 mg by mouth 3 (three) times daily as needed.    ? empagliflozin (JARDIANCE) 25 MG TABS tablet Jardiance 25 mg tablet    ? ibuprofen (ADVIL) 800 MG tablet Take 800 mg by mouth 2 (two) times daily as needed.    ? Insulin Glargine 300 UNIT/ML SOPN Inject 50 Units into the skin 2 (two) times daily. toujeo    ? losartan (COZAAR) 100 MG tablet TAKE 1 TABLET BY MOUTH ONCE DAILY THIS REPLACES LISINORIL TO PROTECT KIDNEY IN DIABETES ABOVE AND BEYOND BLOOD PRESSURE CONTROL    ? LYRICA 100 MG capsule Take 100 mg by mouth 3 (three) times daily.    ? metFORMIN (GLUCOPHAGE) 1000 MG tablet Take 1,000 mg by mouth daily.  0  ? oxyCODONE-acetaminophen (PERCOCET) 10-325 MG tablet Take 1 tablet by mouth 4 (four) times daily as needed for pain.    ? OZEMPIC, 2 MG/DOSE, 8 MG/3ML SOPN Inject 2 mg into the skin once a week.    ? sertraline (ZOLOFT) 100 MG tablet Take 100 mg by mouth at bedtime.    ? traZODone (DESYREL) 100 MG tablet Take 100 mg by mouth at bedtime.    ? Cholecalciferol 1.25 MG (50000 UT) capsule cholecalciferol (vitamin D3) 1,250 mcg (50,000 unit) capsule    ? diphenhydrAMINE (BENADRYL) 25 mg capsule Take 50 mg by mouth daily as needed for allergies.    ? fluconazole (DIFLUCAN) 100 MG tablet Take 100 mg by mouth daily.    ? hydrocortisone (ANUSOL-HC) 2.5 % rectal cream Place 1 application rectally 2 (two) times daily. For hemorrhoids (Patient taking differently: Place 1 application. rectally as needed. For hemorrhoids) 30 g 1  ?  ondansetron (ZOFRAN ODT) 4 MG disintegrating tablet Take 1 tablet (4 mg total) by mouth every 8 (eight) hours as needed for nausea or vomiting. 20 tablet 5  ? ?Current Facility-Administered Medications  ?Medication Dose Route Frequency Provider Last Rate Last Admin  ? 0.9 %  sodium chloride infusion  500 mL Intravenous Once Gatha Mayer, MD      ? ? ?Allergies as of 09/15/2021 - Review Complete 09/15/2021  ?Allergen Reaction Noted  ? Cortisone Itching and Other (See Comments)   ? Hydrocodone-acetaminophen Itching and Other (See Comments)   ?  Morphine Itching and Other (See Comments)   ? ? ?Family History  ?Problem Relation Age of Onset  ? Diabetes Mother   ? Heart failure Mother   ? Heart failure Father   ? Diabetes Father   ? Heart failure Brother   ? Diabetes Brother   ? Diabetes Maternal Grandmother   ? Alzheimer's disease Paternal Grandmother   ? Colon cancer Neg Hx   ? Colon polyps Neg Hx   ? Celiac disease Neg Hx   ? Crohn's disease Neg Hx   ? Esophageal cancer Neg Hx   ? Rectal cancer Neg Hx   ? Stomach cancer Neg Hx   ? ? ?Social History  ? ?Socioeconomic History  ? Marital status: Single  ?  Spouse name: Not on file  ? Number of children: 4  ? Years of education: Not on file  ? Highest education level: Not on file  ?Occupational History  ? Occupation: [employer]  ?Tobacco Use  ? Smoking status: Never  ?  Passive exposure: Never  ? Smokeless tobacco: Never  ?Vaping Use  ? Vaping Use: Never used  ?Substance and Sexual Activity  ? Alcohol use: No  ?  Alcohol/week: 0.0 standard drinks  ? Drug use: No  ? Sexual activity: Not Currently  ?  Partners: Female  ?  Birth control/protection: Condom, Surgical  ?Other Topics Concern  ? Not on file  ?Social History Narrative  ? Daily caffeine: 1 coffee/day  ? 4 children, 3 at home youngest born 2006  ? 2 grandchildren   ? Employed: Painting and cleaning   ? Education: High school  ? Single  ?   ?   ? ?Social Determinants of Health  ? ?Financial Resource Strain: Not  on file  ?Food Insecurity: Not on file  ?Transportation Needs: Not on file  ?Physical Activity: Not on file  ?Stress: Not on file  ?Social Connections: Not on file  ?Intimate Partner Violence: Not on file

## 2021-09-15 NOTE — Patient Instructions (Addendum)
I could not do the procedure - cleanout not good enough. Sorry - I know you tried. ? ?I will have you come to the office and we will plan something that works better. ? ?I appreciate the opportunity to care for you. ?Gatha Mayer, MD, [facility] ? ?YOU HAD AN ENDOSCOPIC PROCEDURE TODAY AT [facility]:   Refer to the procedure report that was given to you for any specific questions about what was found during the examination.  If the procedure report does not answer your questions, please call your gastroenterologist to clarify.  If you requested that your care partner not be given the details of your procedure findings, then the procedure report has been included in a sealed envelope for you to review at your convenience later. ? ?YOU SHOULD EXPECT: Some feelings of bloating in the abdomen. Passage of more gas than usual.  Walking can help get rid of the air that was put into your GI tract during the procedure and reduce the bloating. If you had a lower endoscopy (such as a colonoscopy or flexible sigmoidoscopy) you may notice spotting of blood in your stool or on the toilet paper. If you underwent a bowel prep for your procedure, you may not have a normal bowel movement for a few days. ? ?Please Note:  You might notice some irritation and congestion in your nose or some drainage.  This is from the oxygen used during your procedure.  There is no need for concern and it should clear up in a day or so. ? ?SYMPTOMS TO REPORT IMMEDIATELY: ? ?Following lower endoscopy (colonoscopy or flexible sigmoidoscopy): ? Excessive amounts of blood in the stool ? Significant tenderness or worsening of abdominal pains ? Swelling of the abdomen that is new, acute ? Fever of 100?F or higher ? ?For urgent or emergent issues, a gastroenterologist can be reached at any hour by calling [phone]. ?Do not use MyChart messaging for urgent concerns.  ? ? ?DIET:  We do recommend a small meal at first, but then you may  proceed to your regular diet.  Drink plenty of fluids but you should avoid alcoholic beverages for 24 hours. ? ?ACTIVITY:  You should plan to take it easy for the rest of today and you should NOT DRIVE or use heavy machinery until tomorrow (because of the sedation medicines used during the test).   ? ?FOLLOW UP: ?Our staff will call the number listed on your records 48-72 hours following your procedure to check on you and address any questions or concerns that you may have regarding the information given to you following your procedure. If we do not reach you, we will leave a message.  We will attempt to reach you two times.  During this call, we will ask if you have developed any symptoms of COVID 19. If you develop any symptoms (ie: fever, flu-like symptoms, shortness of breath, cough etc.) before then, please call [phone].  If you test positive for Covid 19 in the 2 weeks post procedure, please call and report this information to [place].   ? ?If any biopsies were taken you will be contacted by phone or by letter within the next 1-3 weeks.  Please call us at [phone] if you have not heard about the biopsies in 3 weeks.  ? ? ?SIGNATURES/CONFIDENTIALITY: ?You and/or your care partner have signed paperwork which will be entered into your electronic medical record.  These signatures attest to the fact that that the  information above on your After Visit Summary has been reviewed and is understood.  Full responsibility of the confidentiality of this discharge information lies with you and/or your care-partner.  ?

## 2021-09-15 NOTE — Progress Notes (Signed)
Pt non-responsive, VVS, Report to RN  °

## 2021-09-17 ENCOUNTER — Telehealth: Payer: Self-pay

## 2021-09-17 ENCOUNTER — Telehealth: Payer: Self-pay | Admitting: *Deleted

## 2021-09-17 NOTE — Telephone Encounter (Signed)
?  Follow up Call- ? ? ?  09/15/2021  ?  8:49 AM  ?Call back number  ?Post procedure Call Back phone  # [phone]  ?Permission to leave phone message Yes  ?  ?No answer at 2nd attempt follow up phone call.  Left message on voicemail.   ?

## 2021-09-17 NOTE — Telephone Encounter (Signed)
First attempt follow up call to pt, no answer. 

## 2021-09-17 NOTE — Telephone Encounter (Signed)
Follow up call placed, no answer, no VM. SChaplin, RN,BSN  

## 2021-09-18 ENCOUNTER — Other Ambulatory Visit (HOSPITAL_COMMUNITY): Payer: Self-pay

## 2021-09-18 ENCOUNTER — Other Ambulatory Visit: Payer: Self-pay | Admitting: Infectious Diseases

## 2021-09-18 DIAGNOSIS — B20 Human immunodeficiency virus [HIV] disease: Secondary | ICD-10-CM

## 2021-09-18 NOTE — Telephone Encounter (Signed)
DDI with oxycodone, called patient to assess if she was still taking the oxy. No answer and unable to leave message. Routing to pharmacy for advise.  ? ?Beryle Flock, RN ? ?

## 2021-09-21 ENCOUNTER — Other Ambulatory Visit: Payer: Self-pay | Admitting: Infectious Diseases

## 2021-09-21 ENCOUNTER — Other Ambulatory Visit (HOSPITAL_COMMUNITY): Payer: Self-pay

## 2021-09-21 DIAGNOSIS — B20 Human immunodeficiency virus [HIV] disease: Secondary | ICD-10-CM

## 2021-09-21 MED ORDER — GENVOYA 150-150-200-10 MG PO TABS
1.0000 | ORAL_TABLET | Freq: Every day | ORAL | 0 refills | Status: DC
  Filled 2021-09-21: qty 30, 30d supply, fill #0

## 2021-09-21 NOTE — Telephone Encounter (Signed)
There is potential for increased oxycodone concentrations; ok to monitor for increased sedation and continue prescriptions.

## 2021-09-21 NOTE — Telephone Encounter (Signed)
Called patient to notify her of DDI, no answer. Left HIPAA compliant voicemail requesting callback.  ? ?Beryle Flock, RN ? ?

## 2021-09-28 NOTE — Progress Notes (Deleted)
NEUROLOGY FOLLOW UP OFFICE NOTE  Brenda [name] [id number]  Assessment/Plan:   Migraine with aura,  without status migrainosus, not intractable   Migraine prevention:  Defer as migraines are not frequent Migraine rescue:  Maxalt MLT '10mg'$  with Zofran ODT '4mg'$ .  If ineffective, would try Nurtec or Ubrelvy Limit use of pain relievers to no more than 2 days out of week to prevent risk of rebound or medication-overuse headache. Keep headache diary Follow up 6 months.       Subjective:  Brenda [name] is a 51 year old left-handed female with CKD, DM II, HIV, HTN, depression/anxiety and history of cryptococcus meningitis who follows up for migraines.  UPDATE: Intensity:  *** Duration:  *** Frequency:  *** Frequency of abortive medication: *** Rescue protocol:  Tylenol Cold and Sinus, Benadryl, ibuprofen Current NSAIDS/analgesics:  Tylenol, ibuprofen, Percocet (chronic pain) Current triptans:  rizatriptan '10mg'$  Current ergotamine:  none Current anti-emetic:  Zofran '4mg'$  Current muscle relaxants:  baclofen '10mg'$  TID PRN Current Antihypertensive medications:  Losartan Current Antidepressant medications:  trazodone '100mg'$  QHS Current Anticonvulsant medications:   Current anti-CGRP:  none Current Vitamins/Herbal/Supplements:  D3 Current Antihistamines/Decongestants:  Benadryl, Advil Cold and Sinus Other therapy:  none Hormone/birth control:  none  Caffeine:  1 cup of coffee most days, sometimes 1 cup at bedtime.  Pepsi on weekend Diet:  Drinks water all day.  Does not skip meals. Exercise:  not routine Depression:  yes; Anxiety:  yes Other pain:  chronic pain Sleep hygiene:  okay  HISTORY:  Onset:  childhood.  Resolved in 2017 but returned in 2022. Location:  left frontal region Quality:  aching, throbbing Intensity:  10/10.   Aura:  left facial numbness and tingling.  Brenda [name] stars in vision of left eye Prodrome:  absent Associated symptoms:  Nausea, vomiting, photophobia,  phonophobia.  She denies associated unilateral weakness. Duration:  all day Frequency:  3 times a month Frequency of abortive medication: takes Percocet daily for chronic pain Triggers:  stress, neck pain Relieving factors:  resting in dark with heating pad Activity:  movement worse.  Stays in dark   Prior Imaging (personally reviewed) 05/19/2018 CT HEAD:  No acute finding.  Normal intracranial imaging. 08/15/2015 MRI BRAIN WO:  No acute intracranial finding. Low level T2 signal of the white matter which can be seen in HIV infection.      Past NSAIDS/analgesics:  Demerol shot Past abortive triptans:  sumatriptan [place] Past abortive ergotamine:  none Past muscle relaxants:  baclofen Past anti-emetic:  none Past antihypertensive medications:  none Past antidepressant medications:  sertraline '100mg'$  Past anticonvulsant medications:  Trokendi XR '100mg'$  QD, Lyrica '100mg'$  TID Past anti-CGRP:  none Past vitamins/Herbal/Supplements:  none Past antihistamines/decongestants:  hydroxyzine Other past therapies:  none    Family history of headache:  maternal aunt  PAST MEDICAL HISTORY: Past Medical History:  Diagnosis Date   Abdominal pain    left side   Anxiety    Arthritis    knees   Chronic folliculitis    Chronic kidney disease    per pt/medication damaged her left kidney   Depression    Diabetes mellitus    type 2   Hemorrhoids    History of blood transfusion 2015   [facility]    HIV disease ([place])    Hypertension    Iron deficiency anemia due to chronic blood loss    Irregular menstrual cycle    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 for testing     baclofen (LIORESAL) 10 MG tablet Take 10 mg by mouth 3 (three) times daily as  needed.     Cholecalciferol 1.25 MG (50000 UT) capsule cholecalciferol (vitamin D3) 1,250 mcg (50,000 unit) capsule     diphenhydrAMINE (BENADRYL) 25 mg capsule Take 50 mg by mouth daily as needed for allergies.     Elviteg-Cobic-Emtricit-TenofAF (GENVOYA PO) Take by mouth daily.     elvitegravir-cobicistat-emtricitabine-tenofovir (GENVOYA) 150-150-200-10 MG TABS tablet TAKE 1 TABLET BY MOUTH DAILY WITH BREAKFAST. 30 tablet 0   empagliflozin (JARDIANCE) 25 MG TABS tablet Jardiance 25 mg tablet     fluconazole (DIFLUCAN) 100 MG tablet Take 100 mg by mouth daily.     hydrocortisone (ANUSOL-HC) 2.5 % rectal cream Place 1 application rectally 2 (two) times daily. For hemorrhoids (Patient taking differently: Place 1 application. rectally as needed. For hemorrhoids) 30 g 1   ibuprofen (ADVIL) 800 MG tablet Take 800 mg by mouth 2 (two) times daily as needed.     Insulin Glargine 300 UNIT/ML SOPN Inject 50 Units into the skin 2 (two) times daily. toujeo     losartan (COZAAR) 100 MG tablet TAKE 1 TABLET BY MOUTH ONCE DAILY THIS REPLACES LISINORIL TO PROTECT KIDNEY IN DIABETES ABOVE AND BEYOND BLOOD PRESSURE CONTROL     LYRICA 100 MG capsule Take 100 mg by mouth 3 (three) times daily.     metFORMIN (GLUCOPHAGE) 1000 MG tablet Take 1,000 mg by mouth daily.  0   ondansetron (ZOFRAN ODT) 4 MG disintegrating tablet Take 1 tablet (4 mg total) by mouth every 8 (eight) hours as needed for nausea or vomiting. 20 tablet 5   oxyCODONE-acetaminophen (PERCOCET) 10-325 MG tablet Take 1 tablet by mouth 4 (four) times daily as needed for pain.     OZEMPIC, 2 MG/DOSE, 8 MG/3ML SOPN Inject 2 mg into the skin once a week.     sertraline (ZOLOFT) 100 MG tablet Take 100 mg by mouth at bedtime.     traZODone (DESYREL) 100 MG tablet Take 100 mg by mouth at bedtime.     Current Facility-Administered Medications on File Prior to Visit  Medication Dose Route Frequency Provider Last Rate Last Admin   0.9 %  sodium chloride  infusion  500 mL Intravenous Once Gatha Mayer, MD        ALLERGIES: Allergies  Allergen Reactions   Cortisone Itching and Other (See Comments)    REACTION: "nerves"   Hydrocodone-Acetaminophen Itching and Other (See Comments)    REACTION: "nerves"   Morphine Itching and Other (See Comments)    REACTION: "nerves"    FAMILY HISTORY: Family History  Problem Relation Age of Onset   Diabetes Mother    Heart failure Mother    Heart failure Father    Diabetes Father    Heart failure Brother    Diabetes Brother    Diabetes Maternal Grandmother    Alzheimer's disease Paternal Grandmother    Colon cancer Neg Hx    Colon polyps Neg Hx    Celiac disease Neg Hx    Crohn's disease Neg Hx    Esophageal cancer Neg Hx    Rectal cancer Neg Hx    Stomach cancer Neg Hx  Objective:  *** General: No acute distress.  Patient appears well-groomed.   Head:  Normocephalic/atraumatic Eyes:  Fundi examined but not visualized Neck: supple, no paraspinal tenderness, full range of motion Heart:  Regular rate and rhythm Lungs:  Clear to auscultation bilaterally Back: No paraspinal tenderness Neurological Exam: alert and oriented to person, place, and time.  Speech fluent and not dysarthric, language intact.  CN II-XII intact. Bulk and tone normal, muscle strength 5/5 throughout.  Sensation to light touch intact.  Deep tendon reflexes 2+ throughout, toes downgoing.  Finger to nose testing intact.  Gait normal, Romberg negative.   Metta Clines, DO  CC: Amedeo Kinsman, MD

## 2021-09-29 ENCOUNTER — Ambulatory Visit: Payer: Medicare Other | Admitting: Neurology

## 2021-09-29 ENCOUNTER — Other Ambulatory Visit: Payer: Self-pay | Admitting: Obstetrics & Gynecology

## 2021-09-29 ENCOUNTER — Encounter: Payer: Self-pay | Admitting: Neurology

## 2021-09-29 DIAGNOSIS — Z029 Encounter for administrative examinations, unspecified: Secondary | ICD-10-CM

## 2021-10-08 ENCOUNTER — Other Ambulatory Visit: Payer: Self-pay | Admitting: Infectious Diseases

## 2021-10-08 DIAGNOSIS — B20 Human immunodeficiency virus [HIV] disease: Secondary | ICD-10-CM

## 2021-10-09 ENCOUNTER — Ambulatory Visit: Payer: Medicare Other | Admitting: Internal Medicine

## 2021-10-09 ENCOUNTER — Other Ambulatory Visit (HOSPITAL_COMMUNITY): Payer: Self-pay

## 2021-10-09 ENCOUNTER — Other Ambulatory Visit: Payer: Medicaid Other

## 2021-10-12 ENCOUNTER — Other Ambulatory Visit (HOSPITAL_COMMUNITY): Payer: Self-pay

## 2021-10-12 ENCOUNTER — Other Ambulatory Visit: Payer: Self-pay | Admitting: Infectious Diseases

## 2021-10-12 DIAGNOSIS — B20 Human immunodeficiency virus [HIV] disease: Secondary | ICD-10-CM

## 2021-10-23 ENCOUNTER — Encounter: Payer: Medicaid Other | Admitting: Infectious Diseases

## 2021-10-23 ENCOUNTER — Other Ambulatory Visit (HOSPITAL_COMMUNITY)
Admission: RE | Admit: 2021-10-23 | Discharge: 2021-10-23 | Disposition: A | Discharge status: Home / Self Care | Payer: Medicare Other | Source: Ambulatory Visit | Attending: Infectious Diseases | Admitting: Infectious Diseases

## 2021-10-23 ENCOUNTER — Other Ambulatory Visit (HOSPITAL_COMMUNITY): Payer: Self-pay

## 2021-10-23 ENCOUNTER — Other Ambulatory Visit: Payer: Self-pay

## 2021-10-23 ENCOUNTER — Other Ambulatory Visit: Payer: Medicare Other

## 2021-10-23 DIAGNOSIS — Z79899 Other long term (current) drug therapy: Secondary | ICD-10-CM

## 2021-10-23 DIAGNOSIS — B20 Human immunodeficiency virus [HIV] disease: Secondary | ICD-10-CM

## 2021-10-23 DIAGNOSIS — Z113 Encounter for screening for infections with a predominantly sexual mode of transmission: Secondary | ICD-10-CM

## 2021-10-23 MED ORDER — GENVOYA 150-150-200-10 MG PO TABS
1.0000 | ORAL_TABLET | Freq: Every day | ORAL | 0 refills | Status: DC
  Filled 2021-10-23: qty 30, 30d supply, fill #0

## 2021-10-26 LAB — URINE CYTOLOGY ANCILLARY ONLY
Chlamydia: NEGATIVE
Comment: NEGATIVE
Comment: NORMAL
Neisseria Gonorrhea: NEGATIVE

## 2021-10-27 LAB — COMPREHENSIVE METABOLIC PANEL
AG Ratio: 1.4 (calc) (ref 1.0–2.5)
ALT: 9 U/L (ref 6–29)
AST: 11 U/L (ref 10–35)
Albumin: 4.3 g/dL (ref 3.6–5.1)
Alkaline phosphatase (APISO): 95 U/L (ref 37–153)
BUN: 17 mg/dL (ref 7–25)
CO2: 28 mmol/L (ref 20–32)
Calcium: 9.3 mg/dL (ref 8.6–10.4)
Chloride: 107 mmol/L (ref 98–110)
Creat: 0.94 mg/dL (ref 0.50–1.03)
Globulin: 3 g/dL (calc) (ref 1.9–3.7)
Glucose, Bld: 216 mg/dL — ABNORMAL HIGH (ref 65–99)
Potassium: 4.2 mmol/L (ref 3.5–5.3)
Sodium: 144 mmol/L (ref 135–146)
Total Bilirubin: 0.2 mg/dL (ref 0.2–1.2)
Total Protein: 7.3 g/dL (ref 6.1–8.1)

## 2021-10-27 LAB — CBC
HCT: 35 % (ref 35.0–45.0)
Hemoglobin: 11.2 g/dL — ABNORMAL LOW (ref 11.7–15.5)
MCH: 25.7 pg — ABNORMAL LOW (ref 27.0–33.0)
MCHC: 32 g/dL (ref 32.0–36.0)
MCV: 80.3 fL (ref 80.0–100.0)
MPV: 10.2 fL (ref 7.5–12.5)
Platelets: 345 10*3/uL (ref 140–400)
RBC: 4.36 10*6/uL (ref 3.80–5.10)
RDW: 13.4 % (ref 11.0–15.0)
WBC: 4.7 10*3/uL (ref 3.8–10.8)

## 2021-10-27 LAB — HIV-1 RNA QUANT-NO REFLEX-BLD
HIV 1 RNA Quant: <20 copies/mL — AB
HIV-1 RNA Quant, Log: <1.3 Log copies/mL — AB

## 2021-10-27 LAB — RPR: RPR Ser Ql: NONREACTIVE

## 2021-10-27 LAB — T-HELPER CELLS (CD4) COUNT (NOT AT ARMC)
Absolute CD4: 756 cells/uL (ref 490–1740)
CD4 T Helper %: 40 % (ref 30–61)
Total lymphocyte count: 1875 cells/uL (ref 850–3900)

## 2021-10-27 LAB — LIPID PANEL
Cholesterol: 260 mg/dL — ABNORMAL HIGH (ref <200)
HDL: 45 mg/dL — ABNORMAL LOW (ref >=50)
LDL Cholesterol (Calc): 183 mg/dL (calc) — ABNORMAL HIGH
Non-HDL Cholesterol (Calc): 215 mg/dL (calc) — ABNORMAL HIGH (ref <130)
Total CHOL/HDL Ratio: 5.8 (calc) — ABNORMAL HIGH (ref <5.0)
Triglycerides: 171 mg/dL — ABNORMAL HIGH (ref <150)

## 2021-11-05 ENCOUNTER — Other Ambulatory Visit (HOSPITAL_COMMUNITY): Payer: Self-pay

## 2021-11-05 ENCOUNTER — Other Ambulatory Visit: Payer: Self-pay

## 2021-11-05 ENCOUNTER — Encounter: Payer: Self-pay | Admitting: Infectious Diseases

## 2021-11-05 ENCOUNTER — Ambulatory Visit (INDEPENDENT_AMBULATORY_CARE_PROVIDER_SITE_OTHER): Payer: Medicare Other | Admitting: Infectious Diseases

## 2021-11-05 ENCOUNTER — Encounter: Payer: Self-pay | Admitting: Hematology and Oncology

## 2021-11-05 VITALS — BP 127/84 | HR 84 | Temp 97.8°F | Wt 226.0 lb

## 2021-11-05 DIAGNOSIS — Z79899 Other long term (current) drug therapy: Secondary | ICD-10-CM | POA: Diagnosis not present

## 2021-11-05 DIAGNOSIS — E1142 Type 2 diabetes mellitus with diabetic polyneuropathy: Secondary | ICD-10-CM | POA: Diagnosis not present

## 2021-11-05 DIAGNOSIS — B20 Human immunodeficiency virus [HIV] disease: Secondary | ICD-10-CM

## 2021-11-05 DIAGNOSIS — Z113 Encounter for screening for infections with a predominantly sexual mode of transmission: Secondary | ICD-10-CM | POA: Diagnosis not present

## 2021-11-05 DIAGNOSIS — Z794 Long term (current) use of insulin: Secondary | ICD-10-CM

## 2021-11-05 DIAGNOSIS — E785 Hyperlipidemia, unspecified: Secondary | ICD-10-CM

## 2021-11-05 MED ORDER — ATORVASTATIN CALCIUM 10 MG PO TABS
10.0000 mg | ORAL_TABLET | Freq: Every day | ORAL | 3 refills | Status: AC
Start: 2021-11-05 — End: ?

## 2021-11-05 MED ORDER — GENVOYA 150-150-200-10 MG PO TABS
1.0000 | ORAL_TABLET | Freq: Every day | ORAL | 5 refills | Status: AC
  Filled 2021-11-05 – 2021-12-02 (×2): qty 30, 30d supply, fill #0
  Filled 2022-02-03: qty 30, 30d supply, fill #1
  Filled 2022-03-12: qty 30, 30d supply, fill #2
  Filled 2022-04-05: qty 30, 30d supply, fill #3

## 2021-11-05 NOTE — Assessment & Plan Note (Signed)
Her pap and mammo are uptodate She appears to be doing well Will get her DM f/u Will start her on statin.  U=u Return to clinic 9 months.

## 2021-11-05 NOTE — Progress Notes (Signed)
Subjective:    Patient ID: Brenda [name], female  DOB: [birth year], 51 y.o.        MRN: [id number]   HPI 51 yo F with hx of HIV+ (06-1996), DM2 (on insulin, dx year 2000, when she had her daughter). She has ongoing (GI and Heme) eval for anemia. She had colon 2-19 (hemerrhoids).  She has been on genvoya (?2003). Prev truvadaVevelyn [name].  She had renal u/s on 10-19-18 for abd pain. This showed developing medical-renal disease. She has had renal eval. Most recent Cr 0.93.  Has hx of CIN1 as well.  Her DM doctor left so she needs new doctor for this.    Has been having issues with sinuses, as weather changes.  Also had dental work done and she expects pain from this as well.   She has not been seen by surgery for her hemorrhoids. Have not been bothering her lately.   Mother is doing fine, she tries to not spend too much time with her, she has gotten married and this has helped.  She is spending less time with her father.   Her husband is positive, on ART. She got married 01-11-21.   CD4 756 HIV 1 RNA Quant  Date Value  10/23/2021 <20 DETECTED copies/mL (A)  12/09/2020 Not Detected Copies/mL  04/26/2019 25 copies/mL (H)   CD4 T Cell Abs (/uL)  Date Value  12/09/2020 681  04/26/2019 992  08/07/2018 470   PAP/Mammo done this year.  Colon- was unable to take prep.   Health Maintenance  Topic Date Due   FOOT EXAM  Never done   OPHTHALMOLOGY EXAM  Never done   TETANUS/TDAP  Never done   Zoster Vaccines- Shingrix (1 of 2) Never done   PAP SMEAR-Modifier  01/27/2015   MAMMOGRAM  03/22/2020   COVID-19 Vaccine (4 - Booster for Pfizer series) 12/31/2020   HEMOGLOBIN A1C  06/11/2021   INFLUENZA VACCINE  01/05/2022   COLONOSCOPY (Pts 45-59yr Insurance coverage will need to be confirmed)  09/16/2023   Hepatitis C Screening  Completed   HIV Screening  Completed   HPV VACCINES  Aged Out      Review of Systems  Constitutional:  Negative for chills, fever and weight loss.   Eyes:  Positive for blurred vision.  Respiratory:  Negative for cough.   Gastrointestinal:  Negative for constipation and diarrhea.  Genitourinary:  Negative for dysuria.  Neurological:  Negative for sensory change.  [facility]   Please see HPI. All other systems reviewed and negative.     Objective:  Physical Exam Vitals reviewed.  Constitutional:      Appearance: Normal appearance. She is obese.  HENT:     Mouth/Throat:     Mouth: Mucous membranes are moist.     Pharynx: No oropharyngeal exudate.  Eyes:     Extraocular Movements: Extraocular movements intact.     Pupils: Pupils are equal, round, and reactive to light.  Cardiovascular:     Rate and Rhythm: Normal rate and regular rhythm.  Pulmonary:     Effort: Pulmonary effort is normal.     Breath sounds: Normal breath sounds.  Abdominal:     General: Abdomen is flat. Bowel sounds are normal. There is no distension.     Tenderness: There is no abdominal tenderness.  Musculoskeletal:        General: Normal range of motion.  Cervical back: Normal range of motion and neck supple. No rigidity.     Right lower leg: No edema.     Left lower leg: No edema.     Right foot: No deformity.     Left foot: No deformity.  Feet:     Right foot:     Protective Sensation:  1 site tested.  1 site sensed.    Skin integrity: Skin integrity normal.     Toenail Condition: Right toenails are normal.     Left foot:     Protective Sensation:  1 site tested.  1 site sensed.    Skin integrity: Skin integrity normal.     Toenail Condition: Left toenails are normal.  Neurological:     Mental Status: She is alert.           Assessment & Plan:

## 2021-11-05 NOTE — Assessment & Plan Note (Addendum)
Will get her in with endo.  She is on ozempic.  Will start her on statin as well.

## 2021-11-05 NOTE — Assessment & Plan Note (Signed)
Will start her on lipitor.  See her back in 6 weeks.

## 2021-11-08 ENCOUNTER — Encounter: Payer: Self-pay | Admitting: Infectious Diseases

## 2021-11-11 ENCOUNTER — Telehealth: Payer: Self-pay

## 2021-11-11 ENCOUNTER — Other Ambulatory Visit (HOSPITAL_COMMUNITY): Payer: Self-pay

## 2021-11-11 NOTE — Telephone Encounter (Signed)
Patient called requesting provider name and office phone number for opthalmology referral recently placed by Dr. Johnnye Sima. Information provided from referrals tab. All questions answered.  Binnie Kand, RN

## 2021-11-12 ENCOUNTER — Other Ambulatory Visit (HOSPITAL_COMMUNITY): Payer: Self-pay

## 2021-11-12 ENCOUNTER — Other Ambulatory Visit: Payer: Self-pay

## 2021-11-12 ENCOUNTER — Encounter: Payer: Self-pay | Admitting: Internal Medicine

## 2021-11-12 ENCOUNTER — Encounter: Payer: Self-pay | Admitting: Dietician

## 2021-11-12 ENCOUNTER — Encounter: Payer: Self-pay | Admitting: Hematology and Oncology

## 2021-11-12 ENCOUNTER — Ambulatory Visit (INDEPENDENT_AMBULATORY_CARE_PROVIDER_SITE_OTHER): Payer: Medicare Other | Admitting: Internal Medicine

## 2021-11-12 ENCOUNTER — Ambulatory Visit (INDEPENDENT_AMBULATORY_CARE_PROVIDER_SITE_OTHER): Payer: Medicare Other | Admitting: Dietician

## 2021-11-12 VITALS — BP 126/80 | HR 75 | Temp 97.5°F | Ht 67.0 in | Wt 221.1 lb

## 2021-11-12 DIAGNOSIS — F32A Depression, unspecified: Secondary | ICD-10-CM

## 2021-11-12 DIAGNOSIS — Z794 Long term (current) use of insulin: Secondary | ICD-10-CM | POA: Diagnosis not present

## 2021-11-12 DIAGNOSIS — E1142 Type 2 diabetes mellitus with diabetic polyneuropathy: Secondary | ICD-10-CM | POA: Diagnosis not present

## 2021-11-12 LAB — POCT GLYCOSYLATED HEMOGLOBIN (HGB A1C): Hemoglobin A1C: 7.5 % — AB (ref 4.0–5.6)

## 2021-11-12 LAB — GLUCOSE, CAPILLARY: Glucose-Capillary: 157 mg/dL — ABNORMAL HIGH (ref 70–99)

## 2021-11-12 MED ORDER — METFORMIN HCL 1000 MG PO TABS
1000.0000 mg | ORAL_TABLET | Freq: Two times a day (BID) | ORAL | 2 refills | Status: AC
  Filled 2021-11-12: qty 60, 30d supply, fill #0

## 2021-11-12 NOTE — Patient Instructions (Addendum)
Thank you, Ms.Brenda [name] for allowing [place] to provide your care today. Today we discussed your diabetes.  1) I am increasing your metformin to 1000 mg twice daily. We will see you in 2-4 weeks for a follow-up. Please bring your meter and medications at that time.  Psychiatry: [facility]. Phone number: [phone]  I have ordered the following labs for you:   Lab Orders         Glucose, capillary         POC Hbg A1C       Referrals ordered today:   Referral Orders  No referral(s) requested today     I have ordered the following medication/changed the following medications:   Stop the following medications: Medications Discontinued During This Encounter  Medication Reason   metFORMIN (GLUCOPHAGE) 1000 MG tablet Reorder     Start the following medications: Meds ordered this encounter  Medications   metFORMIN (GLUCOPHAGE) 1000 MG tablet    Sig: Take 1 tablet (1,000 mg total) by mouth 2 (two) times daily.    Dispense:  60 tablet    Refill:  2     Follow up:  2-4 weeks    Should you have any questions or concerns please call the internal medicine clinic at [phone].

## 2021-11-12 NOTE — Progress Notes (Signed)
Internal Medicine Clinic Attending ° °Case discussed with Dr. Bonanno  °  At the time of the visit.  We reviewed the resident’s history and exam and pertinent patient test results.  I agree with the assessment, diagnosis, and plan of care documented in the resident’s note. ° °

## 2021-11-12 NOTE — Progress Notes (Signed)
   CC: establishment of care  HPI:  Ms.Brenda [name] is a 51 y.o. with past medical history as noted below who presents to the clinic today for the establishment of care. Please see problem-based list for further details, assessments, and plans.   Medical history: DM on ozempic, HIV On genvoya (followed by Dr. Johnnye Sima of [place]), LMP at age 68. Pt is UTD on pap smear.  Family history: Diabetes in mother, dad, brother, and grandmother. No cancers that run in the family.  Social history: Lives in the area. No tobacco or alcohol use. No illicit drug use.   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   Physical Exam:  Vitals:   11/12/21 0957  BP: 126/80  Pulse: 75  Temp: (!) 97.5 F (36.4 C)  TempSrc: Oral  SpO2: 97%  Weight: 221 lb 1.6 oz (100.3 kg)  Height: '5\' 7"'$  (1.702 m)   General: NAD, nl appearance HE: Normocephalic, atraumatic, EOMI, Conjunctivae normal ENT: No congestion, no rhinorrhea, no exudate or erythema  Cardiovascular: Normal rate, regular rhythm. No murmurs, rubs, or gallops Pulmonary: Effort normal, breath sounds normal. No wheezes, rales, or rhonchi Abdominal: soft, nontender, bowel sounds present Musculoskeletal: no swelling, deformity, injury or tenderness in extremities Skin: Warm, dry, no bruising, erythema, or rash Psychiatric/Behavioral:  normal mood, normal behavior     Assessment & Plan:   See Encounters Tab for problem based charting.  Patient discussed with Dr.  Cain Sieve

## 2021-11-12 NOTE — Assessment & Plan Note (Signed)
The patient is here to establish care. She reports that she is on metformin 1000 mg daily, jardiance 25 mg, and ozempic 2 mg weekly. Also reports that she was started on a new medication that "starts with an S." After asking more questions, she believes it is semglee but still unclear. States that she has a glucometer at home with lowest reading of 94. She did not bring this with her today.  A1c of 7.5 today.  Plan: Will increase metformin to 1000 mg BID today. Pt instructed to follow-up in 2-3 weeks with her meter and medications. Will try and coordinate this with [facility].

## 2021-11-12 NOTE — Progress Notes (Signed)
  Medical Nutrition Therapy :  Appt start time: 0935 end time:  0948. Total time: 13 minutes Visit # 1  Assessment:  Primary concerns today: Previous diabetes education: [facility] class "a long time ago". Her goal for our visit today is to get help in eating right for weight loss and blood sugar control (says she already exercises)  Visit cut short- asked her to reschedule.  Preferred Learning Style: No preference indicated  Learning Readiness: need to assess at future visit  ANTHROPOMETRICS: Estimated body mass index is 33.62 kg/m as calculated from the following:   Height as of 09/15/21: '5\' 8"'$  (1.727 m).   Weight as of this encounter: 221 lb 1.6 oz (100.3 kg).  WEIGHT HISTORY:  Wt Readings from Last 10 Encounters:  11/12/21 221 lb 1.6 oz (100.3 kg)  11/12/21 221 lb 1.6 oz (100.3 kg)  11/05/21 226 lb (102.5 kg)  09/15/21 225 lb (102.1 kg)  09/01/21 225 lb (102.1 kg)  05/27/21 223 lb (101.2 kg)  02/16/21 226 lb (102.5 kg)  01/15/21 221 lb (100.2 kg)  11/23/19 224 lb (101.6 kg)  10/24/18 225 lb (102.1 kg)    SLEEP:need to assess at future visit  MEDICATIONS: States her diabetes meds are: Metformin- once daily 1 PM thinks her dose is 500 mg; Jardiance - daily;  Ozempic 1x- once/week 2 mg;  A new insulin instead of toujeo thinks it may be semglee-55 once daily.  BLOOD SUGAR:Says she checks her blood sugar 1x/day but did not bring her meter. States it was 147 today or yesterday, dropped to 100 the other day and this caused a headache, she checked it, drank some Tea-medium sweet and went to sleep DIETARY INTAKE: Usual eating pattern includes need to assess at future visit  meals and ? snacks per day. Everyday foods include   Avoided foods include . currently no beef, no potatoes, eats rice and fruit  Food Intolerances: need to assess at future visit  Progress Towards Goal(s):  In progress.   Nutritional Diagnosis:  NB-1.1 Food and nutrition-related  knowledge deficit As related to lack of sufficient prior nutrition & diabetes meal planning training.  As evidenced by her report.    Intervention:  Nutrition and diabetes assessment, education about A1C, importance of self monitoring and meal planning Action Goal:schedule follow up  Outcome goal:  Coordination of care: worked with nursing and doctor to coordinate her care  Teaching Method Utilized: Visual, Auditory,Hands on Handouts given during visit include: After visit summary Barriers to learning/adherence to lifestyle change: competing values Demonstrated degree of understanding via:  Teach Back   Monitoring/Evaluation:  Dietary intake, exercise, meter, and body weight in 1 week(s). Butch Penny Graciella Arment, RD 11/12/2021 10:20 AM.

## 2021-11-12 NOTE — Patient Instructions (Signed)
Hi Brenda [name],  Nice to meet you today.  Please schedule a follow up visit with me .  Please bring your meter and it would be helpful if you could write down everything you eat and drink in 1 day along with what time you ate it and how much.   Writing down what we eat and drink can help [place] to see it in a different way. It may help [place] to change habits. What we eat and drink are habits formed during our lifetime. Habits are learned and can be unlearned and changed.   Please write down the foods and beverages you have during the day. Please include at least the time you eat and drink, what you eat and drink and how much you eat and drink. Includes as much detail as you can.  For example:  Time   What    How much 8 AM  Cooked oatmeal   1 Cup   Coffee     1 mug                         Sugar in oatmeal   1 teaspoon         Sugar in coffee   2 teaspoons   Milk in oatmeal   1/4 cup   Creamer in coffee    2 teaspoons   Margarine in oatmeal   1 teaspoon    Feel free to call me anytime with question or concerns  Butch Penny [phone]

## 2021-11-12 NOTE — Assessment & Plan Note (Signed)
PHQ-9 score of 9 today. Pt states that she was seeing a psychiatrist at [facility] but that she wants to switch to somewhere else.  -Provided information for [place] today

## 2021-12-02 ENCOUNTER — Other Ambulatory Visit (HOSPITAL_COMMUNITY): Payer: Self-pay

## 2021-12-02 ENCOUNTER — Encounter: Payer: Medicare Other | Admitting: Student

## 2021-12-02 ENCOUNTER — Encounter: Payer: Medicare Other | Admitting: Dietician

## 2021-12-09 ENCOUNTER — Other Ambulatory Visit (HOSPITAL_COMMUNITY): Payer: Self-pay

## 2021-12-09 ENCOUNTER — Encounter: Payer: Self-pay | Admitting: Hematology and Oncology

## 2021-12-29 ENCOUNTER — Other Ambulatory Visit (HOSPITAL_COMMUNITY): Payer: Self-pay

## 2021-12-31 ENCOUNTER — Telehealth: Payer: Self-pay | Admitting: Licensed Clinical Social Worker

## 2021-12-31 NOTE — Patient Outreach (Signed)
  Medicaid Managed Care Social Work Note  12/31/2021 Name:  Brenda [name] MRN:  [id number] DOB:  1970/08/19  Brenda [name] is an 51 y.o. year old female who is a primary patient of Royce Macadamia, Mike Gip., MD.  The [facility] Managed Care Coordination team was consulted for assistance with:  Disease Management and care coordination needs  Brenda [name] was given information about Medicaid Managed Care Coordination team services today. [place] Patient agreed to services and verbal consent obtained. Patient reports that she would like to gain [facility] nursing and pharmacy assistance.   Plan: The Managed Medicaid care management team will reach out to the patient again over the next 30 days.  Eula Fried, BSW, MSW, [employer] Managed Medicaid LCSW [email or web address] Phone: [phone]

## 2022-01-01 ENCOUNTER — Other Ambulatory Visit (HOSPITAL_COMMUNITY): Payer: Self-pay

## 2022-01-11 ENCOUNTER — Other Ambulatory Visit (HOSPITAL_COMMUNITY): Payer: Self-pay

## 2022-01-12 ENCOUNTER — Other Ambulatory Visit (HOSPITAL_COMMUNITY): Payer: Self-pay

## 2022-01-21 ENCOUNTER — Other Ambulatory Visit: Payer: Self-pay | Admitting: *Deleted

## 2022-01-21 NOTE — Patient Outreach (Signed)
Care Coordination  01/21/2022  Tashala Cumbo 03/28/1971 [id number]  Successful outreach with Ms. Gunner. Ms. Sedlak was referred to MM Team for CM services. Upon chart review, it was noted that Ms. Ilyas no longer has MM. RNCM verified insurance coverage with Ms. Kahrs. She reports having [employer] Medicare/dual coverage since March 2023. RNCM explained to patient that she should have a Case Manager with [facility] Medicare and to contact Member Services to be connected to her CM. Patient voiced understanding and expressed appreciation to this RNCM.  Lurena Joiner RN, BSN [place]  [employer] manager

## 2022-02-01 ENCOUNTER — Other Ambulatory Visit: Payer: Self-pay | Admitting: Obstetrics and Gynecology

## 2022-02-01 NOTE — Patient Outreach (Cosign Needed)
Care Coordination  02/01/2022  Precilla Purnell [birth year] [id number]  RNCM providing case closure documentation.  Patient no longer has Managed Medicaid.  Patient knows to follow up with provider for any case management needs and/or current insurance provider.  Aida Raider RN, BSN [place]  [employer] - Managed Medicaid High Risk [phone].

## 2022-02-03 ENCOUNTER — Other Ambulatory Visit (HOSPITAL_COMMUNITY): Payer: Self-pay

## 2022-02-09 ENCOUNTER — Other Ambulatory Visit (HOSPITAL_COMMUNITY): Payer: Self-pay

## 2022-03-05 ENCOUNTER — Other Ambulatory Visit (HOSPITAL_COMMUNITY): Payer: Self-pay

## 2022-03-12 ENCOUNTER — Other Ambulatory Visit (HOSPITAL_COMMUNITY): Payer: Self-pay

## 2022-04-01 ENCOUNTER — Other Ambulatory Visit (HOSPITAL_COMMUNITY): Payer: Self-pay

## 2022-04-05 ENCOUNTER — Other Ambulatory Visit (HOSPITAL_COMMUNITY): Payer: Self-pay

## 2022-04-08 ENCOUNTER — Other Ambulatory Visit (HOSPITAL_COMMUNITY): Payer: Self-pay

## 2022-04-30 ENCOUNTER — Other Ambulatory Visit (HOSPITAL_COMMUNITY): Payer: Self-pay

## 2022-05-04 ENCOUNTER — Other Ambulatory Visit (HOSPITAL_COMMUNITY): Payer: Self-pay

## 2022-05-06 ENCOUNTER — Other Ambulatory Visit (HOSPITAL_COMMUNITY): Payer: Self-pay

## 2022-05-21 ENCOUNTER — Emergency Department (HOSPITAL_COMMUNITY): Payer: Medicare Other

## 2022-05-21 ENCOUNTER — Emergency Department (HOSPITAL_COMMUNITY)
Admission: EM | Admit: 2022-05-21 | Discharge: 2022-05-21 | Disposition: A | Discharge status: Home / Self Care | Payer: Medicare Other | Attending: Emergency Medicine | Admitting: Emergency Medicine

## 2022-05-21 ENCOUNTER — Encounter: Payer: Self-pay | Admitting: Hematology and Oncology

## 2022-05-21 ENCOUNTER — Other Ambulatory Visit: Payer: Self-pay

## 2022-05-21 ENCOUNTER — Encounter (HOSPITAL_COMMUNITY): Payer: Self-pay

## 2022-05-21 DIAGNOSIS — E669 Obesity, unspecified: Secondary | ICD-10-CM | POA: Diagnosis not present

## 2022-05-21 DIAGNOSIS — B20 Human immunodeficiency virus [HIV] disease: Secondary | ICD-10-CM | POA: Insufficient documentation

## 2022-05-21 DIAGNOSIS — Z7984 Long term (current) use of oral hypoglycemic drugs: Secondary | ICD-10-CM | POA: Diagnosis not present

## 2022-05-21 DIAGNOSIS — R519 Headache, unspecified: Secondary | ICD-10-CM | POA: Insufficient documentation

## 2022-05-21 DIAGNOSIS — R109 Unspecified abdominal pain: Secondary | ICD-10-CM | POA: Diagnosis not present

## 2022-05-21 DIAGNOSIS — E1122 Type 2 diabetes mellitus with diabetic chronic kidney disease: Secondary | ICD-10-CM | POA: Insufficient documentation

## 2022-05-21 DIAGNOSIS — N189 Chronic kidney disease, unspecified: Secondary | ICD-10-CM | POA: Insufficient documentation

## 2022-05-21 DIAGNOSIS — I129 Hypertensive chronic kidney disease with stage 1 through stage 4 chronic kidney disease, or unspecified chronic kidney disease: Secondary | ICD-10-CM | POA: Diagnosis not present

## 2022-05-21 DIAGNOSIS — Z79899 Other long term (current) drug therapy: Secondary | ICD-10-CM | POA: Insufficient documentation

## 2022-05-21 DIAGNOSIS — Z794 Long term (current) use of insulin: Secondary | ICD-10-CM | POA: Diagnosis not present

## 2022-05-21 DIAGNOSIS — M542 Cervicalgia: Secondary | ICD-10-CM | POA: Diagnosis present

## 2022-05-21 LAB — COMPREHENSIVE METABOLIC PANEL
ALT: 14 U/L (ref 0–44)
AST: 20 U/L (ref 15–41)
Albumin: 3.7 g/dL (ref 3.5–5.0)
Alkaline Phosphatase: 83 U/L (ref 38–126)
Anion gap: 7 (ref 5–15)
BUN: 11 mg/dL (ref 6–20)
CO2: 27 mmol/L (ref 22–32)
Calcium: 8.8 mg/dL — ABNORMAL LOW (ref 8.9–10.3)
Chloride: 108 mmol/L (ref 98–111)
Creatinine, Ser: 0.89 mg/dL (ref 0.44–1.00)
GFR, Estimated: >60 mL/min (ref >60)
Glucose, Bld: 167 mg/dL — ABNORMAL HIGH (ref 70–99)
Potassium: 3.7 mmol/L (ref 3.5–5.1)
Sodium: 142 mmol/L (ref 135–145)
Total Bilirubin: 0.3 mg/dL (ref 0.3–1.2)
Total Protein: 7 g/dL (ref 6.5–8.1)

## 2022-05-21 LAB — I-STAT CHEM 8, ED
BUN: 14 mg/dL (ref 6–20)
Calcium, Ion: 1.17 mmol/L (ref 1.15–1.40)
Chloride: 105 mmol/L (ref 98–111)
Creatinine, Ser: 0.8 mg/dL (ref 0.44–1.00)
Glucose, Bld: 167 mg/dL — ABNORMAL HIGH (ref 70–99)
HCT: 35 % — ABNORMAL LOW (ref 36.0–46.0)
Hemoglobin: 11.9 g/dL — ABNORMAL LOW (ref 12.0–15.0)
Potassium: 3.7 mmol/L (ref 3.5–5.1)
Sodium: 144 mmol/L (ref 135–145)
TCO2: 26 mmol/L (ref 22–32)

## 2022-05-21 LAB — CBC WITH DIFFERENTIAL/PLATELET
Abs Immature Granulocytes: 0.01 10*3/uL (ref 0.00–0.07)
Basophils Absolute: 0 10*3/uL (ref 0.0–0.1)
Basophils Relative: 1 %
Eosinophils Absolute: 0.2 10*3/uL (ref 0.0–0.5)
Eosinophils Relative: 6 %
HCT: 35.5 % — ABNORMAL LOW (ref 36.0–46.0)
Hemoglobin: 11.1 g/dL — ABNORMAL LOW (ref 12.0–15.0)
Immature Granulocytes: 0 %
Lymphocytes Relative: 34 %
Lymphs Abs: 1.3 10*3/uL (ref 0.7–4.0)
MCH: 25 pg — ABNORMAL LOW (ref 26.0–34.0)
MCHC: 31.3 g/dL (ref 30.0–36.0)
MCV: 80 fL (ref 80.0–100.0)
Monocytes Absolute: 0.3 10*3/uL (ref 0.1–1.0)
Monocytes Relative: 7 %
Neutro Abs: 2 10*3/uL (ref 1.7–7.7)
Neutrophils Relative %: 52 %
Platelets: 319 10*3/uL (ref 150–400)
RBC: 4.44 MIL/uL (ref 3.87–5.11)
RDW: 13.9 % (ref 11.5–15.5)
WBC: 3.8 10*3/uL — ABNORMAL LOW (ref 4.0–10.5)
nRBC: 0 % (ref 0.0–0.2)

## 2022-05-21 LAB — TROPONIN I (HIGH SENSITIVITY)
Troponin I (High Sensitivity): 3 ng/L (ref <18)
Troponin I (High Sensitivity): 3 ng/L (ref <18)

## 2022-05-21 MED ORDER — IOHEXOL 350 MG/ML SOLN
100.0000 mL | Freq: Once | INTRAVENOUS | Status: AC | PRN
  Administered 2022-05-21: 100 mL via INTRAVENOUS

## 2022-05-21 MED ORDER — ACETAMINOPHEN 500 MG PO TABS
1000.0000 mg | ORAL_TABLET | Freq: Once | ORAL | Status: AC
  Administered 2022-05-21: 1000 mg via ORAL
  Filled 2022-05-21: qty 2

## 2022-05-21 MED ORDER — PROMETHAZINE HCL 25 MG PO TABS
12.5000 mg | ORAL_TABLET | Freq: Once | ORAL | Status: AC
  Administered 2022-05-21: 12.5 mg via ORAL
  Filled 2022-05-21: qty 1

## 2022-05-21 MED ORDER — KETOROLAC TROMETHAMINE 15 MG/ML IJ SOLN
15.0000 mg | Freq: Once | INTRAMUSCULAR | Status: AC
  Administered 2022-05-21: 15 mg via INTRAVENOUS
  Filled 2022-05-21: qty 1

## 2022-05-21 NOTE — ED Notes (Signed)
Pt requesting to stand and walk to restroom. Discovered pt from getting up before scans. Pt said she would try to hold it as long as she could. PA and RN aware.
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
## 2022-06-15 ENCOUNTER — Other Ambulatory Visit (HOSPITAL_COMMUNITY): Payer: Self-pay

## 2022-08-23 ENCOUNTER — Encounter: Payer: Self-pay | Admitting: Hematology and Oncology

## 2022-08-25 ENCOUNTER — Encounter (HOSPITAL_COMMUNITY): Payer: Self-pay | Admitting: Emergency Medicine

## 2022-08-25 ENCOUNTER — Observation Stay (HOSPITAL_COMMUNITY)
Admission: EM | Admit: 2022-08-25 | Discharge: 2022-08-26 | Disposition: A | Discharge status: Home / Self Care | Payer: BLUE CROSS/BLUE SHIELD | Attending: Internal Medicine | Admitting: Internal Medicine

## 2022-08-25 ENCOUNTER — Emergency Department (HOSPITAL_COMMUNITY): Payer: BLUE CROSS/BLUE SHIELD

## 2022-08-25 ENCOUNTER — Other Ambulatory Visit: Payer: Self-pay

## 2022-08-25 DIAGNOSIS — R197 Diarrhea, unspecified: Secondary | ICD-10-CM | POA: Diagnosis present

## 2022-08-25 DIAGNOSIS — Z794 Long term (current) use of insulin: Secondary | ICD-10-CM | POA: Diagnosis not present

## 2022-08-25 DIAGNOSIS — E1165 Type 2 diabetes mellitus with hyperglycemia: Secondary | ICD-10-CM | POA: Diagnosis not present

## 2022-08-25 DIAGNOSIS — R1084 Generalized abdominal pain: Secondary | ICD-10-CM | POA: Insufficient documentation

## 2022-08-25 DIAGNOSIS — Z79899 Other long term (current) drug therapy: Secondary | ICD-10-CM | POA: Insufficient documentation

## 2022-08-25 DIAGNOSIS — I129 Hypertensive chronic kidney disease with stage 1 through stage 4 chronic kidney disease, or unspecified chronic kidney disease: Secondary | ICD-10-CM | POA: Diagnosis not present

## 2022-08-25 DIAGNOSIS — R112 Nausea with vomiting, unspecified: Secondary | ICD-10-CM | POA: Diagnosis present

## 2022-08-25 DIAGNOSIS — N189 Chronic kidney disease, unspecified: Secondary | ICD-10-CM | POA: Insufficient documentation

## 2022-08-25 DIAGNOSIS — Z7984 Long term (current) use of oral hypoglycemic drugs: Secondary | ICD-10-CM | POA: Diagnosis not present

## 2022-08-25 DIAGNOSIS — Z1152 Encounter for screening for COVID-19: Secondary | ICD-10-CM | POA: Insufficient documentation

## 2022-08-25 DIAGNOSIS — E1142 Type 2 diabetes mellitus with diabetic polyneuropathy: Secondary | ICD-10-CM

## 2022-08-25 DIAGNOSIS — B20 Human immunodeficiency virus [HIV] disease: Secondary | ICD-10-CM | POA: Diagnosis present

## 2022-08-25 DIAGNOSIS — K2970 Gastritis, unspecified, without bleeding: Secondary | ICD-10-CM | POA: Diagnosis present

## 2022-08-25 DIAGNOSIS — K529 Noninfective gastroenteritis and colitis, unspecified: Principal | ICD-10-CM | POA: Insufficient documentation

## 2022-08-25 DIAGNOSIS — Z21 Asymptomatic human immunodeficiency virus [HIV] infection status: Secondary | ICD-10-CM | POA: Insufficient documentation

## 2022-08-25 DIAGNOSIS — E1122 Type 2 diabetes mellitus with diabetic chronic kidney disease: Secondary | ICD-10-CM | POA: Diagnosis not present

## 2022-08-25 DIAGNOSIS — E1140 Type 2 diabetes mellitus with diabetic neuropathy, unspecified: Secondary | ICD-10-CM | POA: Insufficient documentation

## 2022-08-25 DIAGNOSIS — E119 Type 2 diabetes mellitus without complications: Secondary | ICD-10-CM

## 2022-08-25 LAB — COMPREHENSIVE METABOLIC PANEL
ALT: 20 U/L (ref 0–44)
AST: 29 U/L (ref 15–41)
Albumin: 3.8 g/dL (ref 3.5–5.0)
Alkaline Phosphatase: 87 U/L (ref 38–126)
Anion gap: 12 (ref 5–15)
BUN: 11 mg/dL (ref 6–20)
CO2: 21 mmol/L — ABNORMAL LOW (ref 22–32)
Calcium: 9 mg/dL (ref 8.9–10.3)
Chloride: 107 mmol/L (ref 98–111)
Creatinine, Ser: 1 mg/dL (ref 0.44–1.00)
GFR, Estimated: >60 mL/min (ref >60)
Glucose, Bld: 216 mg/dL — ABNORMAL HIGH (ref 70–99)
Potassium: 3.9 mmol/L (ref 3.5–5.1)
Sodium: 140 mmol/L (ref 135–145)
Total Bilirubin: 0.3 mg/dL (ref 0.3–1.2)
Total Protein: 7.7 g/dL (ref 6.5–8.1)

## 2022-08-25 LAB — URINALYSIS, ROUTINE W REFLEX MICROSCOPIC
Bacteria, UA: NONE SEEN
Bilirubin Urine: NEGATIVE
Glucose, UA: >=500 mg/dL — AB
Hgb urine dipstick: NEGATIVE
Ketones, ur: NEGATIVE mg/dL
Leukocytes,Ua: NEGATIVE
Nitrite: NEGATIVE
Protein, ur: NEGATIVE mg/dL
Specific Gravity, Urine: >1.046 — ABNORMAL HIGH (ref 1.005–1.030)
pH: 5 (ref 5.0–8.0)

## 2022-08-25 LAB — CBC WITH DIFFERENTIAL/PLATELET
Abs Immature Granulocytes: 0.05 10*3/uL (ref 0.00–0.07)
Basophils Absolute: 0 10*3/uL (ref 0.0–0.1)
Basophils Relative: 0 %
Eosinophils Absolute: 0.1 10*3/uL (ref 0.0–0.5)
Eosinophils Relative: 1 %
HCT: 40.6 % (ref 36.0–46.0)
Hemoglobin: 12.1 g/dL (ref 12.0–15.0)
Immature Granulocytes: 1 %
Lymphocytes Relative: 7 %
Lymphs Abs: 0.6 10*3/uL — ABNORMAL LOW (ref 0.7–4.0)
MCH: 24.4 pg — ABNORMAL LOW (ref 26.0–34.0)
MCHC: 29.8 g/dL — ABNORMAL LOW (ref 30.0–36.0)
MCV: 81.9 fL (ref 80.0–100.0)
Monocytes Absolute: 0.3 10*3/uL (ref 0.1–1.0)
Monocytes Relative: 3 %
Neutro Abs: 7.6 10*3/uL (ref 1.7–7.7)
Neutrophils Relative %: 88 %
Platelets: 370 10*3/uL (ref 150–400)
RBC: 4.96 MIL/uL (ref 3.87–5.11)
RDW: 14.1 % (ref 11.5–15.5)
WBC: 8.6 10*3/uL (ref 4.0–10.5)
nRBC: 0 % (ref 0.0–0.2)

## 2022-08-25 LAB — GLUCOSE, CAPILLARY
Glucose-Capillary: 112 mg/dL — ABNORMAL HIGH (ref 70–99)
Glucose-Capillary: 122 mg/dL — ABNORMAL HIGH (ref 70–99)

## 2022-08-25 LAB — TROPONIN I (HIGH SENSITIVITY): Troponin I (High Sensitivity): 5 ng/L (ref <18)

## 2022-08-25 LAB — RESP PANEL BY RT-PCR (RSV, FLU A&B, COVID)  RVPGX2
Influenza A by PCR: NEGATIVE
Influenza B by PCR: NEGATIVE
Resp Syncytial Virus by PCR: NEGATIVE
SARS Coronavirus 2 by RT PCR: NEGATIVE

## 2022-08-25 LAB — I-STAT BETA HCG BLOOD, ED (MC, WL, AP ONLY): I-stat hCG, quantitative: <5 m[IU]/mL (ref <5)

## 2022-08-25 LAB — LIPASE, BLOOD: Lipase: 72 U/L — ABNORMAL HIGH (ref 11–51)

## 2022-08-25 MED ORDER — DIPHENHYDRAMINE HCL 25 MG PO CAPS: 50.0000 mg | ORAL_CAPSULE | Freq: Every day | ORAL | Status: DC | PRN

## 2022-08-25 MED ORDER — ONDANSETRON HCL 4 MG/2ML IJ SOLN
4.0000 mg | Freq: Four times a day (QID) | INTRAMUSCULAR | Status: DC | PRN
  Administered 2022-08-25: 4 mg via INTRAVENOUS
  Filled 2022-08-25: qty 2

## 2022-08-25 MED ORDER — METOCLOPRAMIDE HCL 5 MG/ML IJ SOLN
10.0000 mg | Freq: Once | INTRAMUSCULAR | Status: AC
  Administered 2022-08-25: 10 mg via INTRAVENOUS
  Filled 2022-08-25: qty 2

## 2022-08-25 MED ORDER — HYDROCORTISONE (PERIANAL) 2.5 % EX CREA
TOPICAL_CREAM | Freq: Four times a day (QID) | CUTANEOUS | Status: DC
  Filled 2022-08-25: qty 28.35

## 2022-08-25 MED ORDER — BACLOFEN 10 MG PO TABS
10.0000 mg | ORAL_TABLET | Freq: Three times a day (TID) | ORAL | Status: DC | PRN
  Administered 2022-08-25 – 2022-08-26 (×2): 10 mg via ORAL
  Filled 2022-08-25 (×2): qty 1

## 2022-08-25 MED ORDER — INSULIN ASPART 100 UNIT/ML IJ SOLN: 0.0000 [IU] | Freq: Three times a day (TID) | INTRAMUSCULAR | Status: DC

## 2022-08-25 MED ORDER — INSULIN GLARGINE-YFGN 100 UNIT/ML ~~LOC~~ SOLN
50.0000 [IU] | Freq: Every day | SUBCUTANEOUS | Status: DC
  Administered 2022-08-25: 50 [IU] via SUBCUTANEOUS
  Filled 2022-08-25 (×2): qty 0.5

## 2022-08-25 MED ORDER — ENOXAPARIN SODIUM 40 MG/0.4ML IJ SOSY
40.0000 mg | PREFILLED_SYRINGE | INTRAMUSCULAR | Status: DC
  Administered 2022-08-25: 40 mg via SUBCUTANEOUS

## 2022-08-25 MED ORDER — FENTANYL CITRATE PF 50 MCG/ML IJ SOSY
50.0000 ug | PREFILLED_SYRINGE | Freq: Once | INTRAMUSCULAR | Status: AC
  Administered 2022-08-25: 50 ug via INTRAVENOUS
  Filled 2022-08-25: qty 1

## 2022-08-25 MED ORDER — ONDANSETRON HCL 4 MG/2ML IJ SOLN
4.0000 mg | Freq: Once | INTRAMUSCULAR | Status: AC
  Administered 2022-08-25: 4 mg via INTRAVENOUS
  Filled 2022-08-25: qty 2

## 2022-08-25 MED ORDER — PREGABALIN 100 MG PO CAPS
100.0000 mg | ORAL_CAPSULE | Freq: Three times a day (TID) | ORAL | Status: DC
  Administered 2022-08-25 – 2022-08-26 (×3): 100 mg via ORAL
  Filled 2022-08-25 (×2): qty 1

## 2022-08-25 MED ORDER — LACTATED RINGERS IV BOLUS
1000.0000 mL | Freq: Once | INTRAVENOUS | Status: AC
  Administered 2022-08-25: 1000 mL via INTRAVENOUS

## 2022-08-25 MED ORDER — PHENOL 1.4 % MT LIQD: 1.0000 | OROMUCOSAL | Status: DC | PRN

## 2022-08-25 MED ORDER — IBUPROFEN 400 MG PO TABS
800.0000 mg | ORAL_TABLET | Freq: Two times a day (BID) | ORAL | Status: DC | PRN
  Administered 2022-08-25: 800 mg via ORAL
  Filled 2022-08-25: qty 1

## 2022-08-25 MED ORDER — SERTRALINE HCL 100 MG PO TABS
100.0000 mg | ORAL_TABLET | Freq: Every day | ORAL | Status: DC
  Administered 2022-08-25: 100 mg via ORAL
  Filled 2022-08-25: qty 1

## 2022-08-25 MED ORDER — ATORVASTATIN CALCIUM 10 MG PO TABS
10.0000 mg | ORAL_TABLET | Freq: Every day | ORAL | Status: DC
  Administered 2022-08-26: 10 mg via ORAL
  Filled 2022-08-25: qty 1

## 2022-08-25 MED ORDER — HYDRALAZINE HCL 25 MG PO TABS: 25.0000 mg | ORAL_TABLET | Freq: Four times a day (QID) | ORAL | Status: DC | PRN

## 2022-08-25 MED ORDER — OXYCODONE-ACETAMINOPHEN 5-325 MG PO TABS
1.0000 | ORAL_TABLET | Freq: Four times a day (QID) | ORAL | Status: DC | PRN
  Administered 2022-08-25 – 2022-08-26 (×2): 1 via ORAL
  Filled 2022-08-25 (×2): qty 1

## 2022-08-25 MED ORDER — OXYCODONE-ACETAMINOPHEN 10-325 MG PO TABS: 1.0000 | ORAL_TABLET | Freq: Four times a day (QID) | ORAL | Status: DC | PRN

## 2022-08-25 MED ORDER — IOHEXOL 350 MG/ML SOLN
75.0000 mL | Freq: Once | INTRAVENOUS | Status: AC | PRN
  Administered 2022-08-25: 75 mL via INTRAVENOUS

## 2022-08-25 MED ORDER — SODIUM CHLORIDE 0.9 % IV SOLN: INTRAVENOUS | Status: AC

## 2022-08-25 MED ORDER — SODIUM CHLORIDE 0.9 % IV BOLUS
1000.0000 mL | Freq: Once | INTRAVENOUS | Status: AC
  Administered 2022-08-25: 1000 mL via INTRAVENOUS

## 2022-08-25 MED ORDER — OXYCODONE HCL 5 MG PO TABS
5.0000 mg | ORAL_TABLET | Freq: Four times a day (QID) | ORAL | Status: DC | PRN
  Administered 2022-08-25: 5 mg via ORAL
  Filled 2022-08-25: qty 1

## 2022-08-25 MED ORDER — SODIUM CHLORIDE 0.9 % IV SOLN: INTRAVENOUS | Status: DC

## 2022-08-25 MED ORDER — ELVITEG-COBIC-EMTRICIT-TENOFAF 150-150-200-10 MG PO TABS
1.0000 | ORAL_TABLET | Freq: Every day | ORAL | Status: DC
  Administered 2022-08-26: 1 via ORAL
  Filled 2022-08-25 (×2): qty 1

## 2022-08-25 MED ORDER — INSULIN ASPART 100 UNIT/ML IJ SOLN: 0.0000 [IU] | Freq: Every day | INTRAMUSCULAR | Status: DC

## 2022-08-25 MED ORDER — TRAZODONE HCL 50 MG PO TABS
100.0000 mg | ORAL_TABLET | Freq: Every day | ORAL | Status: DC
  Administered 2022-08-25: 100 mg via ORAL
  Filled 2022-08-25: qty 2

## 2022-08-25 NOTE — ED Notes (Signed)
Pt stated her kidney's are hurting. RN reassured pt CT scan stated kidneys unremarkable. RN provided pt with pain medication in MAR.

## 2022-08-25 NOTE — ED Notes (Signed)
RN supplied pt with urine cup and pt ambulated to restroom without incident

## 2022-08-25 NOTE — ED Notes (Signed)
Pt states her butt hurts. RN recommended pt to reposition in bed.

## 2022-08-25 NOTE — ED Notes (Signed)
ED TO INPATIENT HANDOFF REPORT  ED Nurse Name and Phone #: Juel Burrow [phone]  S Name/Age/Gender Brenda [name] 52 y.o. female Room/Bed: 019C/019C  Code Status   Code Status: Full Code  Home/SNF/Other Home Patient oriented to: self, place, time, and situation Is this baseline? Yes   Triage Complete: Triage complete  Chief Complaint Gastroenteritis [K52.9]  Triage Note Pt here from home with c/o n/v/d times 4 days , ems gave 4mg  of zofran and 300 of fluids prior to arrival    Allergies Allergies  Allergen Reactions   Cortisone Itching and Other (See Comments)    REACTION: "nerves"   Hydrocodone-Acetaminophen Itching and Other (See Comments)    REACTION: "nerves"   Morphine Itching and Other (See Comments)    REACTION: "nerves"    Level of Care/Admitting Diagnosis ED Disposition     ED Disposition  Admit   Condition  --   [place]: [place] [[id number]]  Level of Care: Med-Surg [16]  May place patient in observation at [facility] or [facility] if equivalent level of care is available:: No  Covid Evaluation: Asymptomatic - no recent exposure (last 10 days) testing not required  Diagnosis: Gastroenteritis [id number]  Admitting Physician: Lequita Halt [id number]  Attending Physician: Lequita Halt [id number]          B Medical/Surgery History Past Medical History:  Diagnosis Date   Abdominal pain    left side   Anxiety    Arthritis    knees   Chronic folliculitis    Chronic kidney disease    per pt/medication damaged her left kidney   Depression    Diabetes mellitus    type 2   Hemorrhoids    History of blood transfusion 2015   [facility]    HIV disease ([place])    Hypertension    Iron deficiency anemia due to chronic blood loss    Irregular menstrual cycle    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 Raphael Gibney 2011   HYSTEROSCOPY WITH D & C N/A 04/12/2018   Procedure: DILATATION AND CURETTAGE /HYSTEROSCOPY;  Surgeon: Ena Dawley, MD;  Location: [facility];  Service: Gynecology;  Laterality: N/A;   LAPAROSCOPIC LYSIS OF ADHESIONS  01/10/2014   Procedure: LAPAROSCOPIC LYSIS OF ADHESIONS;  Surgeon: Ena Dawley, MD;  Location: [facility];  Service: Gynecology;;   LAPAROSCOPY N/A 01/10/2014   Procedure: LAPAROSCOPY OPERATIVE;  Surgeon: Ena Dawley, MD;  Location: [facility];  Service: Gynecology;  Laterality: N/A;   TUBAL LIGATION     UMBILICAL HERNIA REPAIR  01/10/2014   Procedure: HERNIA REPAIR UMBILICAL ADULT;  Surgeon: Ena Dawley, MD;  Location: [facility];  Service: Gynecology;;   WISDOM TOOTH EXTRACTION       A IV Location/Drains/Wounds Patient Lines/Drains/Airways Status     Active Line/Drains/Airways     Name Placement date Placement time Site Days   Peripheral IV 08/25/22 18 G Anterior;Left;Proximal Forearm 08/25/22  0751  Forearm  less than 1   Incision (Closed) 04/12/18 Vagina Other (Comment) 04/12/18  0945  --  1596            Intake/Output Last 24 hours  Intake/Output Summary (Last 24 hours) at 08/25/2022 1340 Last data filed at 08/25/2022 1245 Gross per 24 hour  Intake 2250 ml  Output --  Net 2250 ml    Labs/Imaging Results for orders placed or performed during the hospital encounter of 08/25/22 (from the past 48 hour(s))  Resp panel by RT-PCR (RSV, Flu A&B, Covid) Anterior Nasal Swab     Status: None   Collection Time: 08/25/22  8:09 AM   Specimen: Anterior Nasal Swab  Result Value Ref Range   SARS Coronavirus 2 by RT PCR NEGATIVE NEGATIVE   Influenza A by PCR NEGATIVE NEGATIVE   Influenza B by PCR NEGATIVE NEGATIVE    Comment: (NOTE) The Xpert Xpress SARS-CoV-2/FLU/RSV  plus assay is intended as an aid in the diagnosis of influenza from Nasopharyngeal swab specimens and should not be used as a sole basis for treatment. Nasal washings and aspirates are unacceptable for Xpert Xpress SARS-CoV-2/FLU/RSV testing.  Fact Sheet for Patients: EntrepreneurPulse.com.au  Fact Sheet for Healthcare Providers: IncredibleEmployment.be  This test is not yet approved or cleared by the [place] FDA and has been authorized for detection and/or diagnosis of SARS-CoV-2 by FDA under an Emergency Use Authorization (EUA). This EUA will remain in effect (meaning this test can be used) for the duration of the COVID-19 declaration under Section 564(b)(1) of the Act, 21 U.S.C. section 360bbb-3(b)(1), unless the authorization is terminated or revoked.     Resp Syncytial Virus by PCR NEGATIVE NEGATIVE    Comment: (NOTE) Fact Sheet for Patients: EntrepreneurPulse.com.au  Fact Sheet for Healthcare Providers: IncredibleEmployment.be  This test is not yet approved or cleared by the [place] FDA and has been authorized for detection and/or diagnosis of SARS-CoV-2 by FDA under an Emergency Use Authorization (EUA). This EUA will remain in effect (meaning this test can be used) for the duration of the COVID-19 declaration under Section 564(b)(1) of the Act, 21 U.S.C. section 360bbb-3(b)(1), unless the authorization is terminated or revoked.  Performed at [facility], [place] [address] [place], [place] [address]   Comprehensive metabolic panel     Status: Abnormal   Collection Time: 08/25/22  8:14 AM  Result Value Ref Range   Sodium 140 135 - 145 mmol/L   Potassium 3.9 3.5 - 5.1 mmol/L   Chloride 107 98 - 111 mmol/L   CO2 21 (L) 22 - 32 mmol/L   Glucose, Bld 216 (H) 70 - 99 mg/dL    Comment: Glucose reference range applies only to samples taken after fasting for at least 8 hours.   BUN 11  6 - 20 mg/dL   Creatinine, Ser 1.00 0.44 - 1.00 mg/dL   Calcium 9.0 8.9 - 10.3 mg/dL   Total Protein 7.7 6.5 - 8.1 g/dL   Albumin 3.8 3.5 - 5.0 g/dL   AST 29 15 - 41 U/L   ALT 20 0 - 44 U/L   Alkaline Phosphatase 87 38 - 126 U/L   Total Bilirubin 0.3 0.3 - 1.2 mg/dL   GFR, Estimated >60 >60 mL/min    Comment: (NOTE) Calculated using the CKD-EPI Creatinine Equation (2021)    Anion gap 12 5 - 15    Comment: Performed at [facility] [address], [place], [place] [address]  Lipase, blood     Status: Abnormal   Collection Time: 08/25/22  8:14 AM  Result Value Ref Range  Lipase 72 (H) 11 - 51 U/L    Comment: Performed at [facility], [place] [address], [place], [place] [address]  CBC with Diff     Status: Abnormal   Collection Time: 08/25/22  8:14 AM  Result Value Ref Range   WBC 8.6 4.0 - 10.5 K/uL   RBC 4.96 3.87 - 5.11 MIL/uL   Hemoglobin 12.1 12.0 - 15.0 g/dL   HCT 40.6 36.0 - 46.0 %   MCV 81.9 80.0 - 100.0 fL   MCH 24.4 (L) 26.0 - 34.0 pg   MCHC 29.8 (L) 30.0 - 36.0 g/dL   RDW 14.1 11.5 - 15.5 %   Platelets 370 150 - 400 K/uL    Comment: REPEATED TO VERIFY   nRBC 0.0 0.0 - 0.2 %   Neutrophils Relative % 88 %   Neutro Abs 7.6 1.7 - 7.7 K/uL   Lymphocytes Relative 7 %   Lymphs Abs 0.6 (L) 0.7 - 4.0 K/uL   Monocytes Relative 3 %   Monocytes Absolute 0.3 0.1 - 1.0 K/uL   Eosinophils Relative 1 %   Eosinophils Absolute 0.1 0.0 - 0.5 K/uL   Basophils Relative 0 %   Basophils Absolute 0.0 0.0 - 0.1 K/uL   Immature Granulocytes 1 %   Abs Immature Granulocytes 0.05 0.00 - 0.07 K/uL    Comment: Performed at [facility], [place] [address] [place], [place] [address]  I-Stat beta hCG blood, ED     Status: None   Collection Time: 08/25/22  8:31 AM  Result Value Ref Range   I-stat hCG, quantitative <5.0 <5 mIU/mL   Comment 3            Comment:   GEST. AGE      CONC.  (mIU/mL)   <=1 WEEK        5 - 50     2 WEEKS       50 - 500     3 WEEKS       100 -  10,000     4 WEEKS     1,000 - 30,000        FEMALE AND NON-PREGNANT FEMALE:     LESS THAN 5 mIU/mL   Urinalysis, Routine w reflex microscopic -Urine, Clean Catch     Status: Abnormal   Collection Time: 08/25/22 10:01 AM  Result Value Ref Range   Color, Urine YELLOW YELLOW   APPearance CLEAR CLEAR   Specific Gravity, Urine >1.046 (H) 1.005 - 1.030   pH 5.0 5.0 - 8.0   Glucose, UA >=500 (A) NEGATIVE mg/dL   Hgb urine dipstick NEGATIVE NEGATIVE   Bilirubin Urine NEGATIVE NEGATIVE   Ketones, ur NEGATIVE NEGATIVE mg/dL   Protein, ur NEGATIVE NEGATIVE mg/dL   Nitrite NEGATIVE NEGATIVE   Leukocytes,Ua NEGATIVE NEGATIVE   RBC / HPF 0-5 0 - 5 RBC/hpf   WBC, UA 0-5 0 - 5 WBC/hpf   Bacteria, UA NONE SEEN NONE SEEN   Squamous Epithelial / HPF 0-5 0 - 5 /HPF    Comment: Performed at [facility], [place] [address], [place],  [address]   CT ABDOMEN PELVIS W CONTRAST  Result Date: 08/25/2022 CLINICAL DATA:  Acute generalized abdominal pain. EXAM: CT ABDOMEN AND PELVIS WITH CONTRAST TECHNIQUE: Multidetector CT imaging of the abdomen and pelvis was performed using the standard protocol following bolus administration of intravenous contrast. RADIATION DOSE REDUCTION: This exam was performed according to the departmental dose-optimization program which  includes automated exposure control, adjustment of the mA and/or kV according to patient size and/or use of iterative reconstruction technique. CONTRAST:  17mL OMNIPAQUE IOHEXOL 350 MG/ML SOLN COMPARISON:  May 21, 2022. FINDINGS: Lower chest: No acute abnormality. Hepatobiliary: No focal liver abnormality is seen. No gallstones, gallbladder wall thickening, or biliary dilatation. Pancreas: Unremarkable. No pancreatic ductal dilatation or surrounding inflammatory changes. Spleen: Normal in size without focal abnormality. Adrenals/Urinary Tract: Adrenal glands are unremarkable. Kidneys are normal, without renal calculi, focal lesion, or  hydronephrosis. Bladder is unremarkable. Stomach/Bowel: Stomach is within normal limits. Appendix appears normal. No evidence of bowel wall thickening, distention, or inflammatory changes. Vascular/Lymphatic: No significant vascular findings are present. No enlarged abdominal or pelvic lymph nodes. Reproductive: Uterus and bilateral adnexa are unremarkable. Other: Small fat containing periumbilical hernia is noted. No ascites is noted. Musculoskeletal: No acute or significant osseous findings. IMPRESSION: Small fat containing periumbilical hernia. No significant abnormality seen in the abdomen or pelvis. Electronically Signed   By: Marijo Conception M.D.   On: 08/25/2022 10:32   DG Chest 2 View  Result Date: 08/25/2022 CLINICAL DATA:  Nausea and vomiting for 1 week, initial encounter EXAM: CHEST - 2 VIEW COMPARISON:  05/21/2022 CT FINDINGS: The heart size and mediastinal contours are within normal limits. Both lungs are clear. The visualized skeletal structures are unremarkable. IMPRESSION: No active cardiopulmonary disease. Electronically Signed   By: Inez Catalina M.D.   On: 08/25/2022 09:56    Pending Labs Unresulted Labs (From admission, onward)     Start     Ordered   08/26/22 [id number]  Basic metabolic panel  Tomorrow morning,   R        08/25/22 1333   08/26/22 0500  CBC  Tomorrow morning,   R        08/25/22 1333   08/25/22 1333  Gastrointestinal Panel by PCR , Stool  (Gastrointestinal Panel by PCR, Stool                                                                                                                                                     **Does Not include CLOSTRIDIUM DIFFICILE testing. **If CDIFF testing is needed, place order from the "C Difficile Testing" order set.**)  Once,   R        08/25/22 1333   08/25/22 1332  Hemoglobin A1c  Once,   R       Comments: To assess prior glycemic control    08/25/22 1333            Vitals/Pain Today's Vitals   08/25/22 1214 08/25/22  1215 08/25/22 1223 08/25/22 1300  BP:  139/82 139/82 134/86  Pulse:  (!) 127 (!) 124 (!) 123  Resp:  20  13  Temp:   98.3 F (36.8 C)   TempSrc:  Oral   SpO2:  94% 96% 96%  Weight:      Height:      PainSc: 10-Worst pain ever       Isolation Precautions Enteric precautions (UV disinfection)  Medications Medications  sodium chloride 0.9 % bolus 1,000 mL (0 mLs Intravenous Stopped 08/25/22 1008)    And  0.9 %  sodium chloride infusion (0 mLs Intravenous Stopped 08/25/22 1108)  ibuprofen (ADVIL) tablet 800 mg (has no administration in time range)  oxyCODONE-acetaminophen (PERCOCET) 10-325 MG per tablet 1 tablet (has no administration in time range)  elvitegravir-cobicistat-emtricitabine-tenofovir (GENVOYA) 150-150-200-10 MG tablet 1 tablet (has no administration in time range)  atorvastatin (LIPITOR) tablet 10 mg (has no administration in time range)  hydrALAZINE (APRESOLINE) tablet 25 mg (has no administration in time range)  0.9 %  sodium chloride infusion (has no administration in time range)  sertraline (ZOLOFT) tablet 100 mg (has no administration in time range)  traZODone (DESYREL) tablet 100 mg (has no administration in time range)  Insulin Glargine SOPN 50 Units (has no administration in time range)  ondansetron (ZOFRAN) injection 4 mg (has no administration in time range)  phenol (CHLORASEPTIC) mouth spray 1 spray (has no administration in time range)  hydrocortisone (ANUSOL-HC) 2.5 % rectal cream (has no administration in time range)  baclofen (LIORESAL) tablet 10 mg (has no administration in time range)  pregabalin (LYRICA) capsule 100 mg (has no administration in time range)  diphenhydrAMINE (BENADRYL) capsule 50 mg (has no administration in time range)  insulin aspart (novoLOG) injection 0-5 Units (has no administration in time range)  insulin aspart (novoLOG) injection 0-20 Units (has no administration in time range)  enoxaparin (LOVENOX) injection 40 mg (has no  administration in time range)  metoCLOPramide (REGLAN) injection 10 mg (10 mg Intravenous Given 08/25/22 0817)  iohexol (OMNIPAQUE) 350 MG/ML injection 75 mL (75 mLs Intravenous Contrast Given 08/25/22 1022)  lactated ringers bolus 1,000 mL (0 mLs Intravenous Stopped 08/25/22 1245)  fentaNYL (SUBLIMAZE) injection 50 mcg (50 mcg Intravenous Given 08/25/22 1204)  ondansetron (ZOFRAN) injection 4 mg (4 mg Intravenous Given 08/25/22 1247)    Mobility walks  Focused Assessments   R Recommendations: See Admitting Provider Note  Report given to:   Additional Notes: Pt mom is at the bedside with pt. Pt mom helps keep pt calm and reassurance she is ok.

## 2022-08-25 NOTE — ED Notes (Signed)
Pt requesting nausea medication. EDP notified 

## 2022-08-25 NOTE — ED Notes (Signed)
Pt requested to use a urinal at the bedside d/t not wanting to ambulate to the restroom. RN offered bedpan but pt stated urinal works best.   Pt requested gown be removed because it's uncomfortable. RN removed gown and provided blankets.   Pt attempted to use urinal but unsuccessful. Pt states lower back is hurting and she feels she has to urinate but nothing is happening. RN will notify EDP

## 2022-08-25 NOTE — ED Notes (Signed)
RN provided warm blanket

## 2022-08-25 NOTE — ED Provider Notes (Signed)
[facility] Provider Note   CSN: [id number] Arrival date & time: 08/25/22  0745     History  Chief Complaint  Patient presents with   Nausea    Brenda [name] is a 52 y.o. female.  HPI Patient presents via EMS with concern for nausea, vomiting, loose stool and weakness. Patient received fluids, Zofran and route with decrease in vomiting per EMS. She has a history of HIV, diabetes, was well until about a week ago.  After onset of nausea, vomiting, diarrhea, weakness that she went to her physicians office.  She reports her testing was normal, which was started on antibiotics for presumptive infection.  She is doing generally well until the past day or so when she developed recurrence of diffuse weakness without focal pain aside from abdominal discomfort, and now has had multiple episodes of vomiting, diarrhea, over the past few hours, including inability to clean herself or get to the bathroom. She notes her HIV is well-controlled, diabetes well-controlled, was so prior to this illness.    Home Medications Prior to Admission medications   Medication Sig Start Date End Date Taking? Authorizing Provider  ACCU-CHEK GUIDE test strip 3 (three) times daily. for testing 10/18/18   [provider]  atorvastatin (LIPITOR) 10 MG tablet Take 1 tablet (10 mg total) by mouth daily. 11/05/21   Campbell Riches, MD  baclofen (LIORESAL) 10 MG tablet Take 10 mg by mouth 3 (three) times daily as needed. 08/13/21   [provider]  Cholecalciferol 1.25 MG (50000 UT) capsule cholecalciferol (vitamin D3) 1,250 mcg (50,000 unit) capsule    [provider]  diphenhydrAMINE (BENADRYL) 25 mg capsule Take 50 mg by mouth daily as needed for allergies.    [provider]  elvitegravir-cobicistat-emtricitabine-tenofovir (GENVOYA) 150-150-200-10 MG TABS tablet TAKE 1 TABLET BY MOUTH DAILY WITH BREAKFAST. 11/05/21 11/05/22  Campbell Riches, MD  empagliflozin (JARDIANCE) 25 MG TABS tablet Jardiance 25 mg tablet    [provider]  fluconazole (DIFLUCAN) 100 MG tablet Take 100 mg by mouth daily. 07/30/21   [provider]  hydrocortisone (ANUSOL-HC) 2.5 % rectal cream Place 1 application rectally 2 (two) times daily. For hemorrhoids Patient taking differently: Place 1 application. rectally as needed. For hemorrhoids 07/26/18   Gatha Mayer, MD  ibuprofen (ADVIL) 800 MG tablet Take 800 mg by mouth 2 (two) times daily as needed. 07/10/21   [provider]  Insulin Glargine 300 UNIT/ML SOPN Inject 50 Units into the skin 2 (two) times daily. toujeo    [provider]  losartan (COZAAR) 100 MG tablet TAKE 1 TABLET BY MOUTH ONCE DAILY THIS REPLACES LISINORIL TO PROTECT KIDNEY IN DIABETES ABOVE AND BEYOND BLOOD PRESSURE CONTROL 10/09/18   [provider]  LYRICA 100 MG capsule Take 100 mg by mouth 3 (three) times daily. 08/09/21   [provider]  metFORMIN (GLUCOPHAGE) 1000 MG tablet Take 1 tablet (1,000 mg total) by mouth 2 (two) times daily. 11/12/21   Orvis Brill, MD  ondansetron (ZOFRAN ODT) 4 MG disintegrating tablet Take 1 tablet (4 mg total) by mouth every 8 (eight) hours as needed for nausea or vomiting. 02/16/21   Pieter Partridge, DO  oxyCODONE-acetaminophen (PERCOCET) 10-325 MG tablet Take 1 tablet by mouth 4 (four) times daily as needed for pain.    [provider]  OZEMPIC, 2 MG/DOSE, 8 MG/3ML SOPN Inject 2 mg into the skin once a week. 08/17/21  [provider]  sertraline (ZOLOFT) 100 MG tablet Take 100 mg by mouth at bedtime.    [provider]  traZODone (DESYREL) 100 MG tablet Take 100 mg by mouth at bedtime. 11/05/16   [provider]      Allergies    Cortisone, Hydrocodone-acetaminophen, and Morphine    Review of Systems   Review of Systems  All other systems reviewed and are negative.   Physical Exam Updated Vital  Signs BP 139/82 (BP Location: Right Arm)   Pulse (!) 124   Temp 98.3 F (36.8 C) (Oral)   Resp 20   Ht 5\' 8"  (1.727 m)   Wt 90.7 kg   SpO2 96%   BMI 30.41 kg/m  Physical Exam Vitals and nursing note reviewed.  Constitutional:      General: She is not in acute distress.    Appearance: She is well-developed. She is obese. She is ill-appearing.  HENT:     Head: Normocephalic and atraumatic.  Eyes:     Conjunctiva/sclera: Conjunctivae normal.  Cardiovascular:     Rate and Rhythm: Regular rhythm. Tachycardia present.  Pulmonary:     Effort: Pulmonary effort is normal. No respiratory distress.     Breath sounds: Normal breath sounds. No stridor.  Abdominal:     General: There is no distension.     Tenderness: There is abdominal tenderness.  Skin:    General: Skin is warm and dry.  Neurological:     Mental Status: She is alert and oriented to person, place, and time.     Cranial Nerves: No cranial nerve deficit.  Psychiatric:        Mood and Affect: Mood normal.     ED Results / Procedures / Treatments   Labs (all labs ordered are listed, but only abnormal results are displayed) Labs Reviewed  COMPREHENSIVE METABOLIC PANEL - Abnormal; Notable for the following components:      Result Value   CO2 21 (*)    Glucose, Bld 216 (*)    All other components within normal limits  LIPASE, BLOOD - Abnormal; Notable for the following components:   Lipase 72 (*)    All other components within normal limits  CBC WITH DIFFERENTIAL/PLATELET - Abnormal; Notable for the following components:   MCH 24.4 (*)    MCHC 29.8 (*)    Lymphs Abs 0.6 (*)    All other components within normal limits  URINALYSIS, ROUTINE W REFLEX MICROSCOPIC - Abnormal; Notable for the following components:   Specific Gravity, Urine >1.046 (*)    Glucose, UA >=500 (*)    All other components within normal limits  RESP PANEL BY RT-PCR (RSV, FLU A&B, COVID)  [id number]  I-STAT BETA HCG BLOOD, ED ([facility], [facility], [facility] ONLY)     EKG EKG Interpretation  Date/Time:  Wednesday August 25 2022 07:56:56 EDT Ventricular Rate:  111 PR Interval:  173 QRS Duration: 84 QT Interval:  342 QTC Calculation: 465 R Axis:   79 Text Interpretation: Sinus tachycardia Borderline T abnormalities, anterior leads Artifact in lead(s) I III aVR aVL aVF V1 Abnormal ECG Confirmed by Carmin Muskrat [phone]) on 08/25/2022 8:09:33 AM  Radiology CT ABDOMEN PELVIS W CONTRAST  Result Date: 08/25/2022 CLINICAL DATA:  Acute generalized abdominal pain. EXAM: CT ABDOMEN AND PELVIS WITH CONTRAST TECHNIQUE: Multidetector CT imaging of the abdomen and pelvis was performed using the standard protocol following bolus administration of intravenous contrast. RADIATION DOSE REDUCTION: This exam was performed according to the departmental dose-optimization  program which includes automated exposure control, adjustment of the mA and/or kV according to patient size and/or use of iterative reconstruction technique. CONTRAST:  78mL OMNIPAQUE IOHEXOL 350 MG/ML SOLN COMPARISON:  May 21, 2022. FINDINGS: Lower chest: No acute abnormality. Hepatobiliary: No focal liver abnormality is seen. No gallstones, gallbladder wall thickening, or biliary dilatation. Pancreas: Unremarkable. No pancreatic ductal dilatation or surrounding inflammatory changes. Spleen: Normal in size without focal abnormality. Adrenals/Urinary Tract: Adrenal glands are unremarkable. Kidneys are normal, without renal calculi, focal lesion, or hydronephrosis. Bladder is unremarkable. Stomach/Bowel: Stomach is within normal limits. Appendix appears normal. No evidence of bowel wall thickening, distention, or inflammatory changes. Vascular/Lymphatic: No significant vascular findings are present. No enlarged abdominal or pelvic lymph nodes. Reproductive: Uterus and bilateral adnexa are unremarkable. Other: Small fat containing periumbilical hernia is noted. No ascites is noted. Musculoskeletal: No acute or  significant osseous findings. IMPRESSION: Small fat containing periumbilical hernia. No significant abnormality seen in the abdomen or pelvis. Electronically Signed   By: Marijo Conception M.D.   On: 08/25/2022 10:32   DG Chest 2 View  Result Date: 08/25/2022 CLINICAL DATA:  Nausea and vomiting for 1 week, initial encounter EXAM: CHEST - 2 VIEW COMPARISON:  05/21/2022 CT FINDINGS: The heart size and mediastinal contours are within normal limits. Both lungs are clear. The visualized skeletal structures are unremarkable. IMPRESSION: No active cardiopulmonary disease. Electronically Signed   By: Inez Catalina M.D.   On: 08/25/2022 09:56    Procedures Procedures    Medications Ordered in ED Medications  sodium chloride 0.9 % bolus 1,000 mL (0 mLs Intravenous Stopped 08/25/22 1008)    And  0.9 %  sodium chloride infusion (0 mLs Intravenous Stopped 08/25/22 1108)  metoCLOPramide (REGLAN) injection 10 mg (10 mg Intravenous Given 08/25/22 0817)  iohexol (OMNIPAQUE) 350 MG/ML injection 75 mL (75 mLs Intravenous Contrast Given 08/25/22 1022)  lactated ringers bolus 1,000 mL (0 mLs Intravenous Stopped 08/25/22 1245)  fentaNYL (SUBLIMAZE) injection 50 mcg (50 mcg Intravenous Given 08/25/22 1204)  ondansetron (ZOFRAN) injection 4 mg (4 mg Intravenous Given 08/25/22 1247)    ED Course/ Medical Decision Making/ A&P This patient with a Hx of HIV, diabetes presents to the ED for concern of nausea, vomiting, diarrhea, weakness, abdominal pain, this involves an extensive number of treatment options, and is a complaint that carries with it a high risk of complications and morbidity.    The differential diagnosis includes intra-abdominal infection, opportunistic infection, COVID, flu, bacteremia, sepsis   Social Determinants of Health:  HIV  Additional history obtained:  Additional history and/or information obtained from EMS, notable for above   After the initial evaluation, orders, including: Labs fluids  Reglan CT x-ray were initiated.   Patient placed on Cardiac and Pulse-Oximetry Monitors. The patient was maintained on a cardiac monitor.  The cardiac monitored showed an rhythm of 111 sinus tach abnormal The patient was also maintained on pulse oximetry. The readings were typically 99% room air normal  Dispostion / Final MDM: On repeat evaluation of the patient stayed the same Patient remains tachycardic, but not hypotensive, not febrile.  I have reviewed the patient's labs, CT, discussed with the patient and her mother at bedside.  No CT evidence for acute obstruction or other acute phenomena.  Labs consistent with dehydration with concentrated urine, elevated BUN.  Patient has received 2 L fluid resuscitation, continues to have nausea, vomiting, though her pain has improved after multiple doses of narcotics. In the context of the patient  with HIV, new persistent nausea, vomiting, p.o. intolerance including of her meds, she will require admission for further monitoring, management.  Lab Tests:  I personally interpreted labs.  The pertinent results include: As above consistent with dehydration  Imaging Studies ordered:  I independently visualized and interpreted imaging which showed no acute obstruction I agree with the radiologist interpretation  Final Clinical Impression(s) / ED Diagnoses Final diagnoses:  Nausea vomiting and diarrhea  Generalized abdominal pain     Carmin Muskrat, MD 08/25/22 1254

## 2022-08-25 NOTE — H&P (Signed)
History and Physical    Brenda [name] [id number] DOB: [birth year] DOA: 08/25/2022  PCP: Sherald Hess., MD (Confirm with patient/family/NH records and if not entered, this has to be entered at [facility] point of entry) Patient coming from: Home  I have personally briefly reviewed patient's old medical records in [place]  Chief Complaint: Nauseous vomiting abdominal pain diarrhea  HPI: Brenda [name] is a 52 y.o. female with medical history significant of HIV on HAART, IDDM, HTN, HLD, anxiety/depression, hemorrhoids, presented with of nauseous vomiting, abdominal pain diarrhea.  Symptoms started 5-6 days ago, patient first developed oral thrush and pain when swallowing as well as feeling nausea and frequent vomiting, for which he went to see her PCP who diagnosed her with oral thrush and started on fluconazole.  Next day, patient drank some grape juice by mistake, for that she has history of allergy to grape.  Soon she started to develop watery diarrhea and sharp-like abdominal pain constant, " all around", no tenesmus denies any fever or chills.  Denies any foul smell of the diarrhea.  No sick contact.  He reported improvement of oral thrush and no more pain when swallowing but still having nauseous and occasional vomiting of stomach content none bile nonbloody.  She also reported worsening of hemorrhoid after passing frequent bouts of diarrhea.  ED Course: Afebrile, tachycardia, blood pressure 135/84, nonhypoxic.  CT abdomen pelvis showed small fat-containing periumbilical hernia but no other acute findings.  Blood work showed creatinine 1.0, K3.9, bicarb 21, WBC 8.6.  Patient was given multiple rounds of Zofran IV fluid, but continued to feel very nauseous abdominal pain  Review of Systems: As per HPI otherwise 14 point review of systems negative.    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 Raphael Gibney 2011   HYSTEROSCOPY WITH D & C N/A 04/12/2018   Procedure: DILATATION AND CURETTAGE /HYSTEROSCOPY;  Surgeon: Ena Dawley, MD;  Location: [facility];  Service: Gynecology;  Laterality: N/A;   LAPAROSCOPIC LYSIS OF ADHESIONS  01/10/2014   Procedure: LAPAROSCOPIC LYSIS OF ADHESIONS;  Surgeon: Ena Dawley, MD;  Location: [facility];  Service: Gynecology;;   LAPAROSCOPY N/A 01/10/2014   Procedure: LAPAROSCOPY OPERATIVE;  Surgeon: Ena Dawley, MD;  Location: [facility];  Service: Gynecology;  Laterality: N/A;   TUBAL LIGATION     UMBILICAL HERNIA REPAIR  01/10/2014   Procedure: HERNIA REPAIR UMBILICAL ADULT;  Surgeon: Ena Dawley, MD;  Location: [facility];  Service: Gynecology;;  WISDOM TOOTH EXTRACTION       reports that she has never smoked. She has never been exposed to tobacco smoke. She has never used smokeless tobacco. She reports that she does not drink alcohol and does not use drugs.  Allergies  Allergen Reactions   Cortisone Itching and Other (See Comments)    REACTION: "nerves"   Hydrocodone-Acetaminophen Itching and Other (See Comments)    REACTION: "nerves"   Morphine Itching and Other (See Comments)    REACTION: "nerves"    Family History  Problem Relation Age of Onset   Diabetes Mother    Heart failure Mother     Heart failure Father    Diabetes Father    Heart failure Brother    Diabetes Brother    Diabetes Maternal Grandmother    Alzheimer's disease Paternal Grandmother    Colon cancer Neg Hx    Colon polyps Neg Hx    Celiac disease Neg Hx    Crohn's disease Neg Hx    Esophageal cancer Neg Hx    Rectal cancer Neg Hx    Stomach cancer Neg Hx      Prior to Admission medications   Medication Sig Start Date End Date Taking? Authorizing Provider  ACCU-CHEK GUIDE test strip 3 (three) times daily. for testing 10/18/18   [provider]  atorvastatin (LIPITOR) 10 MG tablet Take 1 tablet (10 mg total) by mouth daily. 11/05/21   Campbell Riches, MD  baclofen (LIORESAL) 10 MG tablet Take 10 mg by mouth 3 (three) times daily as needed. 08/13/21   [provider]  Cholecalciferol 1.25 MG (50000 UT) capsule cholecalciferol (vitamin D3) 1,250 mcg (50,000 unit) capsule    [provider]  diphenhydrAMINE (BENADRYL) 25 mg capsule Take 50 mg by mouth daily as needed for allergies.    [provider]  elvitegravir-cobicistat-emtricitabine-tenofovir (GENVOYA) 150-150-200-10 MG TABS tablet TAKE 1 TABLET BY MOUTH DAILY WITH BREAKFAST. 11/05/21 11/05/22  Campbell Riches, MD  empagliflozin (JARDIANCE) 25 MG TABS tablet Jardiance 25 mg tablet    [provider]  fluconazole (DIFLUCAN) 100 MG tablet Take 100 mg by mouth daily. 07/30/21   [provider]  ibuprofen (ADVIL) 800 MG tablet Take 800 mg by mouth 2 (two) times daily as needed. 07/10/21   [provider]  Insulin Glargine 300 UNIT/ML SOPN Inject 50 Units into the skin 2 (two) times daily. toujeo    [provider]  losartan (COZAAR) 100 MG tablet TAKE 1 TABLET BY MOUTH ONCE DAILY THIS REPLACES LISINORIL TO PROTECT KIDNEY IN DIABETES ABOVE AND BEYOND BLOOD PRESSURE CONTROL 10/09/18   [provider]  LYRICA 100 MG capsule Take 100 mg by mouth 3 (three) times daily. 08/09/21   [provider]  metFORMIN (GLUCOPHAGE) 1000 MG tablet Take 1 tablet (1,000 mg total) by mouth 2 (two) times daily. 11/12/21   Orvis Brill, MD  ondansetron (ZOFRAN ODT) 4 MG disintegrating tablet Take 1 tablet (4 mg total) by mouth every 8 (eight) hours as needed for nausea or vomiting. 02/16/21   Pieter Partridge, DO  oxyCODONE-acetaminophen (PERCOCET) 10-325 MG tablet Take 1 tablet by mouth 4 (four) times daily as needed for pain.    [provider]  OZEMPIC, 2 MG/DOSE, 8 MG/3ML SOPN Inject 2 mg into the skin once a week. 08/17/21   [provider]  sertraline (ZOLOFT) 100 MG tablet Take 100 mg by mouth at bedtime.    [provider]  traZODone (  DESYREL) 100 MG tablet Take 100 mg by mouth at bedtime. 11/05/16   [provider]    Physical Exam: Vitals:   08/25/22 1223 08/25/22 1300 08/25/22 1330 08/25/22 1340  BP: 139/82 134/86 (!) 132/95 (!) 132/95  Pulse: (!) 124 (!) 123 (!) 126 (!) 126  Resp:  13 (!) 21 15  Temp: 98.3 F (36.8 C)   99.1 F (37.3 C)  TempSrc: Oral   Oral  SpO2: 96% 96% 96% 95%  Weight:      Height:        Constitutional: NAD, calm, comfortable Vitals:   08/25/22 1223 08/25/22 1300 08/25/22 1330 08/25/22 1340  BP: 139/82 134/86 (!) 132/95 (!) 132/95  Pulse: (!) 124 (!) 123 (!) 126 (!) 126  Resp:  13 (!) 21 15  Temp: 98.3 F (36.8 C)   99.1 F (37.3 C)  TempSrc: Oral   Oral  SpO2: 96% 96% 96% 95%  Weight:      Height:       Eyes: PERRL, lids and conjunctivae normal ENMT: Mucous membranes are dry. Posterior pharynx clear of any exudate or lesions.Normal dentition.  Neck: normal, supple, no masses, no thyromegaly Respiratory: clear to auscultation bilaterally, no wheezing, no crackles. Normal respiratory effort. No accessory muscle use.  Cardiovascular: Regular rate and rhythm, no murmurs / rubs / gallops. No extremity edema. 2+ pedal pulses. No carotid bruits.  Abdomen: mild tenderness on periumbilical area, no rebound no  guarding, no masses palpated. No hepatosplenomegaly. Bowel sounds positive.  Musculoskeletal: no clubbing / cyanosis. No joint deformity upper and lower extremities. Good ROM, no contractures. Normal muscle tone.  Skin: no rashes, lesions, ulcers. No induration Neurologic: CN 2-12 grossly intact. Sensation intact, DTR normal. Strength 5/5 in all 4.  Psychiatric: Normal judgment and insight. Alert and oriented x 3. Normal mood.     Labs on Admission: I have personally reviewed following labs and imaging studies  CBC: Recent Labs  Lab 08/25/22 0814  WBC 8.6  NEUTROABS 7.6  HGB 12.1  HCT 40.6  MCV 81.9  PLT 0000000   Basic Metabolic Panel: Recent Labs  Lab 08/25/22 0814  NA 140  K 3.9  CL 107  CO2 21*  GLUCOSE 216*  BUN 11  CREATININE 1.00  CALCIUM 9.0   GFR: Estimated Creatinine Clearance: 76.6 mL/min (by C-G formula based on SCr of 1 mg/dL). Liver Function Tests: Recent Labs  Lab 08/25/22 0814  AST 29  ALT 20  ALKPHOS 87  BILITOT 0.3  PROT 7.7  ALBUMIN 3.8   Recent Labs  Lab 08/25/22 0814  LIPASE 72*   No results for input(s): "AMMONIA" in the last 168 hours. Coagulation Profile: No results for input(s): "INR", "PROTIME" in the last 168 hours. Cardiac Enzymes: No results for input(s): "CKTOTAL", "CKMB", "CKMBINDEX", "TROPONINI" in the last 168 hours. BNP (last 3 results) No results for input(s): "PROBNP" in the last 8760 hours. HbA1C: No results for input(s): "HGBA1C" in the last 72 hours. CBG: No results for input(s): "GLUCAP" in the last 168 hours. Lipid Profile: No results for input(s): "CHOL", "HDL", "LDLCALC", "TRIG", "CHOLHDL", "LDLDIRECT" in the last 72 hours. Thyroid Function Tests: No results for input(s): "TSH", "T4TOTAL", "FREET4", "T3FREE", "THYROIDAB" in the last 72 hours. Anemia Panel: No results for input(s): "VITAMINB12", "FOLATE", "FERRITIN", "TIBC", "IRON", "RETICCTPCT" in the last 72 hours. Urine analysis:    Component Value  Date/Time   COLORURINE YELLOW 08/25/2022 1001   APPEARANCEUR CLEAR 08/25/2022 1001   LABSPEC >1.046 (H)  08/25/2022 1001   PHURINE 5.0 08/25/2022 1001   GLUCOSEU >=500 (A) 08/25/2022 1001   HGBUR NEGATIVE 08/25/2022 1001   BILIRUBINUR NEGATIVE 08/25/2022 1001   BILIRUBINUR n 08/11/2016 1321   KETONESUR NEGATIVE 08/25/2022 1001   PROTEINUR NEGATIVE 08/25/2022 1001   UROBILINOGEN 0.2 08/11/2016 1321   UROBILINOGEN 1.0 12/22/2013 0155   NITRITE NEGATIVE 08/25/2022 1001   LEUKOCYTESUR NEGATIVE 08/25/2022 1001    Radiological Exams on Admission: CT ABDOMEN PELVIS W CONTRAST  Result Date: 08/25/2022 CLINICAL DATA:  Acute generalized abdominal pain. EXAM: CT ABDOMEN AND PELVIS WITH CONTRAST TECHNIQUE: Multidetector CT imaging of the abdomen and pelvis was performed using the standard protocol following bolus administration of intravenous contrast. RADIATION DOSE REDUCTION: This exam was performed according to the departmental dose-optimization program which includes automated exposure control, adjustment of the mA and/or kV according to patient size and/or use of iterative reconstruction technique. CONTRAST:  35mL OMNIPAQUE IOHEXOL 350 MG/ML SOLN COMPARISON:  May 21, 2022. FINDINGS: Lower chest: No acute abnormality. Hepatobiliary: No focal liver abnormality is seen. No gallstones, gallbladder wall thickening, or biliary dilatation. Pancreas: Unremarkable. No pancreatic ductal dilatation or surrounding inflammatory changes. Spleen: Normal in size without focal abnormality. Adrenals/Urinary Tract: Adrenal glands are unremarkable. Kidneys are normal, without renal calculi, focal lesion, or hydronephrosis. Bladder is unremarkable. Stomach/Bowel: Stomach is within normal limits. Appendix appears normal. No evidence of bowel wall thickening, distention, or inflammatory changes. Vascular/Lymphatic: No significant vascular findings are present. No enlarged abdominal or pelvic lymph nodes. Reproductive:  Uterus and bilateral adnexa are unremarkable. Other: Small fat containing periumbilical hernia is noted. No ascites is noted. Musculoskeletal: No acute or significant osseous findings. IMPRESSION: Small fat containing periumbilical hernia. No significant abnormality seen in the abdomen or pelvis. Electronically Signed   By: Marijo Conception M.D.   On: 08/25/2022 10:32   DG Chest 2 View  Result Date: 08/25/2022 CLINICAL DATA:  Nausea and vomiting for 1 week, initial encounter EXAM: CHEST - 2 VIEW COMPARISON:  05/21/2022 CT FINDINGS: The heart size and mediastinal contours are within normal limits. Both lungs are clear. The visualized skeletal structures are unremarkable. IMPRESSION: No active cardiopulmonary disease. Electronically Signed   By: Inez Catalina M.D.   On: 08/25/2022 09:56    EKG: Independently reviewed.  Sinus tachycardia, no acute ST-T changes.  Assessment/Plan Principal Problem:   Gastroenteritis Active Problems:   Human immunodeficiency virus (HIV) disease (H[facility])   DM2 (diabetes mellitus, type 2) ([place])   Diarrhea   Gastritis  (please populate well all problems here in Problem List. (For example, if patient is on BP meds at home and you resume or decide to hold them, it is a problem that needs to be her. Same for CAD, COPD, HLD and so on)  Intractable abdominal pain -With constant nauseous vomiting and diarrhea -Clinically suspect viral gastroenteritis.  Given the worsening of clinical course, will also check GI panel. -If GI panel is negative will consider Imodium -For now, will keep patient n.p.o., IV fluid and supportive care with Zofran and pain medications. -Other DDx, will send 1 set of troponin to rule out ACS. Symptoms not compatible with gastroparesis. Oral/esophageal was sufficiently treated.  IDDM with hyperglycemia -Poor oral intake, will cut down long-acting insulin from 50 unit BID to 50 unit daily -Sliding scale for now  HTN -Severe dehydrated, will hold off  home BP meds -Start as needed hydralazine  Diabetic neuropathy -Continue Lyrica  HIV -Has been compliant with HAART since 2007, last CD4> 200  and viral load undetected 4 months ago -Continue home HAART   DVT prophylaxis: Lovenox Code Status: Full code Family Communication: Daughter at bedside Disposition Plan: Expect less than 2 midnight hospital stay Consults called: None Admission status: Tele obs   Brenda Halt MD [facility] Pager [phone]  08/25/2022, 3:19 PM

## 2022-08-25 NOTE — Progress Notes (Signed)
Admission Note:  Pt arrived to unit from ED at approx. 1630. Pt ambulated from stretcher to bed. Pt on room air, and on enteric precautions to r/o c. Diff. Pt in NAD at this time.

## 2022-08-25 NOTE — ED Notes (Signed)
Pt ambulated to restroom without incident.

## 2022-08-25 NOTE — Plan of Care (Signed)

## 2022-08-25 NOTE — ED Notes (Signed)
Pt states she feels as if her kidneys are hurting. EDP aware

## 2022-08-25 NOTE — ED Notes (Signed)
Pt family at bedside stated pt called them alerting them no care has been completed. RN updated family on care and recommended family to console pt d/t being tearful.

## 2022-08-25 NOTE — ED Notes (Signed)
Pt u/t provide sample.

## 2022-08-25 NOTE — ED Notes (Signed)
Patient transported to CT 

## 2022-08-25 NOTE — ED Notes (Signed)
Pt states her hemorrhoids have been bleeding as well. EDP aware

## 2022-08-25 NOTE — ED Triage Notes (Signed)
Pt here from home with c/o n/v/d times 4 days , ems gave 4mg  of zofran and 300 of fluids prior to arrival

## 2022-08-25 NOTE — ED Notes (Signed)
Pt states she has been sick and weak for about a week. Today the pt started N/V/D with generalized aches that seems to be getting worse. Pt says she has the chills but she doesn't normally have a fever when she is sick.

## 2022-08-26 ENCOUNTER — Encounter: Payer: Self-pay | Admitting: Hematology and Oncology

## 2022-08-26 ENCOUNTER — Other Ambulatory Visit: Payer: Self-pay

## 2022-08-26 ENCOUNTER — Other Ambulatory Visit (HOSPITAL_COMMUNITY): Payer: Self-pay

## 2022-08-26 DIAGNOSIS — E1142 Type 2 diabetes mellitus with diabetic polyneuropathy: Secondary | ICD-10-CM

## 2022-08-26 DIAGNOSIS — Z794 Long term (current) use of insulin: Secondary | ICD-10-CM | POA: Diagnosis not present

## 2022-08-26 DIAGNOSIS — B20 Human immunodeficiency virus [HIV] disease: Secondary | ICD-10-CM

## 2022-08-26 DIAGNOSIS — K529 Noninfective gastroenteritis and colitis, unspecified: Secondary | ICD-10-CM | POA: Diagnosis not present

## 2022-08-26 LAB — CBC
HCT: 33.3 % — ABNORMAL LOW (ref 36.0–46.0)
Hemoglobin: 10.2 g/dL — ABNORMAL LOW (ref 12.0–15.0)
MCH: 24.8 pg — ABNORMAL LOW (ref 26.0–34.0)
MCHC: 30.6 g/dL (ref 30.0–36.0)
MCV: 81 fL (ref 80.0–100.0)
Platelets: 307 10*3/uL (ref 150–400)
RBC: 4.11 MIL/uL (ref 3.87–5.11)
RDW: 14.4 % (ref 11.5–15.5)
WBC: 6.3 10*3/uL (ref 4.0–10.5)
nRBC: 0 % (ref 0.0–0.2)

## 2022-08-26 LAB — BASIC METABOLIC PANEL
Anion gap: 6 (ref 5–15)
BUN: 12 mg/dL (ref 6–20)
CO2: 23 mmol/L (ref 22–32)
Calcium: 7.8 mg/dL — ABNORMAL LOW (ref 8.9–10.3)
Chloride: 109 mmol/L (ref 98–111)
Creatinine, Ser: 0.93 mg/dL (ref 0.44–1.00)
GFR, Estimated: >60 mL/min (ref >60)
Glucose, Bld: 109 mg/dL — ABNORMAL HIGH (ref 70–99)
Potassium: 3.5 mmol/L (ref 3.5–5.1)
Sodium: 138 mmol/L (ref 135–145)

## 2022-08-26 LAB — GLUCOSE, CAPILLARY
Glucose-Capillary: 78 mg/dL (ref 70–99)
Glucose-Capillary: 82 mg/dL (ref 70–99)
Glucose-Capillary: 114 mg/dL — ABNORMAL HIGH (ref 70–99)

## 2022-08-26 LAB — HEMOGLOBIN A1C
Hgb A1c MFr Bld: 7.2 % — ABNORMAL HIGH (ref 4.8–5.6)
Mean Plasma Glucose: 160 mg/dL

## 2022-08-26 MED ORDER — ONDANSETRON 4 MG PO TBDP
4.0000 mg | ORAL_TABLET | Freq: Once | ORAL | Status: AC
  Administered 2022-08-26: 4 mg via ORAL
  Filled 2022-08-26: qty 1

## 2022-08-26 MED ORDER — ONDANSETRON 4 MG PO TBDP
4.0000 mg | ORAL_TABLET | Freq: Three times a day (TID) | ORAL | 0 refills | Status: AC | PRN
  Filled 2022-08-26: qty 20, 7d supply, fill #0

## 2022-08-26 MED ORDER — HYDROCORTISONE (PERIANAL) 2.5 % EX CREA
TOPICAL_CREAM | Freq: Two times a day (BID) | CUTANEOUS | 0 refills | Status: AC
  Filled 2022-08-26: qty 30, 30d supply, fill #0

## 2022-08-26 NOTE — Progress Notes (Signed)
AVS reviewed with pt. All questions answered at bedside, with pt verbalizing understanding of discharge instructions. PIV removed and documented. Pt in NAD, and will be leaving via private vehicle with her husband.

## 2022-08-26 NOTE — Plan of Care (Signed)

## 2022-08-26 NOTE — Care Management (Signed)
  Transition of Care [facility]) Screening Note   Patient Details  Name: Stephney Ruzich Date of Birth: June 30, 1970   Transition of Care [facility]) CM/SW Contact:    Levonne Lapping, RN Phone Number: 08/26/2022, 11:39 AM    Transition of Care Department [facility]) has reviewed patient and no TOC needs have been identified at this time. We will continue to monitor patient advancement through interdisciplinary progression rounds. If new patient transition needs arise, please place a TOC consult.

## 2022-08-26 NOTE — Discharge Summary (Addendum)
Physician Discharge Summary  Brenda [name] [id number] DOB: 1971-03-14 DOA: 08/25/2022  PCP: Brenda [name]., MD  Admit date: 08/25/2022 Discharge date: 08/26/2022  Admitted From: Home  Discharge disposition: Home  Recommendations for Outpatient Follow-Up:   Follow up with your primary care provider in one week.  Check CBC, BMP, magnesium in the next visit   Discharge Diagnosis:   Principal Problem:   Gastroenteritis Active Problems:   Human immunodeficiency virus (HIV) disease ([place])   DM2 (diabetes mellitus, type 2) ([place])   Diarrhea   Gastritis   Discharge Condition: Improved.  Diet recommendation: Low sodium, heart healthy.  Carbohydrate-modified.   Wound care: None.  Code status: Full.   History of Present Illness:    Brenda [name] is a 52 y.o. female with medical history significant of HIV on HAART, diabetes mellitus insulin-dependent, hypertension, hyperlipidemia, hemorrhoids, presented to hospital with nausea vomiting and abdominal pain with diarrhea 5 to 6 days back.  She was recently diagnosed with oral thrush and was started on fluconazole.  She did drink some grape juice by mistake but does have a history of allergy to grape and since then started having abdominal pain and watery diarrhea.  In the ED, patient was afebrile but tachycardic.  CT scan of the abdomen showed small fat-containing periumbilical hernia but no other acute findings.  CBC and BMP was within normal range.  Patient continued to have nausea despite several rounds of Zofran so was admitted hospital for further evaluation and treatment.    Hospital Course:   Following conditions were addressed during hospitalization as listed below,  Intractable abdominal pain, nausea vomiting diarrhea. Improved at this time.  Likely viral gastroenteritis.  Influenza COVID and RSV was negative.  Urinalysis with glucose in urine.  She received supportive care with IV fluids antiemetics.   At this time symptoms have improved.  IDDM with hyperglycemia Patient did have poor oral intake.  Long-acting insulin was decreased from 50 twice daily to 50 units daily.  Hemoglobin A1c of 7.2.  Be resumed on home regimen on discharge.   Essential hypertension. Patient was severely dehydrated on presentation.  Hypertensives were on hold during hospitalization.  Has improved at this time.  Will resume home medication on discharge.    Diabetic neuropathy -Continue Lyrica   HIV/HIV positive/asymptomatic HIV infection status  -Has been compliant with HAART since 2007, last CD4> 200 and viral load undetected 4 months ago. Continue home HAART  Disposition.  At this time, patient is stable for disposition home with outpatient PCP follow-up.  Medical Consultants:   None.  Procedures:    None Subjective:   Today, patient seen and examined at bedside.  Denies any nausea vomiting fever chills or rigor.  Has tolerated oral diet.  Complains of mild back pain.  Discharge Exam:   Vitals:   08/26/22 0000 08/26/22 0829  BP: 138/73 (!) 156/67  Pulse: 98 91  Resp: 20 14  Temp: 98.7 F (37.1 C) 98.7 F (37.1 C)  SpO2: 91% 94%   Vitals:   08/25/22 1819 08/25/22 1900 08/26/22 0000 08/26/22 0829  BP: (!) 102/56 121/69 138/73 (!) 156/67  Pulse: (!) 111 95 98 91  Resp: 16 16 20 14   Temp: 99.4 F (37.4 C) 98.8 F (37.1 C) 98.7 F (37.1 C) 98.7 F (37.1 C)  TempSrc: Oral Oral Oral Oral  SpO2: 94% 93% 91% 94%  Weight:      Height:       Body mass index  is 30.41 kg/m.  General: Alert awake, not in obvious distress, obese built HENT: pupils equally reacting to light,  No scleral pallor or icterus noted. Oral mucosa is moist.  Chest:  Clear breath sounds.  Diminished breath sounds bilaterally. No crackles or wheezes.  CVS: S1 &S2 heard. No murmur.  Regular rate and rhythm. Abdomen: Soft, nontender, nondistended.  Bowel sounds are heard.  Left lumbar region tenderness. Extremities: No  cyanosis, clubbing or edema.  Peripheral pulses are palpable. Psych: Alert, awake and oriented, normal mood CNS:  No cranial nerve deficits.  Power equal in all extremities.   Skin: Warm and dry.  No rashes noted.  The results of significant diagnostics from this hospitalization (including imaging, microbiology, ancillary and laboratory) are listed below for reference.     Diagnostic Studies:   CT ABDOMEN PELVIS W CONTRAST  Result Date: 08/25/2022 CLINICAL DATA:  Acute generalized abdominal pain. EXAM: CT ABDOMEN AND PELVIS WITH CONTRAST TECHNIQUE: Multidetector CT imaging of the abdomen and pelvis was performed using the standard protocol following bolus administration of intravenous contrast. RADIATION DOSE REDUCTION: This exam was performed according to the departmental dose-optimization program which includes automated exposure control, adjustment of the mA and/or kV according to patient size and/or use of iterative reconstruction technique. CONTRAST:  65mL OMNIPAQUE IOHEXOL 350 MG/ML SOLN COMPARISON:  May 21, 2022. FINDINGS: Lower chest: No acute abnormality. Hepatobiliary: No focal liver abnormality is seen. No gallstones, gallbladder wall thickening, or biliary dilatation. Pancreas: Unremarkable. No pancreatic ductal dilatation or surrounding inflammatory changes. Spleen: Normal in size without focal abnormality. Adrenals/Urinary Tract: Adrenal glands are unremarkable. Kidneys are normal, without renal calculi, focal lesion, or hydronephrosis. Bladder is unremarkable. Stomach/Bowel: Stomach is within normal limits. Appendix appears normal. No evidence of bowel wall thickening, distention, or inflammatory changes. Vascular/Lymphatic: No significant vascular findings are present. No enlarged abdominal or pelvic lymph nodes. Reproductive: Uterus and bilateral adnexa are unremarkable. Other: Small fat containing periumbilical hernia is noted. No ascites is noted. Musculoskeletal: No acute or  significant osseous findings. IMPRESSION: Small fat containing periumbilical hernia. No significant abnormality seen in the abdomen or pelvis. Electronically Signed   By: Marijo Conception M.D.   On: 08/25/2022 10:32   DG Chest 2 View  Result Date: 08/25/2022 CLINICAL DATA:  Nausea and vomiting for 1 week, initial encounter EXAM: CHEST - 2 VIEW COMPARISON:  05/21/2022 CT FINDINGS: The heart size and mediastinal contours are within normal limits. Both lungs are clear. The visualized skeletal structures are unremarkable. IMPRESSION: No active cardiopulmonary disease. Electronically Signed   By: Inez Catalina M.D.   On: 08/25/2022 09:56     Labs:   Basic Metabolic Panel: Recent Labs  Lab 08/25/22 0814 08/26/22 0422  NA 140 138  K 3.9 3.5  CL 107 109  CO2 21* 23  GLUCOSE 216* 109*  BUN 11 12  CREATININE 1.00 0.93  CALCIUM 9.0 7.8*   GFR Estimated Creatinine Clearance: 82.4 mL/min (by C-G formula based on SCr of 0.93 mg/dL). Liver Function Tests: Recent Labs  Lab 08/25/22 0814  AST 29  ALT 20  ALKPHOS 87  BILITOT 0.3  PROT 7.7  ALBUMIN 3.8   Recent Labs  Lab 08/25/22 0814  LIPASE 72*   No results for input(s): "AMMONIA" in the last 168 hours. Coagulation profile No results for input(s): "INR", "PROTIME" in the last 168 hours.  CBC: Recent Labs  Lab 08/25/22 0814 08/26/22 0422  WBC 8.6 6.3  NEUTROABS 7.6  --   HGB  12.1 10.2*  HCT 40.6 33.3*  MCV 81.9 81.0  PLT 370 307   Cardiac Enzymes: No results for input(s): "CKTOTAL", "CKMB", "CKMBINDEX", "TROPONINI" in the last 168 hours. BNP: Invalid input(s): "POCBNP" CBG: Recent Labs  Lab 08/25/22 1827 08/25/22 2141 08/26/22 0832 08/26/22 1207  GLUCAP 112* 122* 78 82   D-Dimer No results for input(s): "DDIMER" in the last 72 hours. Hgb A1c Recent Labs    08/25/22 0814  HGBA1C 7.2*   Lipid Profile No results for input(s): "CHOL", "HDL", "LDLCALC", "TRIG", "CHOLHDL", "LDLDIRECT" in the last 72  hours. Thyroid function studies No results for input(s): "TSH", "T4TOTAL", "T3FREE", "THYROIDAB" in the last 72 hours.  Invalid input(s): "FREET3" Anemia work up No results for input(s): "VITAMINB12", "FOLATE", "FERRITIN", "TIBC", "IRON", "RETICCTPCT" in the last 72 hours. Microbiology Recent Results (from the past 240 hour(s))  Resp panel by RT-PCR (RSV, Flu A&B, Covid) Anterior Nasal Swab     Status: None   Collection Time: 08/25/22  8:09 AM   Specimen: Anterior Nasal Swab  Result Value Ref Range Status   SARS Coronavirus 2 by RT PCR NEGATIVE NEGATIVE Final   Influenza A by PCR NEGATIVE NEGATIVE Final   Influenza B by PCR NEGATIVE NEGATIVE Final    Comment: (NOTE) The Xpert Xpress SARS-CoV-2/FLU/RSV plus assay is intended as an aid in the diagnosis of influenza from Nasopharyngeal swab specimens and should not be used as a sole basis for treatment. Nasal washings and aspirates are unacceptable for Xpert Xpress SARS-CoV-2/FLU/RSV testing.  Fact Sheet for Patients: EntrepreneurPulse.com.au  Fact Sheet for Healthcare Providers: IncredibleEmployment.be  This test is not yet approved or cleared by the [place] FDA and has been authorized for detection and/or diagnosis of SARS-CoV-2 by FDA under an Emergency Use Authorization (EUA). This EUA will remain in effect (meaning this test can be used) for the duration of the COVID-19 declaration under Section 564(b)(1) of the Act, 21 U.S.C. section 360bbb-3(b)(1), unless the authorization is terminated or revoked.     Resp Syncytial Virus by PCR NEGATIVE NEGATIVE Final    Comment: (NOTE) Fact Sheet for Patients: EntrepreneurPulse.com.au  Fact Sheet for Healthcare Providers: IncredibleEmployment.be  This test is not yet approved or cleared by the [place] FDA and has been authorized for detection and/or diagnosis of SARS-CoV-2 by FDA under an Emergency  Use Authorization (EUA). This EUA will remain in effect (meaning this test can be used) for the duration of the COVID-19 declaration under Section 564(b)(1) of the Act, 21 U.S.C. section 360bbb-3(b)(1), unless the authorization is terminated or revoked.  Performed at [facility], [place] [address], [place], [place] [address]      Discharge Instructions:   Discharge Instructions     Diet - low sodium heart healthy   Complete by: As directed    Diet Carb Modified   Complete by: As directed    Discharge instructions   Complete by: As directed    Follow-up with your primary care in 1 week.  Check blood work at that time.  Increase water intake.  Take medications for nausea medicine if necessary.  Seek medical attention for worsening symptoms.   Increase activity slowly   Complete by: As directed       Allergies as of 08/26/2022       Reactions   Cortisone Itching, Other (See Comments)   REACTION: "nerves"   Hydrocodone-acetaminophen Itching, Other (See Comments)   REACTION: "nerves"   Morphine Itching, Other (See Comments)   REACTION: "nerves"  Banana Nausea And Vomiting, Other (See Comments)   Headaches    Other Hives, Rash   Red Grapes         Medication List     TAKE these medications    Accu-Chek Guide test strip Generic drug: glucose blood 3 (three) times daily. for testing   atorvastatin 10 MG tablet Commonly known as: Lipitor Take 1 tablet (10 mg total) by mouth daily.   baclofen 10 MG tablet Commonly known as: LIORESAL Take 10 mg by mouth 3 (three) times daily as needed for muscle spasms.   Cholecalciferol 1.25 MG (50000 UT) capsule Take 50,000 Units by mouth once a week.   diphenhydrAMINE 25 mg capsule Commonly known as: BENADRYL Take 50 mg by mouth daily as needed for allergies.   empagliflozin 25 MG Tabs tablet Commonly known as: JARDIANCE Take 25 mg by mouth daily.   Genvoya 150-150-200-10 MG Tabs tablet Generic drug:  elvitegravir-cobicistat-emtricitabine-tenofovir TAKE 1 TABLET BY MOUTH DAILY WITH BREAKFAST.   hydrocortisone 2.5 % rectal cream Commonly known as: ANUSOL-HC Place rectally 2 (two) times daily.   ibuprofen 800 MG tablet Commonly known as: ADVIL Take 800 mg by mouth 2 (two) times daily as needed for moderate pain.   insulin glargine 100 UNIT/ML injection Commonly known as: LANTUS Inject 60 Units into the skin daily. Lantus. Only for when Wylene Men is out of stock.   Semglee 100 UNIT/ML injection Generic drug: insulin glargine Inject 60 Units into the skin daily.   losartan 100 MG tablet Commonly known as: COZAAR Take 100 mg by mouth daily.   Lyrica 100 MG capsule Generic drug: pregabalin Take 100 mg by mouth daily.   metFORMIN 1000 MG tablet Commonly known as: GLUCOPHAGE Take 1 tablet (1,000 mg total) by mouth 2 (two) times daily. What changed: when to take this   ondansetron 4 MG disintegrating tablet Commonly known as: ZOFRAN-ODT Take 1 tablet (4 mg total) by mouth every 8 (eight) hours as needed for nausea or vomiting.   oxyCODONE-acetaminophen 10-325 MG tablet Commonly known as: PERCOCET Take 1 tablet by mouth 4 (four) times daily as needed for pain.   Ozempic (2 MG/DOSE) 8 MG/3ML Sopn Generic drug: Semaglutide (2 MG/DOSE) Inject 2 mg into the skin once a week.   promethazine 12.5 MG tablet Commonly known as: PHENERGAN Take 12.5 mg by mouth every 6 (six) hours as needed for vomiting or nausea.   sertraline 100 MG tablet Commonly known as: ZOLOFT Take 100 mg by mouth at bedtime.   traZODone 100 MG tablet Commonly known as: DESYREL Take 100 mg by mouth at bedtime.          Time coordinating discharge: 39 minutes  Signed:  Shemia Bevel  [facility] 08/26/2022, 1:31 PM

## 2022-10-26 ENCOUNTER — Encounter: Payer: Self-pay | Admitting: Internal Medicine

## 2022-10-26 ENCOUNTER — Ambulatory Visit (INDEPENDENT_AMBULATORY_CARE_PROVIDER_SITE_OTHER): Payer: 59 | Admitting: Internal Medicine

## 2022-10-26 VITALS — BP 126/70 | HR 95 | Ht 68.0 in | Wt 203.0 lb

## 2022-10-26 DIAGNOSIS — F1190 Opioid use, unspecified, uncomplicated: Secondary | ICD-10-CM

## 2022-10-26 DIAGNOSIS — K625 Hemorrhage of anus and rectum: Secondary | ICD-10-CM | POA: Diagnosis not present

## 2022-10-26 DIAGNOSIS — K5909 Other constipation: Secondary | ICD-10-CM

## 2022-10-26 MED ORDER — HYDROCORTISONE ACETATE 25 MG RE SUPP: 25.0000 mg | Freq: Every day | RECTAL | 0 refills | Status: AC

## 2022-10-26 MED ORDER — PLENVU 140 G PO SOLR: 1.0000 | ORAL | 0 refills | Status: DC

## 2022-10-26 NOTE — Progress Notes (Signed)
Brenda [name] 52 y.o. 1970-07-30 [id number]  Assessment & Plan:   Encounter Diagnoses  Name Primary?   Rectal bleeding - suspect hemorrhoidal, r/o other Yes   Chronic constipation    Opioid use    Treat hemorrhoids with hydrocortisone suppositories at this point.  Consider banding in the future pending results of colonoscopy which she needs to have.  Double prep MiraLAX-Plenvu.  Hold Ozempic a week before.  Consider OTC versus pharmacologic therapy for constipation pending results of colonoscopy.  Meds ordered this encounter  Medications   hydrocortisone (ANUSOL-HC) 25 MG suppository    Sig: Place 1 suppository (25 mg total) rectally at bedtime.    Dispense:  24 suppository    Refill:  0   PEG-KCl-NaCl-NaSulf-Na Asc-C (PLENVU) 140 g SOLR    Sig: Take 1 kit by mouth as directed.    Dispense:  1 each    Refill:  0      Subjective:   Chief Complaint: Hemorrhoids  HPI 52 year old African-American woman with HIV and hypertension and diabetes type 2 and chronic kidney disease, with a history of hemorrhoids, iron deficiency anemia who is complaining of bleeding and itching hemorrhoids still.  She takes iron for chronic iron deficiency anemia.  She has had workup in the past that has been unrevealing, it is thought that hemorrhoids and menses caused iron deficiency problems.  She had a colonoscopy last in 2023 but the prep was poor.  2020 colonoscopy with fair prep.  She has not returned for follow-up until now.  She does get constipated at times she moves her bowels about 3 times a week she is not using any particular laxatives at this point.  Sometimes she gets some left-sided low back pain when constipated.  Hemorrhoids will prolapse as well. Allergies  Allergen Reactions   Cortisone Itching and Other (See Comments)    REACTION: "nerves"   Hydrocodone-Acetaminophen Itching and Other (See Comments)    REACTION: "nerves"   Morphine Itching and Other (See Comments)     REACTION: "nerves"   Banana Nausea And Vomiting and Other (See Comments)    Headaches    Other Hives and Rash    Red Grapes    Current Meds  Medication Sig   ACCU-CHEK GUIDE test strip 3 (three) times daily. for testing   atorvastatin (LIPITOR) 10 MG tablet Take 1 tablet (10 mg total) by mouth daily.   baclofen (LIORESAL) 10 MG tablet Take 10 mg by mouth 3 (three) times daily as needed for muscle spasms.   Cholecalciferol 1.25 MG (50000 UT) capsule Take 50,000 Units by mouth once a week.   diphenhydrAMINE (BENADRYL) 25 mg capsule Take 50 mg by mouth daily as needed for allergies.   elvitegravir-cobicistat-emtricitabine-tenofovir (GENVOYA) 150-150-200-10 MG TABS tablet TAKE 1 TABLET BY MOUTH DAILY WITH BREAKFAST.   empagliflozin (JARDIANCE) 25 MG TABS tablet Take 25 mg by mouth daily.   hydrocortisone (ANUSOL-HC) 2.5 % rectal cream Place rectally 2 (two) times daily.   hydrocortisone (ANUSOL-HC) 25 MG suppository Place 1 suppository (25 mg total) rectally at bedtime.   ibuprofen (ADVIL) 800 MG tablet Take 800 mg by mouth 2 (two) times daily as needed for moderate pain.   insulin glargine (LANTUS) 100 UNIT/ML injection Inject 60 Units into the skin daily. Lantus. Only for when [name] is out of stock.   insulin glargine (SEMGLEE) 100 UNIT/ML injection Inject 60 Units into the skin daily.   losartan (COZAAR) 100 MG tablet Take 100 mg by mouth daily.  LYRICA 100 MG capsule Take 100 mg by mouth daily.   metFORMIN (GLUCOPHAGE) 1000 MG tablet Take 1 tablet (1,000 mg total) by mouth 2 (two) times daily. (Patient taking differently: Take 1,000 mg by mouth daily.)   ondansetron (ZOFRAN-ODT) 4 MG disintegrating tablet Take 1 tablet (4 mg total) by mouth every 8 (eight) hours as needed for nausea or vomiting.   oxyCODONE-acetaminophen (PERCOCET) 10-325 MG tablet Take 1 tablet by mouth 4 (four) times daily as needed for pain.   OZEMPIC, 2 MG/DOSE, 8 MG/3ML SOPN Inject 2 mg into the skin once a week.        promethazine (PHENERGAN) 12.5 MG tablet Take 12.5 mg by mouth every 6 (six) hours as needed for vomiting or nausea.   sertraline (ZOLOFT) 100 MG tablet Take 100 mg by mouth at bedtime.   traZODone (DESYREL) 100 MG tablet Take 100 mg by mouth at bedtime.   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 Stefano Gaul 2011   HYSTEROSCOPY WITH D & C N/A 04/12/2018   Procedure: DILATATION AND CURETTAGE /HYSTEROSCOPY;  Surgeon: Kirkland Hun, MD;  Location: [facility];  Service: Gynecology;  Laterality: N/A;   LAPAROSCOPIC LYSIS OF ADHESIONS  01/10/2014   Procedure: LAPAROSCOPIC LYSIS OF ADHESIONS;  Surgeon: Kirkland Hun, MD;  Location: [facility];  Service: Gynecology;;   LAPAROSCOPY N/A 01/10/2014   Procedure: LAPAROSCOPY OPERATIVE;  Surgeon: Kirkland Hun, MD;  Location: [facility];  Service: Gynecology;  Laterality: N/A;   TUBAL LIGATION     UMBILICAL HERNIA REPAIR  01/10/2014   Procedure: HERNIA REPAIR UMBILICAL ADULT;  Surgeon: Kirkland Hun, MD;  Location: [facility];  Service: Gynecology;;   WISDOM TOOTH EXTRACTION     Social History   Social History Narrative   Daily caffeine: 1 coffee/day   4 children, 3 at home youngest born 2006   2 grandchildren    Employed: Painting and  Education officer, environmental    Education: High school   Single         family history includes Alzheimer's disease in her paternal grandmother; Diabetes in her brother, father, maternal grandmother, and mother; Heart failure in her brother, father, and mother.   Review of Systems As per HPI  Objective:   Physical Exam @BP  126/70   Pulse 95   Ht 5\' 8"  (1.727 m)   Wt 203 lb (92.1 kg)   BMI 30.87 kg/m @  General:  NAD Eyes:   anicteric Lungs:  clear Heart::  S1S2 no rubs, murmurs  or gallops Abdomen:  soft and mildly tender LLQ BS+ Rectal:  Patti [place], CMA present.  Prolapsed hemorrhoids - gr 3 - reduced, + some external component, no mass Ext:   no edema, cyanosis or clubbing    Data Reviewed:  See HPI

## 2022-10-26 NOTE — Patient Instructions (Signed)
You have been scheduled for a colonoscopy. Please follow written instructions given to you at your visit today.  Please pick up your prep supplies at the pharmacy within the next 1-3 days. If you use inhalers (even only as needed), please bring them with you on the day of your procedure.   _______________________________________________________  If your blood pressure at your visit was 140/90 or greater, please contact your primary care physician to follow up on this.  _______________________________________________________  If you are age 80 or older, your body mass index should be between 23-30. Your Body mass index is 30.87 kg/m. If this is out of the aforementioned range listed, please consider follow up with your Primary Care Provider.  If you are age 42 or younger, your body mass index should be between 19-25. Your Body mass index is 30.87 kg/m. If this is out of the aformentioned range listed, please consider follow up with your Primary Care Provider.   ________________________________________________________  The [facility] providers would like to encourage you to use [facility] to communicate with providers for non-urgent requests or questions.  Due to long hold times on the telephone, sending your provider a message by [facility] may be a faster and more efficient way to get a response.  Please allow 48 business hours for a response.  Please remember that this is for non-urgent requests.  _______________________________________________________    I appreciate the opportunity to care for you. Denice Paradise, [facility]

## 2022-12-17 ENCOUNTER — Telehealth: Payer: Self-pay | Admitting: Internal Medicine

## 2022-12-17 MED ORDER — PLENVU 140 G PO SOLR: 1.0000 | ORAL | 0 refills | Status: DC

## 2022-12-17 NOTE — Telephone Encounter (Signed)
Brenda [name] said the pharmacy didn't have her Plenvu so I have resent it and confirmed the [facility] pharmacy on [facility]. We also went over the high lights of the instructions.

## 2022-12-17 NOTE — Telephone Encounter (Signed)
Inbound call from patient stating she needs prescription for prep for 7/19 colonoscopy to be sent again to her pharmacy. Please advise, thank you.

## 2022-12-20 ENCOUNTER — Telehealth: Payer: Self-pay

## 2022-12-20 NOTE — Telephone Encounter (Signed)
Received a fax from [facility] that Plenvu not covered. I called and left her a detailed message that I have put her a sample up front for pick up.

## 2022-12-24 ENCOUNTER — Encounter: Payer: Self-pay | Admitting: Internal Medicine

## 2022-12-24 ENCOUNTER — Ambulatory Visit (AMBULATORY_SURGERY_CENTER): Payer: 59 | Admitting: Internal Medicine

## 2022-12-24 ENCOUNTER — Other Ambulatory Visit: Payer: Self-pay

## 2022-12-24 ENCOUNTER — Encounter: Payer: Self-pay | Admitting: Pharmacist

## 2022-12-24 VITALS — BP 145/88 | HR 66 | Temp 98.2°F | Resp 15 | Ht 68.0 in | Wt 203.0 lb

## 2022-12-24 DIAGNOSIS — K5909 Other constipation: Secondary | ICD-10-CM

## 2022-12-24 DIAGNOSIS — K625 Hemorrhage of anus and rectum: Secondary | ICD-10-CM | POA: Diagnosis not present

## 2022-12-24 MED ORDER — LINACLOTIDE 290 MCG PO CAPS
290.0000 ug | ORAL_CAPSULE | Freq: Every day | ORAL | 11 refills | Status: AC
  Filled 2022-12-24: qty 30, 30d supply, fill #0

## 2022-12-24 MED ORDER — SODIUM CHLORIDE 0.9 % IV SOLN: 500.0000 mL | Freq: Once | INTRAVENOUS | Status: DC

## 2022-12-24 NOTE — Progress Notes (Unsigned)
 Uneventful propofol anesthetic. Report to pacu rn. Vss. Care resumed by rn.

## 2022-12-24 NOTE — Patient Instructions (Addendum)
The colonoscopy prep was still not good enough.  That said - I do think you have bleeding hemorrhoids but nothing bad going on.  We will make an appointment for you to get the hemorrhoids treated with banding. My office will call about that.  I sent a Linzess prescription to treat the constipation. Sent to [facility]  I appreciate the opportunity to care for you. Iva Boop, MD, FACG    YOU HAD AN ENDOSCOPIC PROCEDURE TODAY: Refer to the procedure report and other information in the discharge instructions given to you for any specific questions about what was found during the examination. If this information does not answer your questions, please call Dr. Marvell Fuller office at [phone] to clarify.   YOU SHOULD EXPECT: Some feelings of bloating in the abdomen. Passage of more gas than usual. Walking can help get rid of the air that was put into your GI tract during the procedure and reduce the bloating. If you had a lower endoscopy (such as a colonoscopy or flexible sigmoidoscopy) you may notice spotting of blood in your stool or on the toilet paper. Some abdominal soreness may be present for a day or two, also.  DIET: Your first meal following the procedure should be a light meal and then it is ok to progress to your normal diet. A half-sandwich or bowl of soup is an example of a good first meal. Heavy or fried foods are harder to digest and may make you feel nauseous or bloated. Drink plenty of fluids but you should avoid alcoholic beverages for 24 hours.   ACTIVITY: Your care partner should take you home directly after the procedure. You should plan to take it easy, moving slowly for the rest of the day. You can resume normal activity the day after the procedure however YOU SHOULD NOT DRIVE, use power tools, machinery or perform tasks that involve climbing or major physical exertion for 24 hours (because of the sedation medicines used during the test).   SYMPTOMS TO REPORT  IMMEDIATELY: A gastroenterologist can be reached at any hour. Please call [phone]  for any of the following symptoms:  Following lower endoscopy (colonoscopy, flexible sigmoidoscopy) Excessive amounts of blood in the stool  Significant tenderness, worsening of abdominal pains  Swelling of the abdomen that is new, acute  Fever of 100 or higher  Following upper endoscopy (EGD, EUS, ERCP, esophageal dilation) Vomiting of blood or coffee ground material  New, significant abdominal pain  New, significant chest pain or pain under the shoulder blades  Painful or persistently difficult swallowing  New shortness of breath  Black, tarry-looking or red, bloody stools  FOLLOW UP:  If any biopsies were taken you will be contacted by phone or by letter within the next 1-3 weeks. Call [phone]  if you have not heard about the biopsies in 3 weeks.  Please also call with any specific questions about appointments or follow up tests.

## 2022-12-24 NOTE — Progress Notes (Unsigned)
[facility] History and Physical   Primary Care Physician:  Frederich Chick., MD   Reason for Procedure:   Rectal bleeding  Plan:    colonoscopy     HPI: Brenda [name] is a 52 y.o. female seen 10/26/22 w/ hx rectal bleeding   with HIV and hypertension and diabetes type 2 and chronic kidney disease, with a history of hemorrhoids, iron deficiency anemia who is complaining of bleeding and itching hemorrhoids still. She takes iron for chronic iron deficiency anemia. She has had workup in the past that has been unrevealing, it is thought that hemorrhoids and menses caused iron deficiency problems. She had a colonoscopy last in 2023 but the prep was poor. 2020 colonoscopy with fair prep. She has not returned for follow-up until now. She does get constipated at times she moves her bowels about 3 times a week she is not using any particular laxatives at this point. Sometimes she gets some left-sided low back pain when constipated. Hemorrhoids will prolapse as well.    Past Surgical History:  Procedure Laterality Date   CHEST TUBE INSERTION     and removal of chest tube   COLONOSCOPY     HEMORRHOID BANDING     HYSTEROSCOPY WITH D & C  Removal of Endometrial Polyp   Dr. Stefano Gaul 2011   HYSTEROSCOPY WITH D & C N/A 04/12/2018   Procedure: DILATATION AND CURETTAGE /HYSTEROSCOPY;  Surgeon: Kirkland Hun, MD;  Location: [facility];  Service: Gynecology;  Laterality: N/A;   LAPAROSCOPIC LYSIS OF ADHESIONS  01/10/2014   Procedure: LAPAROSCOPIC LYSIS OF ADHESIONS;  Surgeon: Kirkland Hun, MD;  Location: [facility];  Service: Gynecology;;   LAPAROSCOPY N/A 01/10/2014   Procedure: LAPAROSCOPY OPERATIVE;  Surgeon: Kirkland Hun, MD;  Location: [facility];  Service: Gynecology;  Laterality: N/A;   TUBAL LIGATION     UMBILICAL HERNIA REPAIR  01/10/2014   Procedure: HERNIA REPAIR UMBILICAL ADULT;  Surgeon: Kirkland Hun, MD;  Location: [facility];  Service: Gynecology;;   WISDOM TOOTH  EXTRACTION      Prior to Admission medications   Medication Sig Start Date End Date Taking? Authorizing Provider  ACCU-CHEK GUIDE test strip 3 (three) times daily. for testing 10/18/18  Yes [provider]  atorvastatin (LIPITOR) 10 MG tablet Take 1 tablet (10 mg total) by mouth daily. 11/05/21  Yes Ginnie Smart, MD  clindamycin (CLEOCIN T) 1 % lotion SMARTSIG:Liberally Topical Daily 12/11/22  Yes [provider]  DULoxetine (CYMBALTA) 60 MG capsule Take 60 mg by mouth daily. 12/11/22  Yes [provider]  Elviteg-Cobic-Emtricit-TenofAF (GENVOYA PO) Take by mouth.   Yes [provider]  empagliflozin (JARDIANCE) 25 MG TABS tablet Take 25 mg by mouth daily.   Yes [provider]  Estradiol-Norethindrone Acet 0.5-0.1 MG tablet Take 1 tablet by mouth daily. 12/22/22  Yes [provider]  insulin glargine (LANTUS) 100 UNIT/ML injection Inject 60 Units into the skin daily. Lantus. Only for when [name] is out of stock.   Yes [provider]  insulin glargine (SEMGLEE) 100 UNIT/ML injection Inject 60 Units into the skin daily.   Yes [provider]  LANTUS SOLOSTAR 100 UNIT/ML Solostar Pen SMARTSIG:55 Unit(s) SUB-Q Daily   Yes [provider]  losartan (COZAAR) 100 MG tablet Take 100 mg by mouth daily. 10/09/18  Yes [provider]  oxyCODONE-acetaminophen (PERCOCET) 10-325 MG tablet Take 1 tablet by mouth 4 (four) times daily as needed for pain.   Yes [provider]  sertraline (ZOLOFT) 100 MG tablet Take 100 mg by mouth at bedtime.   Yes [provider]  traZODone (DESYREL) 100 MG tablet Take 100 mg by mouth at bedtime. 11/05/16  Yes [provider]  baclofen (LIORESAL) 10 MG tablet Take 10 mg by mouth 3 (three) times daily as needed for muscle spasms. 08/13/21   [provider]  Cholecalciferol 1.25 MG (50000 UT) capsule Take 50,000 Units by mouth once a week.    [provider]  diphenhydrAMINE (BENADRYL) 25 mg capsule Take 50 mg by mouth daily as needed for allergies.    [provider]  EPINEPHrine 0.3 mg/0.3 mL IJ SOAJ injection INJECT 0.3 ML (0.3 MG) BY INTRAMUSCULAR ROUTE ONCE AS NEEDED FOR ALLERGIC REACTION: MAY REPEAT DOSE IN 15 MINUTES IF NEEDED.    [provider]  hydrocortisone (ANUSOL-HC) 2.5 % rectal cream Place rectally 2 (two) times daily. 08/26/22   Pokhrel, Rebekah Chesterfield, MD  hydrocortisone (ANUSOL-HC) 25 MG suppository Place 1 suppository (25 mg total) rectally at bedtime. 10/26/22   Iva Boop, MD  ibuprofen (ADVIL) 800 MG tablet Take 800 mg by mouth 2 (two) times daily as needed for moderate pain. 07/10/21   [provider]  metFORMIN (GLUCOPHAGE) 1000 MG tablet Take 1 tablet (1,000 mg total) by mouth 2 (two) times daily. Patient not taking: Reported on 12/24/2022 11/12/21   Andrey Campanile, MD  ondansetron (ZOFRAN-ODT) 4 MG disintegrating tablet Take 1 tablet (4 mg total) by mouth every 8 (eight) hours as needed for nausea or vomiting. 08/26/22   Pokhrel, Laxman, MD  OZEMPIC, 2 MG/DOSE, 8 MG/3ML SOPN Inject 2 mg into the skin once a week. 08/17/21   [provider]  promethazine (PHENERGAN) 12.5 MG tablet Take 12.5 mg by mouth every 6 (six) hours as needed for vomiting or nausea. 07/24/22   [provider]    Current Outpatient Medications  Medication Sig Dispense Refill   ACCU-CHEK GUIDE test strip 3 (three) times daily. for testing     atorvastatin (LIPITOR) 10 MG tablet Take 1 tablet (10 mg total) by mouth daily. 90 tablet 3   clindamycin (CLEOCIN T) 1 % lotion SMARTSIG:Liberally Topical Daily     DULoxetine (CYMBALTA) 60 MG capsule Take 60 mg by mouth daily.     Elviteg-Cobic-Emtricit-TenofAF (GENVOYA PO) Take by mouth.     empagliflozin (JARDIANCE) 25 MG TABS tablet Take 25 mg by mouth daily.     Estradiol-Norethindrone Acet 0.5-0.1 MG tablet Take 1 tablet by mouth daily.     insulin  glargine (LANTUS) 100 UNIT/ML injection Inject 60 Units into the skin daily. Lantus. Only for when [name] is out of stock.     insulin glargine (SEMGLEE) 100 UNIT/ML injection Inject 60 Units into the skin daily.     LANTUS SOLOSTAR 100 UNIT/ML Solostar Pen SMARTSIG:55 Unit(s) SUB-Q Daily     losartan (COZAAR) 100 MG tablet Take 100 mg by mouth daily.     oxyCODONE-acetaminophen (PERCOCET) 10-325 MG tablet Take 1 tablet by mouth 4 (four) times daily as needed for pain.     sertraline (ZOLOFT) 100 MG tablet Take 100 mg by mouth at bedtime.     traZODone (DESYREL) 100 MG tablet Take 100 mg by mouth at bedtime.     baclofen (LIORESAL) 10 MG tablet Take 10 mg by mouth 3 (three) times daily as needed for muscle spasms.     Cholecalciferol 1.25 MG (50000 UT) capsule Take 50,000 Units by mouth once  a week.     diphenhydrAMINE (BENADRYL) 25 mg capsule Take 50 mg by mouth daily as needed for allergies.     EPINEPHrine 0.3 mg/0.3 mL IJ SOAJ injection INJECT 0.3 ML (0.3 MG) BY INTRAMUSCULAR ROUTE ONCE AS NEEDED FOR ALLERGIC REACTION: MAY REPEAT DOSE IN 15 MINUTES IF NEEDED.     hydrocortisone (ANUSOL-HC) 2.5 % rectal cream Place rectally 2 (two) times daily. 30 g 0   hydrocortisone (ANUSOL-HC) 25 MG suppository Place 1 suppository (25 mg total) rectally at bedtime. 24 suppository 0   ibuprofen (ADVIL) 800 MG tablet Take 800 mg by mouth 2 (two) times daily as needed for moderate pain.     metFORMIN (GLUCOPHAGE) 1000 MG tablet Take 1 tablet (1,000 mg total) by mouth 2 (two) times daily. (Patient not taking: Reported on 12/24/2022) 60 tablet 2   ondansetron (ZOFRAN-ODT) 4 MG disintegrating tablet Take 1 tablet (4 mg total) by mouth every 8 (eight) hours as needed for nausea or vomiting. 20 tablet 0   OZEMPIC, 2 MG/DOSE, 8 MG/3ML SOPN Inject 2 mg into the skin once a week.     promethazine (PHENERGAN) 12.5 MG tablet Take 12.5 mg by mouth every 6 (six) hours as needed for vomiting or nausea.     Current  Facility-Administered Medications  Medication Dose Route Frequency Provider Last Rate Last Admin   0.9 %  sodium chloride infusion  500 mL Intravenous Once Iva Boop, MD        Allergies as of 12/24/2022 - Review Complete 12/24/2022  Allergen Reaction Noted   Cortisone Itching and Other (See Comments)    Hydrocodone-acetaminophen Itching and Other (See Comments)    Morphine Itching and Other (See Comments)    Banana Nausea And Vomiting and Other (See Comments) 08/25/2022   Other Hives and Rash 08/25/2022    Family History  Problem Relation Age of Onset   Diabetes Mother    Heart failure Mother    Heart failure Father    Diabetes Father    Heart failure Brother    Diabetes Brother    Diabetes Maternal Grandmother    Alzheimer's disease Paternal Grandmother    Colon cancer Neg Hx    Colon polyps Neg Hx    Celiac disease Neg Hx    Crohn's disease Neg Hx    Esophageal cancer Neg Hx    Rectal cancer Neg Hx    Stomach cancer Neg Hx     Social History   Socioeconomic History   Marital status: Single    Spouse name: Not on file   Number of children: 4   Years of education: Not on file   Highest education level: Not on file  Occupational History   Occupation: [employer]  Tobacco Use   Smoking status: Never    Passive exposure: Never   Smokeless tobacco: Never  Vaping Use   Vaping status: Never Used  Substance and Sexual Activity   Alcohol use: No    Alcohol/week: 0.0 standard drinks of alcohol   Drug use: No   Sexual activity: Not Currently    Partners: Female    Birth control/protection: Condom, Surgical  Other Topics Concern   Not on file  Social History Narrative   Daily caffeine: 1 coffee/day   4 children, 3 at home youngest born 2006   2 grandchildren    Employed: Painting and Education officer, [employer]    Education: High school   Single         Social Determinants of  Health   Financial Resource Strain: Not on file  Food Insecurity: No Food Insecurity  (08/25/2022)   Hunger Vital Sign    Worried About Running Out of Food in the Last Year: Never true    Ran Out of Food in the Last Year: Never true  Transportation Needs: No Transportation Needs (08/25/2022)   PRAPARE - Administrator, Civil Service (Medical): No    Lack of Transportation (Non-Medical): No  Physical Activity: Not on file  Stress: Not on file  Social Connections: Not on file  Intimate Partner Violence: Not At Risk (08/25/2022)   Humiliation, Afraid, Rape, and Kick questionnaire    Fear of Current or Ex-Partner: No    Emotionally Abused: No    Physically Abused: No    Sexually Abused: No    Review of Systems:  All other review of systems negative except as mentioned in the HPI.  Physical Exam: Vital signs BP 132/76   Pulse 66   Temp 98.2 F (36.8 C) (Temporal)   Ht 5\' 8"  (1.727 m)   Wt 203 lb (92.1 kg)   LMP 09/24/2018 (Within Days)   SpO2 99%   BMI 30.87 kg/m   General:   Alert,  Well-developed, well-nourished, pleasant and cooperative in NAD Lungs:  Clear throughout to auscultation.   Heart:  Regular rate and rhythm; no murmurs, clicks, rubs,  or gallops. Abdomen:  Soft, nontender and nondistended. Normal bowel sounds.   Neuro/Psych:  Alert and cooperative. Normal mood and affect. A and O x 3   @[place]  Sena Slate, MD, [facility] [phone] (pager) 12/24/2022 11:03 AM@

## 2022-12-24 NOTE — Progress Notes (Unsigned)
Colonoscopy Brief Note  Indication - rectal bleeding prior colonoscopies with fair and poor preps   Scope # 238 Scope in 1119  Scope cecum 1122  Scope out 1125 Prep - Plenvu/MiraLax  Prep quality poor again  Findings other than poor prep - mod-large internal and external hemorrhoids seen on rectal + endoscopic view  Will arrange banding appointment  Meds ordered this encounter  Medications   0.9 %  sodium chloride infusion   linaclotide (LINZESS) 290 MCG CAPS capsule    Sig: Take 1 capsule (290 mcg total) by mouth daily before breakfast.    Dispense:  30 capsule    Refill:  11

## 2022-12-24 NOTE — Progress Notes (Unsigned)
Updated medical record.

## 2022-12-27 ENCOUNTER — Telehealth: Payer: Self-pay

## 2022-12-27 NOTE — Telephone Encounter (Signed)
-----   Message from Stan Head sent at 12/24/2022 11:44 AM EDT ----- Regarding: banding appt Needs a next avail banding appt

## 2022-12-27 NOTE — Telephone Encounter (Signed)
I left her a detailed message to call us back to book a banding appointment.

## 2022-12-27 NOTE — Op Note (Signed)
[facility] Patient Name: Brenda [name] Procedure Date: 12/26/2022 11:19 AM MRN: [id number] Endoscopist: Iva Boop , MD, [id number] Age: 52 Referring MD:  Date of Birth: December 17, 1970 Gender: Female Account #: [id number] Procedure:                Colonoscopy Indications:              Rectal bleeding Medicines:                Monitored Anesthesia Care Procedure:                Pre-Anesthesia Assessment:                           - Prior to the procedure, a History and Physical                            was performed, and patient medications and                            allergies were reviewed. The patient's tolerance of                            previous anesthesia was also reviewed. The risks                            and benefits of the procedure and the sedation                            options and risks were discussed with the patient.                            All questions were answered, and informed consent                            was obtained. Prior Anticoagulants: The patient has                            taken no anticoagulant or antiplatelet agents. ASA                            Grade Assessment: II - A patient with mild systemic                            disease. After reviewing the risks and benefits,                            the patient was deemed in satisfactory condition to                            undergo the procedure.                           After obtaining informed consent, the colonoscope  was passed under direct vision. Throughout the                            procedure, the patient's blood pressure, pulse, and                            oxygen saturations were monitored continuously. The                            PCF-HQ190L Colonoscope [id number] was introduced                            through the anus and advanced to the the cecum,                            identified by appendiceal orifice and ileocecal                             valve. The colonoscopy was performed with                            difficulty due to inadequate bowel prep. The                            patient tolerated the procedure well. The quality                            of the bowel preparation was poor. No anatomical                            landmarks were photographed. The bowel preparation                            used was Miralax via extended prep with split dose                            instruction. The bowel preparation used was Plenvu                            via extended prep with split dose instruction.                            Scope insertion time was 3 minutes. Scope                            withdrawal time was 3 minutes. Findings:                 Hemorrhoids were found on perianal exam.                           A large amount of stool was found in the entire  colon, interfering with visualization.                           External and internal hemorrhoids were found. The                            hemorrhoids were large. Complications:            No immediate complications. Estimated Blood Loss:     Estimated blood loss: none. Impression:               - Preparation of the colon was poor.                           - Hemorrhoids found on perianal exam.                           - Stool in the entire examined colon.                           - External and internal hemorrhoids.                           - No specimens collected. Recommendation:           - Patient has a contact number available for                            emergencies. The signs and symptoms of potential                            delayed complications were discussed with the                            patient. Return to normal activities tomorrow.                            Written discharge instructions were provided to the                            patient.                           -  Linzess 290 ug daily                           Schedule hemorrhoid banding appointment - I believe                            these are bleeding - was trying to exclude other                            causes but 3rd colonoscopy over recent years with                            inadequate prep - and she had double prep  MiraLax/Plenvu this time Iva Boop, MD 12/26/2022 11:25:22 AM This report has been signed electronically.

## 2022-12-27 NOTE — Telephone Encounter (Signed)
  Follow up Call-     12/24/2022   10:31 AM 09/15/2021    8:49 AM  Call back number  Post procedure Call Back phone  # [phone]  Permission to leave phone message Yes Yes     Patient questions:  Do you have a fever, pain , or abdominal swelling? No. Pain Score  0 *  Have you tolerated food without any problems? Yes.    Have you been able to return to your normal activities? Yes.    Do you have any questions about your discharge instructions: Diet   No. Medications  No. Follow up visit  No.  Do you have questions or concerns about your Care? No.  Actions: * If pain score is 4 or above: No action needed, pain <4.

## 2022-12-29 ENCOUNTER — Other Ambulatory Visit: Payer: Self-pay

## 2023-01-07 NOTE — Telephone Encounter (Signed)
I was able to reach [facility] and set her up for 02/23/2023 at 3:10pm for a banding appt.

## 2023-02-23 ENCOUNTER — Encounter: Payer: Self-pay | Admitting: Internal Medicine

## 2023-02-23 ENCOUNTER — Ambulatory Visit (INDEPENDENT_AMBULATORY_CARE_PROVIDER_SITE_OTHER): Payer: 59 | Admitting: Internal Medicine

## 2023-02-23 VITALS — BP 138/80 | HR 87 | Ht 68.0 in | Wt 205.0 lb

## 2023-02-23 DIAGNOSIS — K642 Third degree hemorrhoids: Secondary | ICD-10-CM

## 2023-02-23 NOTE — Patient Instructions (Addendum)
_______________________________________________________  If your blood pressure at your visit was 140/90 or greater, please contact your primary care physician to follow up on this.  _______________________________________________________  If you are age 54 or older, your body mass index should be between 23-30. Your Body mass index is 31.17 kg/m. If this is out of the aforementioned range listed, please consider follow up with your Primary Care Provider.  If you are age 46 or younger, your body mass index should be between 19-25. Your Body mass index is 31.17 kg/m. If this is out of the aformentioned range listed, please consider follow up with your Primary Care Provider.   ________________________________________________________  The [facility] providers would like to encourage you to use [facility] to communicate with providers for non-urgent requests or questions.  Due to long hold times on the telephone, sending your provider a message by [facility] may be a faster and more efficient way to get a response.  Please allow 48 business hours for a response.  Please remember that this is for non-urgent requests.  _______________________________________________________  Brenda [name] have been scheduled for an appointment with Dr. Leone Payor on 04-12-2023 at 3:10pm . Please arrive 10 minutes early for your appointment.  Continue with your Miralax please   HEMORRHOID BANDING PROCEDURE    FOLLOW-UP CARE   The procedure you have had should have been relatively painless since the banding of the area involved does not have nerve endings and there is no pain sensation.  The rubber band cuts off the blood supply to the hemorrhoid and the band may fall off as soon as 48 hours after the banding (the band may occasionally be seen in the toilet bowl following a bowel movement). You may notice a temporary feeling of fullness in the rectum which should respond adequately to plain Tylenol or Motrin.  Following the  banding, avoid strenuous exercise that evening and resume full activity the next day.  A sitz bath (soaking in a warm tub) or bidet is soothing, and can be useful for cleansing the area after bowel movements.     To avoid constipation, take two tablespoons of natural wheat bran, natural oat bran, flax, Benefiber or any over the counter fiber supplement and increase your water intake to 7-8 glasses daily.    Unless you have been prescribed anorectal medication, do not put anything inside your rectum for two weeks: No suppositories, enemas, fingers, etc.  Occasionally, you may have more bleeding than usual after the banding procedure.  This is often from the untreated hemorrhoids rather than the treated one.  Don't be concerned if there is a tablespoon or so of blood.  If there is more blood than this, lie flat with your bottom higher than your head and apply an ice pack to the area. If the bleeding does not stop within a half an hour or if you feel faint, call our office at [phone] or go to the emergency room.  Problems are not common; however, if there is a substantial amount of bleeding, severe pain, chills, fever or difficulty passing urine (very rare) or other problems, you should call us at [phone] or report to the nearest emergency room.  Do not stay seated continuously for more than 2-3 hours for a day or two after the procedure.  Tighten your buttock muscles 10-15 times every two hours and take 10-15 deep breaths every 1-2 hours.  Do not spend more than a few minutes on the toilet if you cannot empty your  bowel; instead re-visit the toilet at a later time.   It was a pleasure to see you today!  Thank you for trusting me with your gastrointestinal care!

## 2023-02-23 NOTE — Progress Notes (Signed)
HEMORRHOID LIGATION  Indication: bleeding and hx iron-def anemia, prolapse   Brooke Harp CMA present  Rectal - Gr 3 RA>RP hemorrhoids int/ext PROCEDURE NOTE: The patient presents with symptomatic grade 3  hemorrhoids, requesting rubber band ligation of his/her hemorrhoidal disease.  All risks, benefits and alternative forms of therapy were described and informed consent was obtained.   The anorectum was pre-medicated with 0.125% NTG and 5% lidocaine The decision was made to band the RP and RA internal hemorrhoid, and the [facility] System was used to perform band ligation without complication.  Digital anorectal examination was then performed to assure proper positioning of the band, and to adjust the banded tissue as required.  The patient was discharged home without pain or other issues.  Dietary and behavioral recommendations were given and along with follow-up instructions.     The following adjunctive treatments were recommended:  Stay on daily Linzess  The patient will return in 2 mos for  follow-up and possible additional banding as required. No complications were encountered and the patient tolerated the procedure well.

## 2023-03-08 DEATH — deceased

## 2023-04-05 ENCOUNTER — Encounter: Payer: Self-pay | Admitting: Hematology and Oncology

## 2023-04-12 ENCOUNTER — Encounter: Payer: Medicaid Other | Admitting: Internal Medicine

## 2023-07-13 ENCOUNTER — Encounter: Payer: Self-pay | Admitting: Hematology and Oncology

## 2023-07-14 ENCOUNTER — Encounter: Payer: Self-pay | Admitting: Hematology and Oncology

## 2023-08-04 ENCOUNTER — Encounter: Payer: Self-pay | Admitting: Hematology and Oncology
